# Patient Record
Sex: Female | Born: 1935 | Race: White | Hispanic: No | Smoking: Never smoker
Health system: Southern US, Community
[De-identification: ages and names within clinical notes are randomized; demographics above are authoritative.]

## PROBLEM LIST (undated history)

## (undated) DIAGNOSIS — C801 Malignant (primary) neoplasm, unspecified: Secondary | ICD-10-CM

## (undated) DIAGNOSIS — I2510 Atherosclerotic heart disease of native coronary artery without angina pectoris: Secondary | ICD-10-CM

## (undated) DIAGNOSIS — E7800 Pure hypercholesterolemia, unspecified: Secondary | ICD-10-CM

## (undated) DIAGNOSIS — N189 Chronic kidney disease, unspecified: Secondary | ICD-10-CM

## (undated) DIAGNOSIS — R002 Palpitations: Secondary | ICD-10-CM

## (undated) DIAGNOSIS — R011 Cardiac murmur, unspecified: Secondary | ICD-10-CM

## (undated) DIAGNOSIS — K219 Gastro-esophageal reflux disease without esophagitis: Secondary | ICD-10-CM

## (undated) DIAGNOSIS — K449 Diaphragmatic hernia without obstruction or gangrene: Secondary | ICD-10-CM

## (undated) DIAGNOSIS — M109 Gout, unspecified: Secondary | ICD-10-CM

## (undated) DIAGNOSIS — I10 Essential (primary) hypertension: Secondary | ICD-10-CM

## (undated) HISTORY — PX: OTHER SURGICAL HISTORY: SHX169

## (undated) HISTORY — PX: MASTECTOMY: SHX3

## (undated) HISTORY — PX: LOBECTOMY: SHX5089

## (undated) HISTORY — PX: APPENDECTOMY: SHX54

---

## 2003-05-21 ENCOUNTER — Other Ambulatory Visit: Payer: Self-pay

## 2004-01-25 ENCOUNTER — Ambulatory Visit: Payer: Self-pay | Admitting: Internal Medicine

## 2004-02-01 ENCOUNTER — Emergency Department: Payer: Self-pay | Admitting: Emergency Medicine

## 2004-02-01 ENCOUNTER — Other Ambulatory Visit: Payer: Self-pay

## 2004-04-23 ENCOUNTER — Ambulatory Visit: Payer: Self-pay | Admitting: Family Medicine

## 2004-04-29 ENCOUNTER — Inpatient Hospital Stay: Payer: Self-pay | Admitting: Internal Medicine

## 2004-05-21 ENCOUNTER — Inpatient Hospital Stay: Payer: Self-pay | Admitting: Internal Medicine

## 2004-05-23 ENCOUNTER — Ambulatory Visit: Payer: Self-pay | Admitting: Internal Medicine

## 2004-06-19 ENCOUNTER — Inpatient Hospital Stay (HOSPITAL_COMMUNITY): Admission: AD | Admit: 2004-06-19 | Discharge: 2004-06-22 | Payer: Self-pay | Admitting: Cardiology

## 2004-06-19 ENCOUNTER — Ambulatory Visit: Payer: Self-pay | Admitting: Cardiovascular Disease

## 2004-06-23 ENCOUNTER — Inpatient Hospital Stay (HOSPITAL_COMMUNITY): Admission: EM | Admit: 2004-06-23 | Discharge: 2004-06-26 | Payer: Self-pay | Admitting: *Deleted

## 2004-09-23 ENCOUNTER — Ambulatory Visit: Payer: Self-pay | Admitting: Internal Medicine

## 2004-11-30 ENCOUNTER — Emergency Department: Payer: Self-pay | Admitting: Emergency Medicine

## 2004-12-02 ENCOUNTER — Ambulatory Visit: Payer: Self-pay | Admitting: General Surgery

## 2004-12-02 ENCOUNTER — Ambulatory Visit: Payer: Self-pay | Admitting: Emergency Medicine

## 2005-01-27 ENCOUNTER — Ambulatory Visit: Payer: Self-pay | Admitting: Internal Medicine

## 2005-02-24 ENCOUNTER — Ambulatory Visit: Payer: Self-pay | Admitting: Internal Medicine

## 2005-07-26 ENCOUNTER — Ambulatory Visit: Payer: Self-pay | Admitting: Internal Medicine

## 2005-08-24 ENCOUNTER — Ambulatory Visit: Payer: Self-pay | Admitting: Internal Medicine

## 2005-11-29 ENCOUNTER — Ambulatory Visit: Payer: Self-pay | Admitting: General Surgery

## 2006-01-19 ENCOUNTER — Ambulatory Visit: Payer: Self-pay | Admitting: Internal Medicine

## 2006-01-24 ENCOUNTER — Ambulatory Visit: Payer: Self-pay | Admitting: Internal Medicine

## 2006-06-22 ENCOUNTER — Ambulatory Visit: Payer: Self-pay | Admitting: *Deleted

## 2006-07-09 ENCOUNTER — Emergency Department: Payer: Self-pay | Admitting: General Practice

## 2006-07-18 ENCOUNTER — Ambulatory Visit: Payer: Self-pay | Admitting: Internal Medicine

## 2006-07-26 ENCOUNTER — Ambulatory Visit: Payer: Self-pay | Admitting: Internal Medicine

## 2006-11-30 ENCOUNTER — Ambulatory Visit: Payer: Self-pay | Admitting: General Surgery

## 2007-03-27 ENCOUNTER — Ambulatory Visit: Payer: Self-pay | Admitting: Internal Medicine

## 2007-04-12 ENCOUNTER — Ambulatory Visit: Payer: Self-pay | Admitting: Internal Medicine

## 2007-04-27 ENCOUNTER — Ambulatory Visit: Payer: Self-pay | Admitting: Internal Medicine

## 2007-06-25 ENCOUNTER — Ambulatory Visit: Payer: Self-pay | Admitting: Internal Medicine

## 2007-09-25 ENCOUNTER — Ambulatory Visit: Payer: Self-pay | Admitting: Internal Medicine

## 2007-10-09 ENCOUNTER — Ambulatory Visit: Payer: Self-pay | Admitting: Internal Medicine

## 2007-10-25 ENCOUNTER — Ambulatory Visit: Payer: Self-pay | Admitting: Internal Medicine

## 2007-11-27 ENCOUNTER — Ambulatory Visit: Payer: Self-pay | Admitting: General Surgery

## 2008-01-25 ENCOUNTER — Ambulatory Visit: Payer: Self-pay | Admitting: Internal Medicine

## 2008-03-14 ENCOUNTER — Ambulatory Visit: Payer: Self-pay | Admitting: Family Medicine

## 2008-06-24 ENCOUNTER — Ambulatory Visit: Payer: Self-pay | Admitting: Internal Medicine

## 2008-06-26 ENCOUNTER — Ambulatory Visit: Payer: Self-pay | Admitting: Internal Medicine

## 2008-07-25 ENCOUNTER — Ambulatory Visit: Payer: Self-pay | Admitting: Internal Medicine

## 2008-09-18 ENCOUNTER — Ambulatory Visit: Payer: Self-pay | Admitting: Nephrology

## 2008-12-02 ENCOUNTER — Ambulatory Visit: Payer: Self-pay | Admitting: General Surgery

## 2009-06-10 ENCOUNTER — Ambulatory Visit: Payer: Self-pay | Admitting: Family Medicine

## 2009-08-10 ENCOUNTER — Inpatient Hospital Stay: Payer: Self-pay | Admitting: Internal Medicine

## 2009-12-03 ENCOUNTER — Ambulatory Visit: Payer: Self-pay

## 2011-03-11 ENCOUNTER — Ambulatory Visit: Payer: Self-pay | Admitting: Physician Assistant

## 2011-03-26 ENCOUNTER — Inpatient Hospital Stay: Payer: Self-pay | Admitting: Internal Medicine

## 2011-05-15 ENCOUNTER — Ambulatory Visit: Payer: Self-pay | Admitting: Cardiovascular Disease

## 2011-05-30 ENCOUNTER — Emergency Department: Payer: Self-pay | Admitting: Internal Medicine

## 2011-05-30 LAB — CBC WITH DIFFERENTIAL/PLATELET
Basophil #: 0.1 10*3/uL (ref 0.0–0.1)
Eosinophil #: 0.4 10*3/uL (ref 0.0–0.7)
HCT: 36.8 % (ref 35.0–47.0)
Lymphocyte #: 1.1 10*3/uL (ref 1.0–3.6)
Lymphocyte %: 11 %
MCHC: 33.3 g/dL (ref 32.0–36.0)
MCV: 89 fL (ref 80–100)
Neutrophil #: 7.1 10*3/uL — ABNORMAL HIGH (ref 1.4–6.5)
Platelet: 187 10*3/uL (ref 150–440)
RDW: 15.2 % — ABNORMAL HIGH (ref 11.5–14.5)
WBC: 9.6 10*3/uL (ref 3.6–11.0)

## 2011-05-30 LAB — CK TOTAL AND CKMB (NOT AT ARMC): CK, Total: 51 U/L (ref 21–215)

## 2011-05-30 LAB — COMPREHENSIVE METABOLIC PANEL
Albumin: 3.6 g/dL (ref 3.4–5.0)
Anion Gap: 12 (ref 7–16)
BUN: 43 mg/dL — ABNORMAL HIGH (ref 7–18)
Calcium, Total: 10.6 mg/dL — ABNORMAL HIGH (ref 8.5–10.1)
Glucose: 101 mg/dL — ABNORMAL HIGH (ref 65–99)
Potassium: 4 mmol/L (ref 3.5–5.1)
SGOT(AST): 18 U/L (ref 15–37)
SGPT (ALT): 17 U/L
Total Protein: 8.3 g/dL — ABNORMAL HIGH (ref 6.4–8.2)

## 2011-06-23 ENCOUNTER — Ambulatory Visit: Payer: Self-pay | Admitting: Nephrology

## 2011-06-24 ENCOUNTER — Inpatient Hospital Stay: Payer: Self-pay | Admitting: Physician Assistant

## 2011-06-24 LAB — CBC WITH DIFFERENTIAL/PLATELET
Basophil #: 0 10*3/uL (ref 0.0–0.1)
Eosinophil #: 0.4 10*3/uL (ref 0.0–0.7)
HCT: 28.6 % — ABNORMAL LOW (ref 35.0–47.0)
Lymphocyte #: 0.9 10*3/uL — ABNORMAL LOW (ref 1.0–3.6)
MCHC: 34 g/dL (ref 32.0–36.0)
MCV: 89 fL (ref 80–100)
Monocyte #: 1 10*3/uL — ABNORMAL HIGH (ref 0.0–0.7)
Neutrophil #: 9.8 10*3/uL — ABNORMAL HIGH (ref 1.4–6.5)
Platelet: 271 10*3/uL (ref 150–440)
RDW: 14.6 % — ABNORMAL HIGH (ref 11.5–14.5)

## 2011-06-24 LAB — URINALYSIS, COMPLETE
Bilirubin,UR: NEGATIVE
Ketone: NEGATIVE
Protein: 30
RBC,UR: 188 /HPF (ref 0–5)
Squamous Epithelial: NONE SEEN
WBC UR: 684 /HPF (ref 0–5)

## 2011-06-24 LAB — COMPREHENSIVE METABOLIC PANEL
Albumin: 2.8 g/dL — ABNORMAL LOW (ref 3.4–5.0)
Anion Gap: 11 (ref 7–16)
BUN: 77 mg/dL — ABNORMAL HIGH (ref 7–18)
Calcium, Total: 9.2 mg/dL (ref 8.5–10.1)
Glucose: 95 mg/dL (ref 65–99)
Osmolality: 304 (ref 275–301)
Potassium: 5 mmol/L (ref 3.5–5.1)
Sodium: 141 mmol/L (ref 136–145)
Total Protein: 7.5 g/dL (ref 6.4–8.2)

## 2011-06-25 LAB — COMPREHENSIVE METABOLIC PANEL
Albumin: 2.2 g/dL — ABNORMAL LOW (ref 3.4–5.0)
Anion Gap: 9 (ref 7–16)
Bilirubin,Total: 0.2 mg/dL (ref 0.2–1.0)
Chloride: 112 mmol/L — ABNORMAL HIGH (ref 98–107)
Co2: 20 mmol/L — ABNORMAL LOW (ref 21–32)
Creatinine: 2.88 mg/dL — ABNORMAL HIGH (ref 0.60–1.30)
EGFR (African American): 20 — ABNORMAL LOW
Sodium: 141 mmol/L (ref 136–145)
Total Protein: 6 g/dL — ABNORMAL LOW (ref 6.4–8.2)

## 2011-06-25 LAB — CBC WITH DIFFERENTIAL/PLATELET
Basophil #: 0.1 10*3/uL (ref 0.0–0.1)
Eosinophil %: 3.9 %
HCT: 23.4 % — ABNORMAL LOW (ref 35.0–47.0)
Lymphocyte #: 0.9 10*3/uL — ABNORMAL LOW (ref 1.0–3.6)
MCH: 30.1 pg (ref 26.0–34.0)
MCV: 88 fL (ref 80–100)
Monocyte %: 9 %
Neutrophil #: 7.8 10*3/uL — ABNORMAL HIGH (ref 1.4–6.5)
RDW: 14.1 % (ref 11.5–14.5)
WBC: 10.1 10*3/uL (ref 3.6–11.0)

## 2011-06-25 LAB — IRON AND TIBC: Iron Saturation: 16 %

## 2011-06-26 LAB — CBC WITH DIFFERENTIAL/PLATELET
Eosinophil #: 0.3 10*3/uL (ref 0.0–0.7)
HCT: 24 % — ABNORMAL LOW (ref 35.0–47.0)
Lymphocyte #: 0.8 10*3/uL — ABNORMAL LOW (ref 1.0–3.6)
Lymphocyte %: 7.7 %
MCHC: 33.6 g/dL (ref 32.0–36.0)
Monocyte %: 8.2 %
Neutrophil #: 8.1 10*3/uL — ABNORMAL HIGH (ref 1.4–6.5)
Platelet: 190 10*3/uL (ref 150–440)
RDW: 13.8 % (ref 11.5–14.5)
WBC: 10.1 10*3/uL (ref 3.6–11.0)

## 2011-06-26 LAB — BASIC METABOLIC PANEL
Anion Gap: 8 (ref 7–16)
Chloride: 115 mmol/L — ABNORMAL HIGH (ref 98–107)
Co2: 20 mmol/L — ABNORMAL LOW (ref 21–32)
Creatinine: 2.29 mg/dL — ABNORMAL HIGH (ref 0.60–1.30)
Osmolality: 298 (ref 275–301)
Potassium: 5.2 mmol/L — ABNORMAL HIGH (ref 3.5–5.1)

## 2011-06-27 LAB — BASIC METABOLIC PANEL
Calcium, Total: 8.4 mg/dL — ABNORMAL LOW (ref 8.5–10.1)
Chloride: 118 mmol/L — ABNORMAL HIGH (ref 98–107)
Co2: 21 mmol/L (ref 21–32)
EGFR (African American): 33 — ABNORMAL LOW
Potassium: 4.4 mmol/L (ref 3.5–5.1)

## 2011-06-27 LAB — CBC WITH DIFFERENTIAL/PLATELET
Basophil #: 0 10*3/uL (ref 0.0–0.1)
HCT: 23 % — ABNORMAL LOW (ref 35.0–47.0)
Lymphocyte #: 0.8 10*3/uL — ABNORMAL LOW (ref 1.0–3.6)
MCH: 29.9 pg (ref 26.0–34.0)
MCV: 89 fL (ref 80–100)
Monocyte #: 0.7 10*3/uL (ref 0.0–0.7)
Monocyte %: 8.6 %
Neutrophil #: 6.2 10*3/uL (ref 1.4–6.5)
Platelet: 179 10*3/uL (ref 150–440)
RBC: 2.57 10*6/uL — ABNORMAL LOW (ref 3.80–5.20)
RDW: 14.2 % (ref 11.5–14.5)
WBC: 7.9 10*3/uL (ref 3.6–11.0)

## 2011-06-28 LAB — BASIC METABOLIC PANEL
Calcium, Total: 8.7 mg/dL (ref 8.5–10.1)
Chloride: 115 mmol/L — ABNORMAL HIGH (ref 98–107)
Creatinine: 1.71 mg/dL — ABNORMAL HIGH (ref 0.60–1.30)
EGFR (Non-African Amer.): 30 — ABNORMAL LOW
Glucose: 79 mg/dL (ref 65–99)
Osmolality: 298 (ref 275–301)
Potassium: 4.2 mmol/L (ref 3.5–5.1)

## 2011-06-28 LAB — CBC WITH DIFFERENTIAL/PLATELET
Basophil %: 0.6 %
Eosinophil #: 0.3 10*3/uL (ref 0.0–0.7)
Eosinophil %: 4.1 %
HCT: 30.1 % — ABNORMAL LOW (ref 35.0–47.0)
HGB: 10.2 g/dL — ABNORMAL LOW (ref 12.0–16.0)
Lymphocyte #: 1 10*3/uL (ref 1.0–3.6)
MCH: 29.8 pg (ref 26.0–34.0)
MCHC: 34 g/dL (ref 32.0–36.0)
MCV: 88 fL (ref 80–100)
Monocyte #: 0.8 10*3/uL — ABNORMAL HIGH (ref 0.0–0.7)
Neutrophil #: 5.1 10*3/uL (ref 1.4–6.5)
RBC: 3.43 10*6/uL — ABNORMAL LOW (ref 3.80–5.20)
WBC: 7.3 10*3/uL (ref 3.6–11.0)

## 2011-06-29 LAB — BASIC METABOLIC PANEL
Anion Gap: 7 (ref 7–16)
BUN: 22 mg/dL — ABNORMAL HIGH (ref 7–18)
Calcium, Total: 8.8 mg/dL (ref 8.5–10.1)
Chloride: 107 mmol/L (ref 98–107)
Creatinine: 1.61 mg/dL — ABNORMAL HIGH (ref 0.60–1.30)
EGFR (Non-African Amer.): 32 — ABNORMAL LOW
Osmolality: 282 (ref 275–301)
Potassium: 3.9 mmol/L (ref 3.5–5.1)

## 2011-06-29 LAB — CBC WITH DIFFERENTIAL/PLATELET
Eosinophil #: 0.3 10*3/uL (ref 0.0–0.7)
Eosinophil %: 3.7 %
HCT: 29.7 % — ABNORMAL LOW (ref 35.0–47.0)
Lymphocyte #: 0.9 10*3/uL — ABNORMAL LOW (ref 1.0–3.6)
Lymphocyte %: 11.4 %
MCV: 89 fL (ref 80–100)
Monocyte %: 12 %
Neutrophil #: 6 10*3/uL (ref 1.4–6.5)
Platelet: 144 10*3/uL — ABNORMAL LOW (ref 150–440)
RBC: 3.35 10*6/uL — ABNORMAL LOW (ref 3.80–5.20)
RDW: 13.8 % (ref 11.5–14.5)
WBC: 8.3 10*3/uL (ref 3.6–11.0)

## 2011-06-30 LAB — CULTURE, BLOOD (SINGLE)

## 2011-07-07 ENCOUNTER — Ambulatory Visit: Payer: Self-pay | Admitting: Urology

## 2011-07-07 LAB — PROTIME-INR
INR: 1
Prothrombin Time: 13.9 secs (ref 11.5–14.7)

## 2011-07-11 ENCOUNTER — Ambulatory Visit: Payer: Self-pay | Admitting: Urology

## 2011-07-11 LAB — URINALYSIS, COMPLETE
Bilirubin,UR: NEGATIVE
Ketone: NEGATIVE
Ph: 5 (ref 4.5–8.0)
Protein: 100
Squamous Epithelial: 5

## 2011-07-11 LAB — COMPREHENSIVE METABOLIC PANEL
Anion Gap: 10 (ref 7–16)
Calcium, Total: 8.7 mg/dL (ref 8.5–10.1)
Chloride: 99 mmol/L (ref 98–107)
Co2: 32 mmol/L (ref 21–32)
EGFR (African American): 32 — ABNORMAL LOW
EGFR (Non-African Amer.): 26 — ABNORMAL LOW
Glucose: 123 mg/dL — ABNORMAL HIGH (ref 65–99)
Potassium: 3.3 mmol/L — ABNORMAL LOW (ref 3.5–5.1)
SGOT(AST): 25 U/L (ref 15–37)

## 2011-07-11 LAB — CBC
HCT: 31.3 % — ABNORMAL LOW (ref 35.0–47.0)
HGB: 10.4 g/dL — ABNORMAL LOW (ref 12.0–16.0)
MCHC: 33.3 g/dL (ref 32.0–36.0)
MCV: 90 fL (ref 80–100)
RDW: 13 % (ref 11.5–14.5)
WBC: 13.4 10*3/uL — ABNORMAL HIGH (ref 3.6–11.0)

## 2011-07-11 LAB — LIPASE, BLOOD: Lipase: 135 U/L (ref 73–393)

## 2011-07-11 LAB — TROPONIN I: Troponin-I: 0.1 ng/mL — ABNORMAL HIGH

## 2011-07-12 LAB — BASIC METABOLIC PANEL
Anion Gap: 6 — ABNORMAL LOW (ref 7–16)
BUN: 25 mg/dL — ABNORMAL HIGH (ref 7–18)
Calcium, Total: 8.9 mg/dL (ref 8.5–10.1)
Chloride: 100 mmol/L (ref 98–107)
Co2: 32 mmol/L (ref 21–32)
Creatinine: 1.65 mg/dL — ABNORMAL HIGH (ref 0.60–1.30)
EGFR (African American): 38 — ABNORMAL LOW
EGFR (Non-African Amer.): 31 — ABNORMAL LOW
Glucose: 97 mg/dL (ref 65–99)
Osmolality: 280 (ref 275–301)
Potassium: 3.5 mmol/L (ref 3.5–5.1)
Sodium: 138 mmol/L (ref 136–145)

## 2011-07-13 ENCOUNTER — Inpatient Hospital Stay: Payer: Self-pay | Admitting: Internal Medicine

## 2011-07-13 LAB — CBC WITH DIFFERENTIAL/PLATELET
Basophil %: 0 %
Eosinophil %: 0.4 %
HCT: 29.6 % — ABNORMAL LOW (ref 35.0–47.0)
HGB: 10 g/dL — ABNORMAL LOW (ref 12.0–16.0)
MCH: 30.2 pg (ref 26.0–34.0)
MCHC: 33.8 g/dL (ref 32.0–36.0)
MCV: 90 fL (ref 80–100)
Monocyte #: 1.6 10*3/uL — ABNORMAL HIGH (ref 0.0–0.7)
Neutrophil #: 13.1 10*3/uL — ABNORMAL HIGH (ref 1.4–6.5)
Neutrophil %: 86.5 %
RBC: 3.31 10*6/uL — ABNORMAL LOW (ref 3.80–5.20)

## 2011-07-13 LAB — BASIC METABOLIC PANEL
Anion Gap: 13 (ref 7–16)
BUN: 20 mg/dL — ABNORMAL HIGH (ref 7–18)
Co2: 28 mmol/L (ref 21–32)
Creatinine: 1.89 mg/dL — ABNORMAL HIGH (ref 0.60–1.30)
EGFR (African American): 32 — ABNORMAL LOW
Glucose: 140 mg/dL — ABNORMAL HIGH (ref 65–99)

## 2011-07-13 LAB — MAGNESIUM: Magnesium: 1.4 mg/dL — ABNORMAL LOW

## 2011-07-14 LAB — BASIC METABOLIC PANEL
Calcium, Total: 8.4 mg/dL — ABNORMAL LOW (ref 8.5–10.1)
Co2: 28 mmol/L (ref 21–32)
EGFR (African American): 32 — ABNORMAL LOW
EGFR (Non-African Amer.): 27 — ABNORMAL LOW
Glucose: 135 mg/dL — ABNORMAL HIGH (ref 65–99)
Osmolality: 283 (ref 275–301)

## 2011-07-14 LAB — URINE CULTURE

## 2011-07-14 LAB — CBC WITH DIFFERENTIAL/PLATELET
Eosinophil #: 0.3 10*3/uL (ref 0.0–0.7)
Eosinophil %: 2.3 %
MCH: 29.8 pg (ref 26.0–34.0)
Monocyte #: 1.1 10*3/uL — ABNORMAL HIGH (ref 0.0–0.7)
Neutrophil %: 84.4 %
Platelet: 159 10*3/uL (ref 150–440)
RBC: 3.55 10*6/uL — ABNORMAL LOW (ref 3.80–5.20)

## 2011-07-15 LAB — CBC WITH DIFFERENTIAL/PLATELET
Basophil #: 0 10*3/uL (ref 0.0–0.1)
Eosinophil %: 3.5 %
Lymphocyte #: 0.5 10*3/uL — ABNORMAL LOW (ref 1.0–3.6)
MCH: 30.1 pg (ref 26.0–34.0)
MCV: 89 fL (ref 80–100)
Monocyte #: 1.1 10*3/uL — ABNORMAL HIGH (ref 0.0–0.7)
Neutrophil #: 9.6 10*3/uL — ABNORMAL HIGH (ref 1.4–6.5)
Platelet: 167 10*3/uL (ref 150–440)
RDW: 13.5 % (ref 11.5–14.5)

## 2011-07-15 LAB — BASIC METABOLIC PANEL
Anion Gap: 10 (ref 7–16)
Calcium, Total: 8.2 mg/dL — ABNORMAL LOW (ref 8.5–10.1)
Co2: 26 mmol/L (ref 21–32)
Creatinine: 1.66 mg/dL — ABNORMAL HIGH (ref 0.60–1.30)
EGFR (African American): 38 — ABNORMAL LOW
EGFR (Non-African Amer.): 31 — ABNORMAL LOW
Glucose: 104 mg/dL — ABNORMAL HIGH (ref 65–99)

## 2011-07-15 LAB — URINALYSIS, COMPLETE
Bacteria: NONE SEEN
WBC UR: 4 /HPF (ref 0–5)

## 2011-07-16 LAB — CBC WITH DIFFERENTIAL/PLATELET
Basophil #: 0 10*3/uL (ref 0.0–0.1)
Eosinophil #: 0.4 10*3/uL (ref 0.0–0.7)
HCT: 30.2 % — ABNORMAL LOW (ref 35.0–47.0)
Lymphocyte #: 0.7 10*3/uL — ABNORMAL LOW (ref 1.0–3.6)
MCH: 29.7 pg (ref 26.0–34.0)
MCHC: 33.1 g/dL (ref 32.0–36.0)
Monocyte #: 0.9 10*3/uL — ABNORMAL HIGH (ref 0.0–0.7)
Neutrophil %: 79.2 %
Platelet: 193 10*3/uL (ref 150–440)
RBC: 3.36 10*6/uL — ABNORMAL LOW (ref 3.80–5.20)
RDW: 13.4 % (ref 11.5–14.5)
WBC: 10.3 10*3/uL (ref 3.6–11.0)

## 2011-07-16 LAB — BASIC METABOLIC PANEL
Anion Gap: 12 (ref 7–16)
Calcium, Total: 8.3 mg/dL — ABNORMAL LOW (ref 8.5–10.1)
Co2: 26 mmol/L (ref 21–32)
Creatinine: 1.41 mg/dL — ABNORMAL HIGH (ref 0.60–1.30)
EGFR (African American): 45 — ABNORMAL LOW
EGFR (Non-African Amer.): 37 — ABNORMAL LOW
Glucose: 110 mg/dL — ABNORMAL HIGH (ref 65–99)
Osmolality: 286 (ref 275–301)
Potassium: 4.7 mmol/L (ref 3.5–5.1)

## 2011-07-17 LAB — CBC WITH DIFFERENTIAL/PLATELET
Basophil #: 0 10*3/uL (ref 0.0–0.1)
Basophil %: 0.3 %
Eosinophil %: 4.4 %
HCT: 30.1 % — ABNORMAL LOW (ref 35.0–47.0)
HGB: 10.2 g/dL — ABNORMAL LOW (ref 12.0–16.0)
Lymphocyte #: 0.5 10*3/uL — ABNORMAL LOW (ref 1.0–3.6)
Lymphocyte %: 4.8 %
MCH: 30.1 pg (ref 26.0–34.0)
MCV: 89 fL (ref 80–100)
Monocyte #: 0.9 10*3/uL — ABNORMAL HIGH (ref 0.0–0.7)
Monocyte %: 8.9 %
Neutrophil #: 7.9 10*3/uL — ABNORMAL HIGH (ref 1.4–6.5)
Platelet: 210 10*3/uL (ref 150–440)
RBC: 3.38 10*6/uL — ABNORMAL LOW (ref 3.80–5.20)
WBC: 9.6 10*3/uL (ref 3.6–11.0)

## 2011-07-17 LAB — BASIC METABOLIC PANEL
Anion Gap: 6 — ABNORMAL LOW (ref 7–16)
BUN: 12 mg/dL (ref 7–18)
Calcium, Total: 8.3 mg/dL — ABNORMAL LOW (ref 8.5–10.1)
Chloride: 104 mmol/L (ref 98–107)
Creatinine: 1.34 mg/dL — ABNORMAL HIGH (ref 0.60–1.30)
Glucose: 98 mg/dL (ref 65–99)
Osmolality: 274 (ref 275–301)
Potassium: 4.6 mmol/L (ref 3.5–5.1)
Sodium: 137 mmol/L (ref 136–145)

## 2011-07-17 LAB — URINE CULTURE

## 2011-07-18 LAB — BASIC METABOLIC PANEL
BUN: 13 mg/dL (ref 7–18)
Calcium, Total: 8.4 mg/dL — ABNORMAL LOW (ref 8.5–10.1)
Chloride: 106 mmol/L (ref 98–107)
Co2: 26 mmol/L (ref 21–32)
EGFR (African American): 45 — ABNORMAL LOW
Osmolality: 283 (ref 275–301)
Potassium: 4.7 mmol/L (ref 3.5–5.1)

## 2011-07-18 LAB — CBC WITH DIFFERENTIAL/PLATELET
Basophil #: 0 10*3/uL (ref 0.0–0.1)
Basophil %: 0.3 %
HCT: 29.5 % — ABNORMAL LOW (ref 35.0–47.0)
HGB: 9.9 g/dL — ABNORMAL LOW (ref 12.0–16.0)
Lymphocyte #: 0.7 10*3/uL — ABNORMAL LOW (ref 1.0–3.6)
Lymphocyte %: 6.6 %
MCHC: 33.6 g/dL (ref 32.0–36.0)
MCV: 89 fL (ref 80–100)
Monocyte #: 0.9 10*3/uL — ABNORMAL HIGH (ref 0.0–0.7)
Monocyte %: 8.3 %
Neutrophil #: 8.9 10*3/uL — ABNORMAL HIGH (ref 1.4–6.5)
RBC: 3.33 10*6/uL — ABNORMAL LOW (ref 3.80–5.20)
RDW: 13.3 % (ref 11.5–14.5)
WBC: 11 10*3/uL (ref 3.6–11.0)

## 2011-07-21 LAB — CULTURE, BLOOD (SINGLE)

## 2011-08-04 ENCOUNTER — Ambulatory Visit: Payer: Self-pay | Admitting: Urology

## 2011-08-05 ENCOUNTER — Ambulatory Visit: Payer: Self-pay | Admitting: Urology

## 2011-08-12 ENCOUNTER — Inpatient Hospital Stay: Payer: Self-pay | Admitting: Internal Medicine

## 2011-08-12 LAB — URINALYSIS, COMPLETE
Bacteria: NONE SEEN
Glucose,UR: NEGATIVE mg/dL (ref 0–75)
Ph: 6 (ref 4.5–8.0)
Protein: 30
RBC,UR: 35 /HPF (ref 0–5)
Specific Gravity: 1.012 (ref 1.003–1.030)
Squamous Epithelial: 2

## 2011-08-12 LAB — COMPREHENSIVE METABOLIC PANEL
BUN: 38 mg/dL — ABNORMAL HIGH (ref 7–18)
Chloride: 103 mmol/L (ref 98–107)
EGFR (Non-African Amer.): 17 — ABNORMAL LOW
Glucose: 105 mg/dL — ABNORMAL HIGH (ref 65–99)
Osmolality: 289 (ref 275–301)
Potassium: 4.4 mmol/L (ref 3.5–5.1)
SGPT (ALT): 11 U/L — ABNORMAL LOW
Sodium: 140 mmol/L (ref 136–145)
Total Protein: 7 g/dL (ref 6.4–8.2)

## 2011-08-12 LAB — CBC
HGB: 9.5 g/dL — ABNORMAL LOW (ref 12.0–16.0)
MCH: 29.2 pg (ref 26.0–34.0)
MCHC: 32.8 g/dL (ref 32.0–36.0)
MCV: 89 fL (ref 80–100)

## 2011-08-13 LAB — CBC WITH DIFFERENTIAL/PLATELET
Basophil %: 0.8 %
MCH: 29.3 pg (ref 26.0–34.0)
MCHC: 33.3 g/dL (ref 32.0–36.0)
MCV: 88 fL (ref 80–100)
Monocyte #: 0.7 x10 3/mm (ref 0.2–0.9)
Monocyte %: 15.7 %
Neutrophil #: 2.7 10*3/uL (ref 1.4–6.5)
Platelet: 129 10*3/uL — ABNORMAL LOW (ref 150–440)
RDW: 14.7 % — ABNORMAL HIGH (ref 11.5–14.5)
WBC: 4.4 10*3/uL (ref 3.6–11.0)

## 2011-08-13 LAB — BASIC METABOLIC PANEL
BUN: 25 mg/dL — ABNORMAL HIGH (ref 7–18)
Calcium, Total: 8.5 mg/dL (ref 8.5–10.1)
Co2: 28 mmol/L (ref 21–32)
EGFR (African American): 29 — ABNORMAL LOW
EGFR (Non-African Amer.): 25 — ABNORMAL LOW
Osmolality: 287 (ref 275–301)
Sodium: 142 mmol/L (ref 136–145)

## 2011-08-14 LAB — CBC WITH DIFFERENTIAL/PLATELET
Basophil #: 0.1 10*3/uL (ref 0.0–0.1)
Basophil %: 1.1 %
Eosinophil #: 0.2 10*3/uL (ref 0.0–0.7)
Eosinophil %: 4.3 %
HGB: 8.9 g/dL — ABNORMAL LOW (ref 12.0–16.0)
Lymphocyte %: 19.2 %
MCHC: 32.9 g/dL (ref 32.0–36.0)
MCV: 89 fL (ref 80–100)
Monocyte #: 0.7 x10 3/mm (ref 0.2–0.9)
Monocyte %: 14.4 %
Neutrophil #: 3.1 10*3/uL (ref 1.4–6.5)
Neutrophil %: 61 %
RDW: 14.7 % — ABNORMAL HIGH (ref 11.5–14.5)
WBC: 5 10*3/uL (ref 3.6–11.0)

## 2011-08-14 LAB — MAGNESIUM: Magnesium: 2.1 mg/dL

## 2011-08-14 LAB — BASIC METABOLIC PANEL
Anion Gap: 9 (ref 7–16)
Chloride: 110 mmol/L — ABNORMAL HIGH (ref 98–107)
Co2: 25 mmol/L (ref 21–32)
EGFR (Non-African Amer.): 33 — ABNORMAL LOW
Glucose: 88 mg/dL (ref 65–99)
Osmolality: 288 (ref 275–301)
Sodium: 144 mmol/L (ref 136–145)

## 2011-08-15 LAB — BASIC METABOLIC PANEL
Anion Gap: 7 (ref 7–16)
BUN: 17 mg/dL (ref 7–18)
Calcium, Total: 8 mg/dL — ABNORMAL LOW (ref 8.5–10.1)
EGFR (Non-African Amer.): 38 — ABNORMAL LOW
Glucose: 96 mg/dL (ref 65–99)
Potassium: 3.9 mmol/L (ref 3.5–5.1)
Sodium: 143 mmol/L (ref 136–145)

## 2011-08-15 LAB — CBC WITH DIFFERENTIAL/PLATELET
Basophil #: 0 10*3/uL (ref 0.0–0.1)
HCT: 25.7 % — ABNORMAL LOW (ref 35.0–47.0)
Lymphocyte #: 1 10*3/uL (ref 1.0–3.6)
Monocyte %: 12.6 %
Neutrophil #: 3.1 10*3/uL (ref 1.4–6.5)
RDW: 14.8 % — ABNORMAL HIGH (ref 11.5–14.5)
WBC: 4.9 10*3/uL (ref 3.6–11.0)

## 2011-08-16 LAB — BASIC METABOLIC PANEL
Anion Gap: 8 (ref 7–16)
BUN: 14 mg/dL (ref 7–18)
Calcium, Total: 8.3 mg/dL — ABNORMAL LOW (ref 8.5–10.1)
Creatinine: 1.15 mg/dL (ref 0.60–1.30)
EGFR (African American): 50 — ABNORMAL LOW
Potassium: 4.1 mmol/L (ref 3.5–5.1)
Sodium: 143 mmol/L (ref 136–145)

## 2011-08-16 LAB — CBC WITH DIFFERENTIAL/PLATELET
Basophil #: 0.1 10*3/uL (ref 0.0–0.1)
Basophil %: 0.9 %
Eosinophil %: 2.6 %
HCT: 27.7 % — ABNORMAL LOW (ref 35.0–47.0)
Lymphocyte %: 15.7 %
MCHC: 33 g/dL (ref 32.0–36.0)
Monocyte #: 0.8 x10 3/mm (ref 0.2–0.9)
Monocyte %: 11.8 %
RBC: 3.14 10*6/uL — ABNORMAL LOW (ref 3.80–5.20)
RDW: 14.7 % — ABNORMAL HIGH (ref 11.5–14.5)

## 2011-08-17 LAB — BASIC METABOLIC PANEL
Anion Gap: 7 (ref 7–16)
BUN: 12 mg/dL (ref 7–18)
Chloride: 110 mmol/L — ABNORMAL HIGH (ref 98–107)
EGFR (African American): 44 — ABNORMAL LOW
Sodium: 142 mmol/L (ref 136–145)

## 2011-08-17 LAB — CBC WITH DIFFERENTIAL/PLATELET
Basophil %: 0.8 %
Eosinophil #: 0.2 10*3/uL (ref 0.0–0.7)
HGB: 8.4 g/dL — ABNORMAL LOW (ref 12.0–16.0)
MCH: 29.7 pg (ref 26.0–34.0)
Monocyte #: 0.6 x10 3/mm (ref 0.2–0.9)
Monocyte %: 11.6 %
Neutrophil %: 66.3 %
Platelet: 117 10*3/uL — ABNORMAL LOW (ref 150–440)
WBC: 5.5 10*3/uL (ref 3.6–11.0)

## 2011-08-18 LAB — CULTURE, BLOOD (SINGLE)

## 2011-08-30 ENCOUNTER — Ambulatory Visit: Payer: Self-pay | Admitting: Urology

## 2011-08-31 LAB — CK TOTAL AND CKMB (NOT AT ARMC)
CK, Total: 17 U/L — ABNORMAL LOW (ref 21–215)
CK-MB: <0.5 ng/mL — ABNORMAL LOW (ref 0.5–3.6)

## 2011-08-31 LAB — CBC
MCH: 30.5 pg (ref 26.0–34.0)
MCV: 91 fL (ref 80–100)
RBC: 3.34 10*6/uL — ABNORMAL LOW (ref 3.80–5.20)
RDW: 16.7 % — ABNORMAL HIGH (ref 11.5–14.5)
WBC: 7.6 10*3/uL (ref 3.6–11.0)

## 2011-08-31 LAB — COMPREHENSIVE METABOLIC PANEL
Albumin: 2.6 g/dL — ABNORMAL LOW (ref 3.4–5.0)
Alkaline Phosphatase: 84 U/L (ref 50–136)
BUN: 13 mg/dL (ref 7–18)
Calcium, Total: 8.6 mg/dL (ref 8.5–10.1)
Co2: 25 mmol/L (ref 21–32)
Glucose: 99 mg/dL (ref 65–99)
SGPT (ALT): 13 U/L
Sodium: 140 mmol/L (ref 136–145)

## 2011-09-01 ENCOUNTER — Inpatient Hospital Stay: Payer: Self-pay | Admitting: Internal Medicine

## 2011-09-01 LAB — TROPONIN I
Troponin-I: 0.03 ng/mL
Troponin-I: 0.02 ng/mL

## 2011-09-01 LAB — URINALYSIS, COMPLETE
Ketone: NEGATIVE
Nitrite: NEGATIVE
Protein: 100
Specific Gravity: 1.017 (ref 1.003–1.030)
Squamous Epithelial: 1
WBC UR: 375 /HPF (ref 0–5)

## 2011-09-01 LAB — CK TOTAL AND CKMB (NOT AT ARMC)
CK, Total: 16 U/L — ABNORMAL LOW (ref 21–215)
CK-MB: <0.5 ng/mL — ABNORMAL LOW (ref 0.5–3.6)
CK-MB: <0.5 ng/mL — ABNORMAL LOW (ref 0.5–3.6)

## 2011-09-02 LAB — CBC WITH DIFFERENTIAL/PLATELET
Basophil #: 0 10*3/uL (ref 0.0–0.1)
Basophil %: 0.7 %
Eosinophil #: 0.1 10*3/uL (ref 0.0–0.7)
HCT: 26.8 % — ABNORMAL LOW (ref 35.0–47.0)
Lymphocyte #: 1.3 10*3/uL (ref 1.0–3.6)
MCV: 90 fL (ref 80–100)
Monocyte #: 0.6 x10 3/mm (ref 0.2–0.9)
Neutrophil #: 2.5 10*3/uL (ref 1.4–6.5)
RBC: 2.97 10*6/uL — ABNORMAL LOW (ref 3.80–5.20)
RDW: 16.8 % — ABNORMAL HIGH (ref 11.5–14.5)
WBC: 4.6 10*3/uL (ref 3.6–11.0)

## 2011-09-02 LAB — BASIC METABOLIC PANEL
Anion Gap: 8 (ref 7–16)
BUN: 18 mg/dL (ref 7–18)
Chloride: 96 mmol/L — ABNORMAL LOW (ref 98–107)
Co2: 25 mmol/L (ref 21–32)
Creatinine: 1.32 mg/dL — ABNORMAL HIGH (ref 0.60–1.30)
EGFR (African American): 42 — ABNORMAL LOW
EGFR (Non-African Amer.): 36 — ABNORMAL LOW
Osmolality: 274 (ref 275–301)

## 2011-09-03 LAB — CBC WITH DIFFERENTIAL/PLATELET
Eosinophil #: 0.1 10*3/uL (ref 0.0–0.7)
HGB: 8.2 g/dL — ABNORMAL LOW (ref 12.0–16.0)
Lymphocyte #: 1 10*3/uL (ref 1.0–3.6)
Lymphocyte %: 31.1 %
MCH: 30.1 pg (ref 26.0–34.0)
MCHC: 33.6 g/dL (ref 32.0–36.0)
Monocyte %: 17.5 %
Platelet: 132 10*3/uL — ABNORMAL LOW (ref 150–440)
WBC: 3.3 10*3/uL — ABNORMAL LOW (ref 3.6–11.0)

## 2011-09-03 LAB — BASIC METABOLIC PANEL
BUN: 15 mg/dL (ref 7–18)
Calcium, Total: 8.4 mg/dL — ABNORMAL LOW (ref 8.5–10.1)
Chloride: 106 mmol/L (ref 98–107)
Co2: 29 mmol/L (ref 21–32)
Creatinine: 1.16 mg/dL (ref 0.60–1.30)
EGFR (Non-African Amer.): 42 — ABNORMAL LOW
Glucose: 82 mg/dL (ref 65–99)
Osmolality: 281 (ref 275–301)
Potassium: 3.6 mmol/L (ref 3.5–5.1)
Sodium: 141 mmol/L (ref 136–145)

## 2011-09-03 LAB — FERRITIN: Ferritin (ARMC): 239 ng/mL (ref 8–388)

## 2011-09-06 LAB — CULTURE, BLOOD (SINGLE)

## 2012-04-30 ENCOUNTER — Observation Stay: Payer: Self-pay | Admitting: Internal Medicine

## 2012-04-30 LAB — CBC
MCH: 30.7 pg (ref 26.0–34.0)
MCHC: 34.1 g/dL (ref 32.0–36.0)
MCV: 90 fL (ref 80–100)
Platelet: 170 10*3/uL (ref 150–440)
RBC: 4.21 10*6/uL (ref 3.80–5.20)
RDW: 13.2 % (ref 11.5–14.5)

## 2012-04-30 LAB — URINALYSIS, COMPLETE
Bacteria: NONE SEEN
Bilirubin,UR: NEGATIVE
Blood: NEGATIVE
Glucose,UR: NEGATIVE mg/dL (ref 0–75)
Ketone: NEGATIVE
Leukocyte Esterase: NEGATIVE
Nitrite: NEGATIVE
Protein: 30
RBC,UR: 3 /HPF (ref 0–5)

## 2012-04-30 LAB — COMPREHENSIVE METABOLIC PANEL
Alkaline Phosphatase: 85 U/L (ref 50–136)
Calcium, Total: 9.9 mg/dL (ref 8.5–10.1)
Chloride: 105 mmol/L (ref 98–107)
Co2: 28 mmol/L (ref 21–32)
Creatinine: 1.16 mg/dL (ref 0.60–1.30)
EGFR (African American): 49 — ABNORMAL LOW
EGFR (Non-African Amer.): 42 — ABNORMAL LOW
Potassium: 4 mmol/L (ref 3.5–5.1)
SGPT (ALT): 18 U/L (ref 12–78)
Sodium: 141 mmol/L (ref 136–145)
Total Protein: 7 g/dL (ref 6.4–8.2)

## 2012-04-30 LAB — CK TOTAL AND CKMB (NOT AT ARMC): CK-MB: 1.3 ng/mL (ref 0.5–3.6)

## 2012-05-01 LAB — CBC WITH DIFFERENTIAL/PLATELET
HCT: 32.4 % — ABNORMAL LOW (ref 35.0–47.0)
Lymphocyte #: 1.3 10*3/uL (ref 1.0–3.6)
Lymphocyte %: 18 %
MCH: 30.4 pg (ref 26.0–34.0)
MCV: 91 fL (ref 80–100)
Monocyte #: 0.8 x10 3/mm (ref 0.2–0.9)
Monocyte %: 10.9 %
Neutrophil #: 4.6 10*3/uL (ref 1.4–6.5)
Neutrophil %: 65.2 %
RBC: 3.56 10*6/uL — ABNORMAL LOW (ref 3.80–5.20)
RDW: 12.5 % (ref 11.5–14.5)
WBC: 7.1 10*3/uL (ref 3.6–11.0)

## 2012-05-01 LAB — BASIC METABOLIC PANEL
Anion Gap: 7 (ref 7–16)
BUN: 27 mg/dL — ABNORMAL HIGH (ref 7–18)
Chloride: 109 mmol/L — ABNORMAL HIGH (ref 98–107)
EGFR (Non-African Amer.): 43 — ABNORMAL LOW
Osmolality: 291 (ref 275–301)
Potassium: 4.2 mmol/L (ref 3.5–5.1)
Sodium: 144 mmol/L (ref 136–145)

## 2012-05-01 LAB — T4, FREE: Free Thyroxine: 1.13 ng/dL (ref 0.76–1.46)

## 2012-09-29 IMAGING — CT CT STONE STUDY
1 of 2 series · 14 of 32 positions shown, 18 images · non-contrast
Comparison: none

REASON FOR EXAM: hematuria
COMMENTS:

[Series 2: stonelg 3.0 i40s 3 · axial · 0.82mm/px · z∈[-973,-574]mm · 14 of 151 slices shown, 18 images]
[im 12/151  soft-tissue]
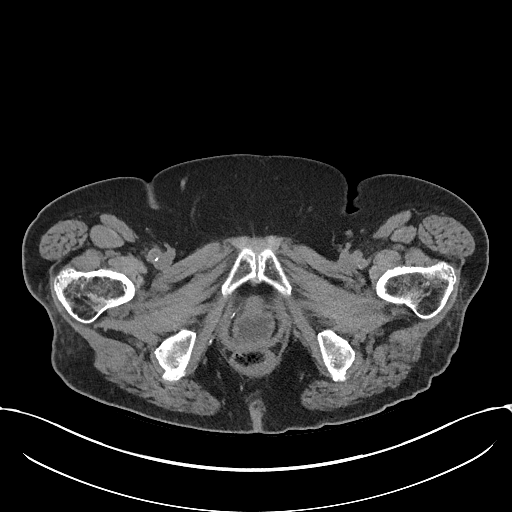
[im 12/151  bone]
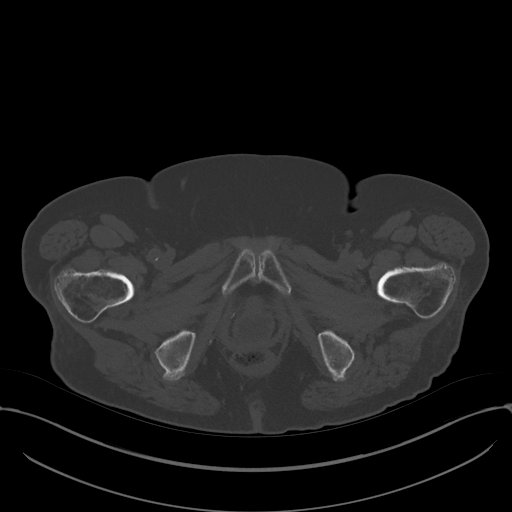
[im 23/151  soft-tissue]
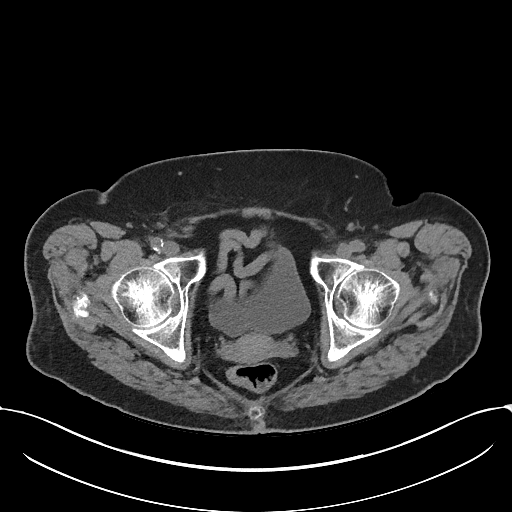
[im 34/151  soft-tissue]
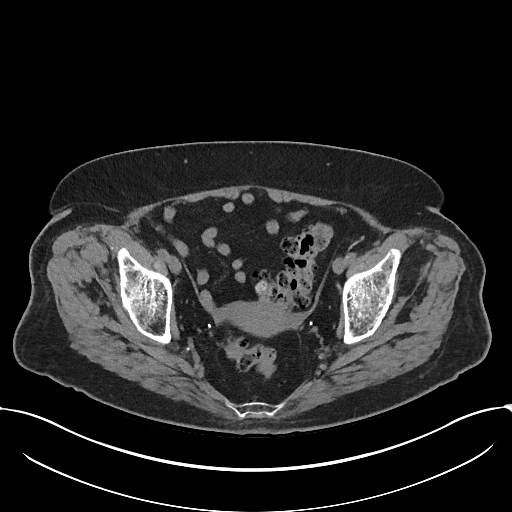
[im 45/151  soft-tissue]
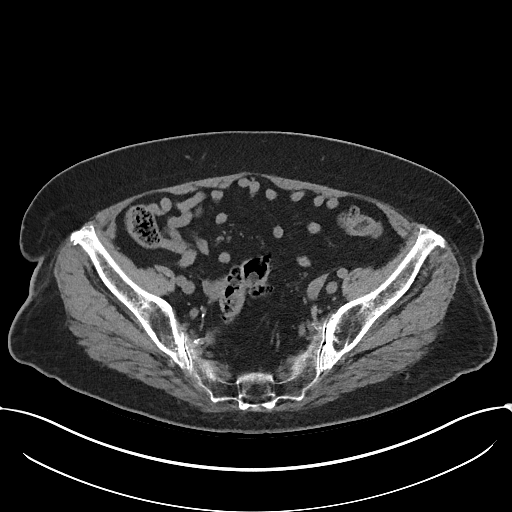
[im 56/151  soft-tissue]
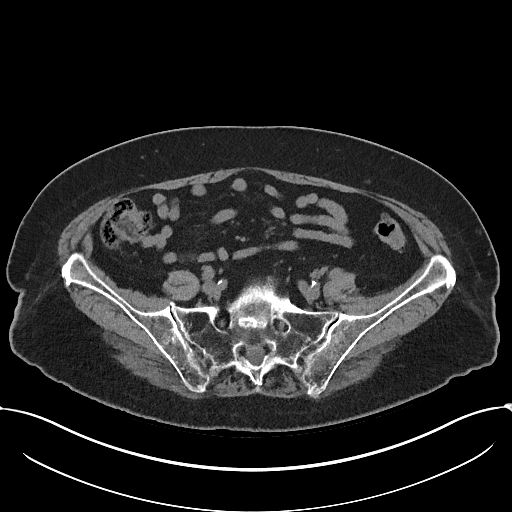
[im 67/151  soft-tissue]
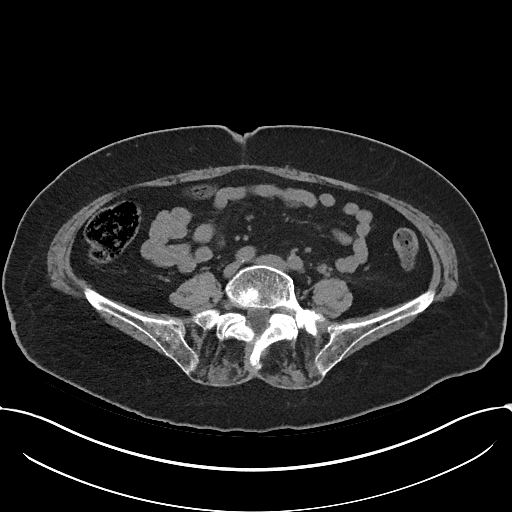
[im 84/151  soft-tissue]
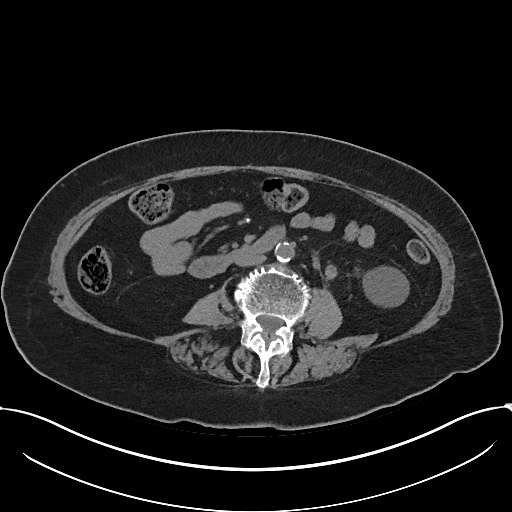
[im 95/151  soft-tissue]
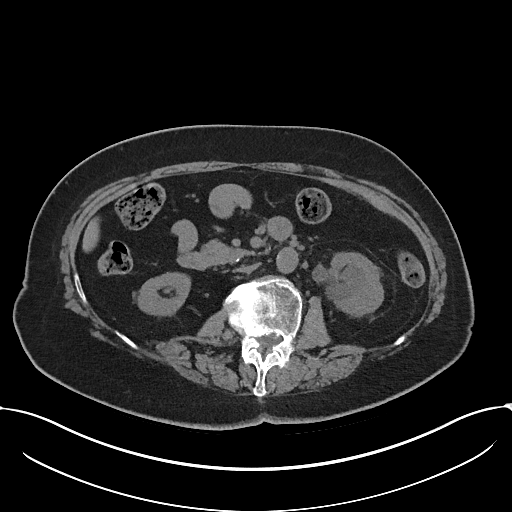
[im 106/151  soft-tissue]
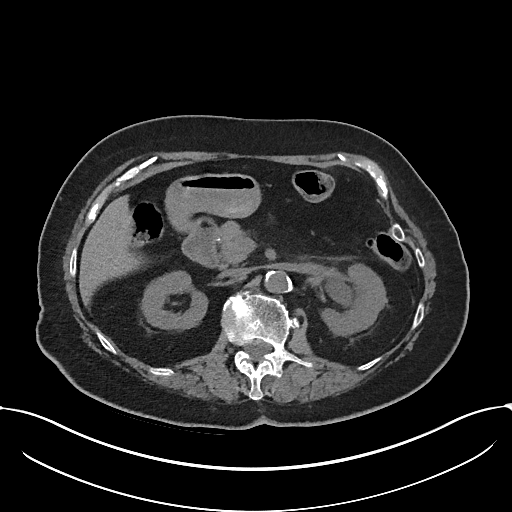
[im 106/151  bone]
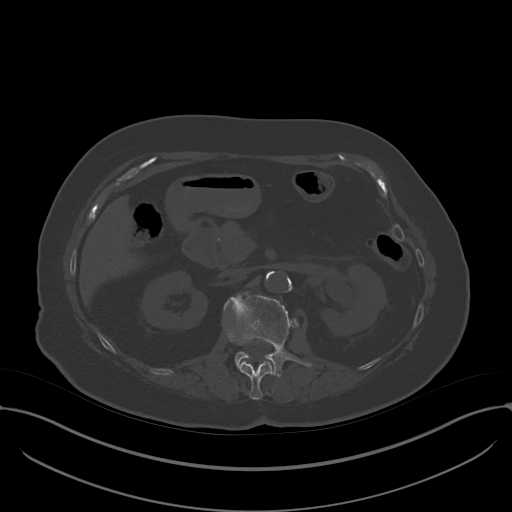
[im 117/151  soft-tissue]
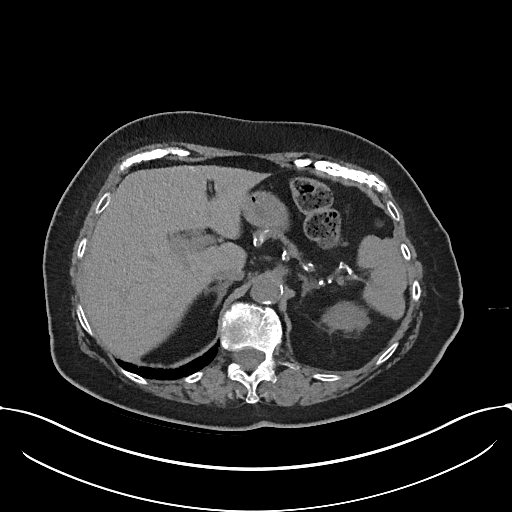
[im 128/151  soft-tissue]
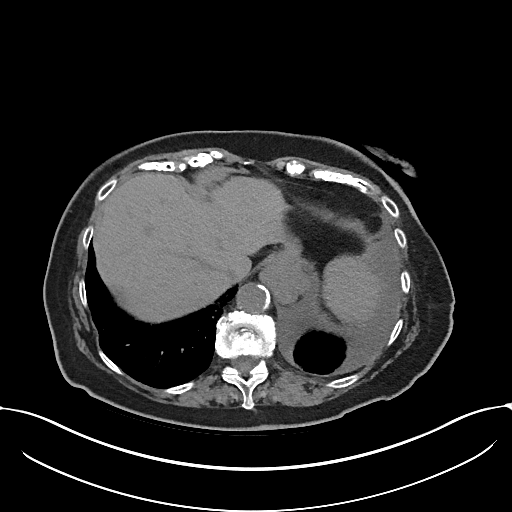
[im 128/151  lung]
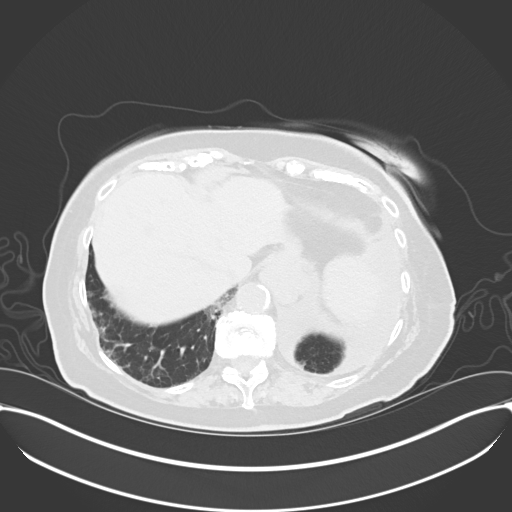
[im 134/151  lung]
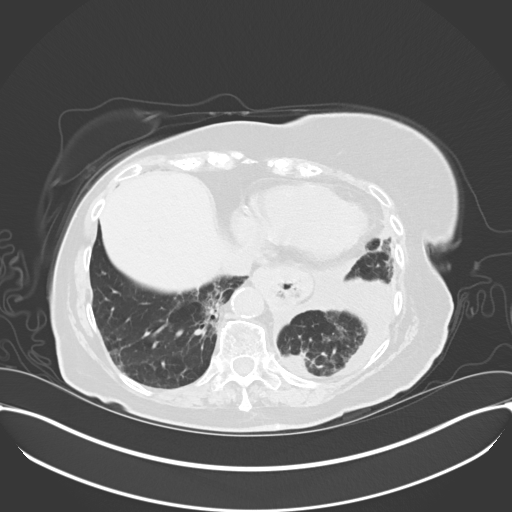
[im 139/151  soft-tissue]
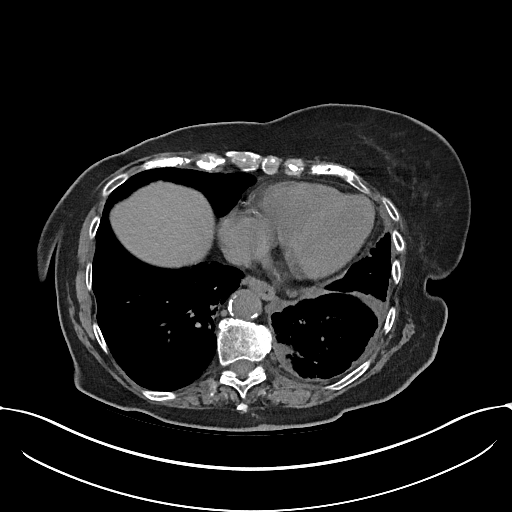
[im 139/151  lung]
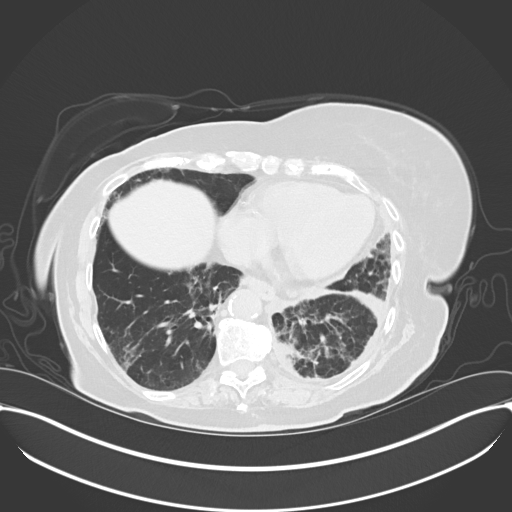
[im 145/151  lung]
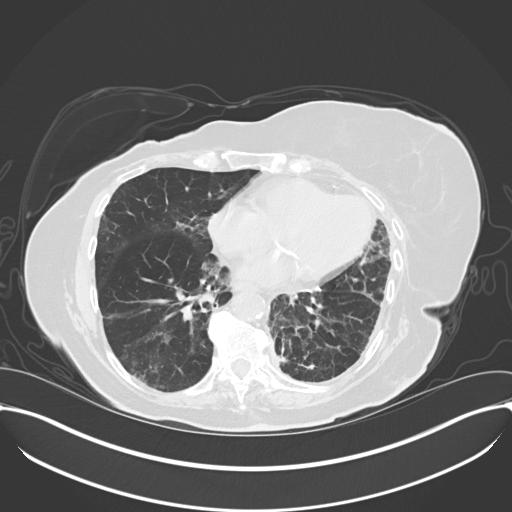

[14 of 32 positions shown; findings below may reference images not displayed]

PROCEDURE:     KCT - KCT ABDOMEN/PELVIS WO (STONE)  - June 23, 2011 [DATE]

RESULT:     Renal stone protocol CT of the abdomen and pelvis is
reconstructed at 3.0 mm slice thickness and compared images from a previous
CT dated 10 August, 2009.

The previous left renal stone is now in the proximal left ureter causing
moderate proximal hydroureter and hydronephrosis. Stone measures is much as
9.0 mm in diameter on image 71. There is periureteric stranding and some
perinephric stranding. An additional calcific density in the left kidney is
noted on image 42 and may represent smaller nonobstructing stone or vascular
calcification. There is an exophytic low-attenuation lesion from the lower
pole the left kidney with a Hounsfield reading of 9.0 suggestive of a stable
left renal cyst measuring up to 4.1 cm in diameter. Minimal calcification in
the right renal hilum suggests atherosclerotic calcification on image 55 and
56. No right urinary obstruction or definite stones are evident.

There is extensive sigmoid colonic diverticular disease without evidence of
acute diverticulitis. Diverticulosis is seen in the descending colon to a
lesser extent. A moderately large hiatal hernia is present. Left pleural
effusion is present. Some basilar fibrosis or atelectasis is present at the
left lung base with some presumed fibrosis present at both lung bases.

Noncontrast images of the gallbladder show no definite stones area the
liver, spleen, pancreas, adrenal glands and aorta appear to be grossly
normal aside from atherosclerotic disease. The abdominal wall is intact.
Surgical clips are seen adjacent to the cecum suggestive of previous
appendectomy. Correlate with surgical history. There is no abnormal bowel
distention or evidence of perforation. There is no ascites. A tiny fat
filled umbilical hernia is noted.
IMPRESSION: 1. Moderate degree of proximal hydroureter and hydronephrosis proximal to a
9 mm stone in the mid left ureter just above the level of the iliac crest.
Urologic consultation is recommended. The requesting physician was paged
during the dictation of this report but as of the time of sign off report
had not returned the page.
2. Colonic diverticular disease without evidence of acute diverticulitis.
3. Lung base areas of atelectasis or fibrosis on the left with a small left
pleural effusion that appears partially loculated. Basilar fibrosis present
in both lungs.(*)

## 2012-10-17 IMAGING — CT CT ABD-PELV W/O CM
1 of 2 series · 15 of 32 positions shown, 19 images · non-contrast
Comparison: None

REASON FOR EXAM: (1) Left Abd Pain with nausea and h/o stones; (2) Left
Abd Pain with nausea, h/o
COMMENTS:   LMP: Post-Menopausal

PROCEDURE:     CT  - CT ABDOMEN AND PELVIS W[DATE]  [DATE]
RESULT:     Indication: Left-sided abdominal pain
TECHNIQUE: Multiple axial images from the lung bases to the symphysis pubis
were obtained without oral and without intravenous contrast.

[Series 2: 3mm soft tissue · axial · 0.71mm/px · z∈[-412,+38]mm · 15 of 164 slices shown, 19 images]
[im 7/164  soft-tissue]
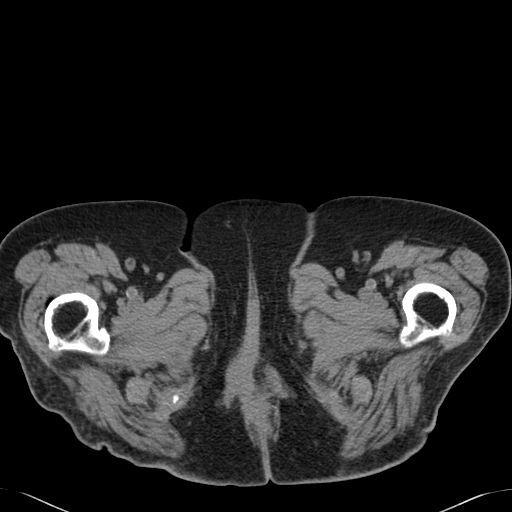
[im 7/164  bone]
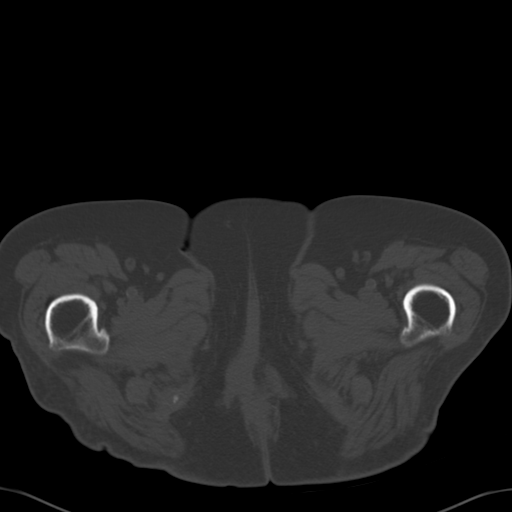
[im 21/164  soft-tissue]
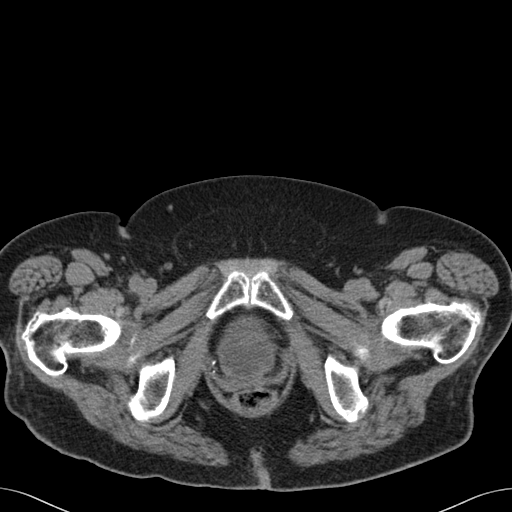
[im 34/164  soft-tissue]
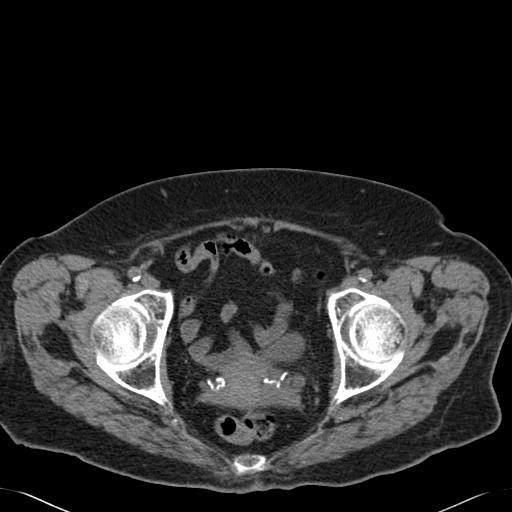
[im 48/164  soft-tissue]
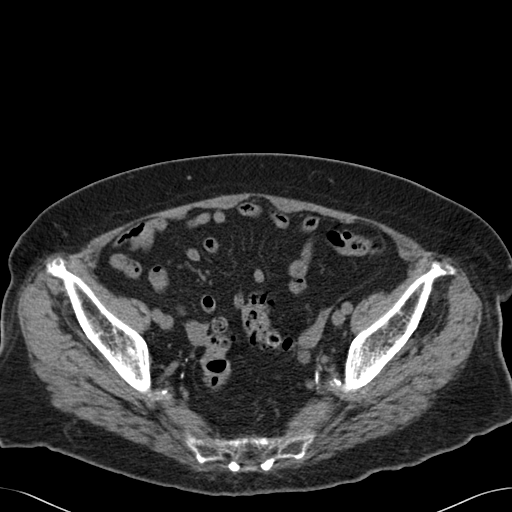
[im 55/164  soft-tissue]
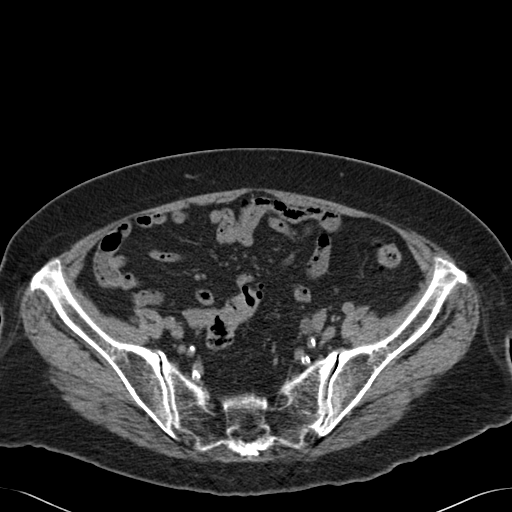
[im 68/164  soft-tissue]
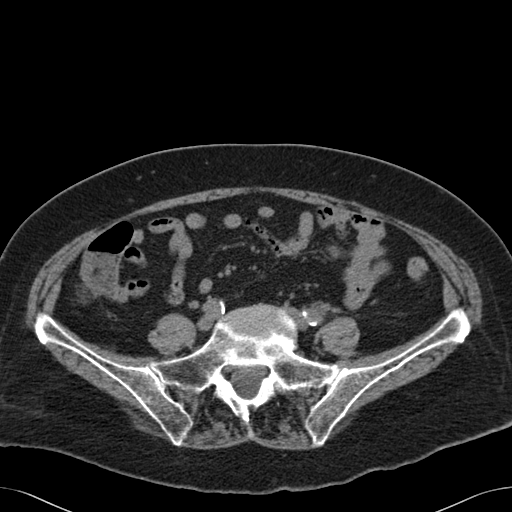
[im 82/164  soft-tissue]
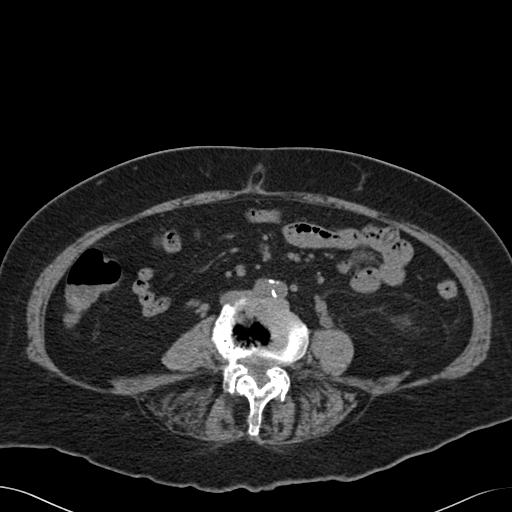
[im 96/164  soft-tissue]
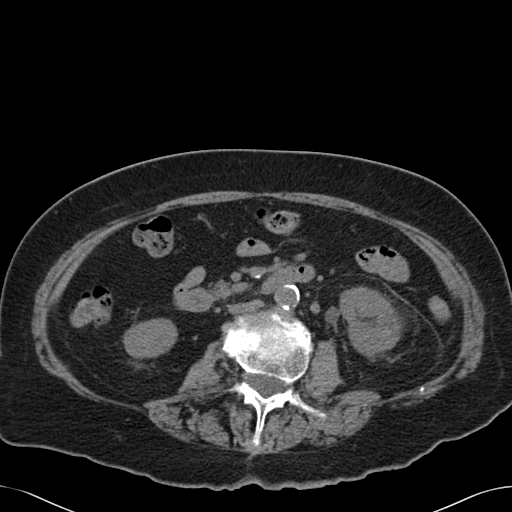
[im 109/164  soft-tissue]
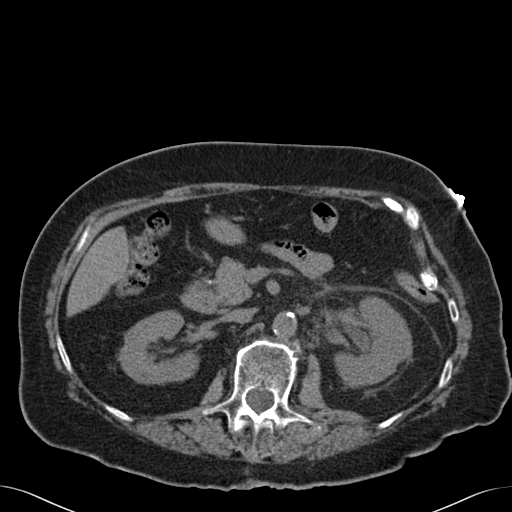
[im 109/164  bone]
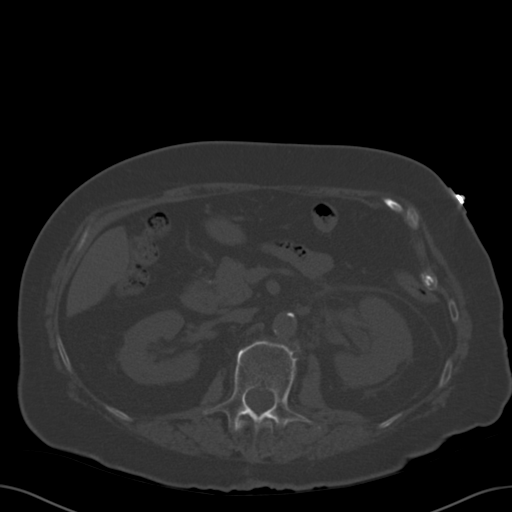
[im 116/164  soft-tissue]
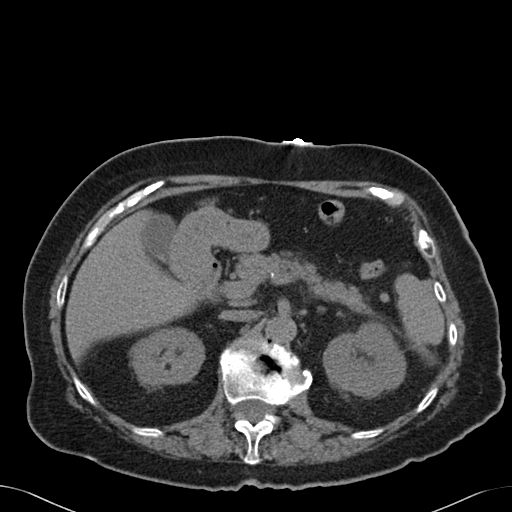
[im 130/164  soft-tissue]
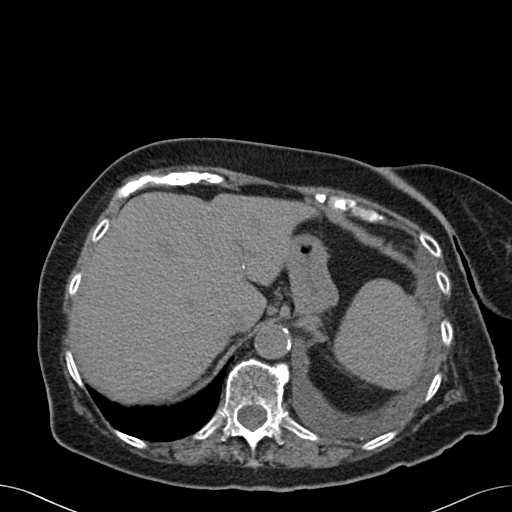
[im 136/164  lung]
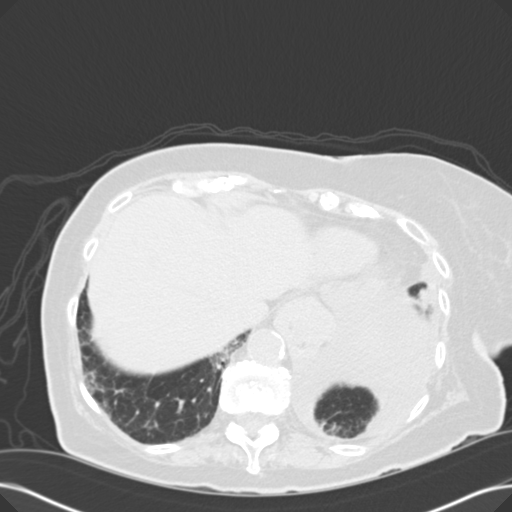
[im 143/164  soft-tissue]
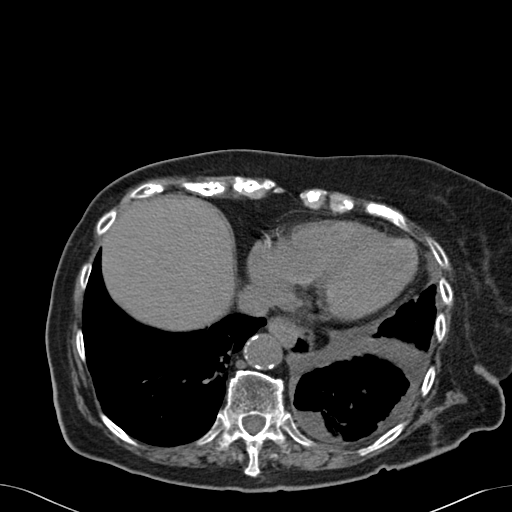
[im 143/164  lung]
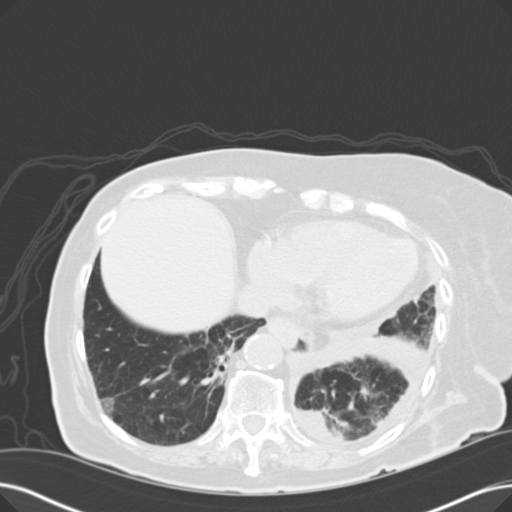
[im 150/164  lung]
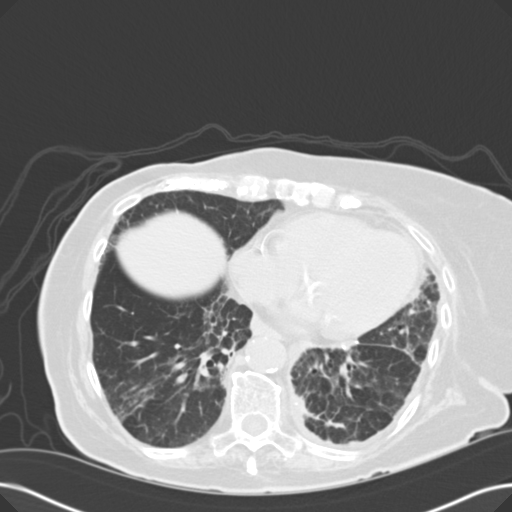
[im 157/164  soft-tissue]
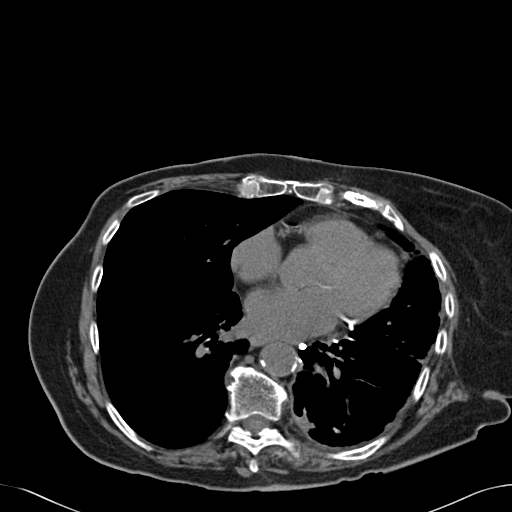
[im 157/164  lung]
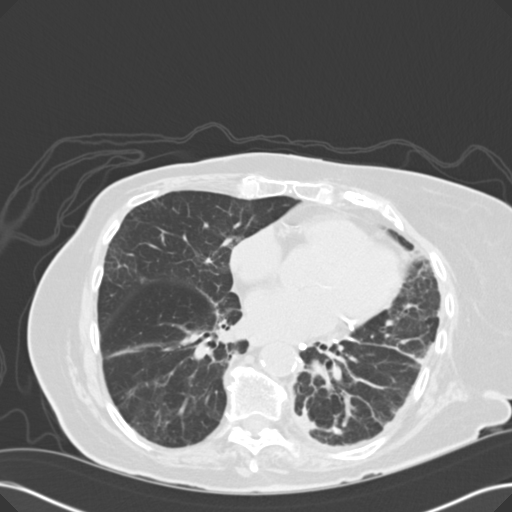

[15 of 32 positions shown; findings below may reference images not displayed]

FINDINGS: There is a small left pleural effusion with areas of loculation. There is
bibasilar fibrosis. There is no pleural or pericardial effusions.

There is a 9 mm distal left ureteral calculus just proximal to the UVJ.
There is a second 7 mm the distal left ureteral calculus just proximal to
the UVJ. There is a 3 mm calculus at the left UVJ. There is moderate left
hydronephrosis. There are 2 hypodense fluid attenuating left renal masses
most consistent with cysts. The bladder is unremarkable.

The liver demonstrates no focal abnormality. The gallbladder is
unremarkable. The spleen demonstrates no focal abnormality. The adrenal
glands and pancreas are normal.

There is a moderate-sized hiatal hernia. The unopacified duodenum, small
intestine, and large intestine are unremarkable, but evaluation is limited
by lack of oral contrast. There is diverticulosis without evidence of
diverticulitis. a There is no pneumoperitoneum, pneumatosis, or portal
venous gas. There is no abdominal or pelvic free fluid. There is no
lymphadenopathy.

The abdominal aorta is normal in caliber with atherosclerosis.

The osseous structures are unremarkable.
IMPRESSION: 1. There is a 9 mm distal left ureteral calculus just proximal to the UVJ.
There is a second 7 mm the distal left ureteral calculus just proximal to
the UVJ. There is a 3 mm calculus at the left UVJ. There is moderate left
hydronephrosis.

2. Small left pleural effusion and with areas of loculation.

## 2012-11-11 IMAGING — CR DG ABDOMEN 1V
1 series · 1 of 1 positions shown · non-contrast
Comparison: none

REASON FOR EXAM: Calculus
COMMENTS:

[supine kub]
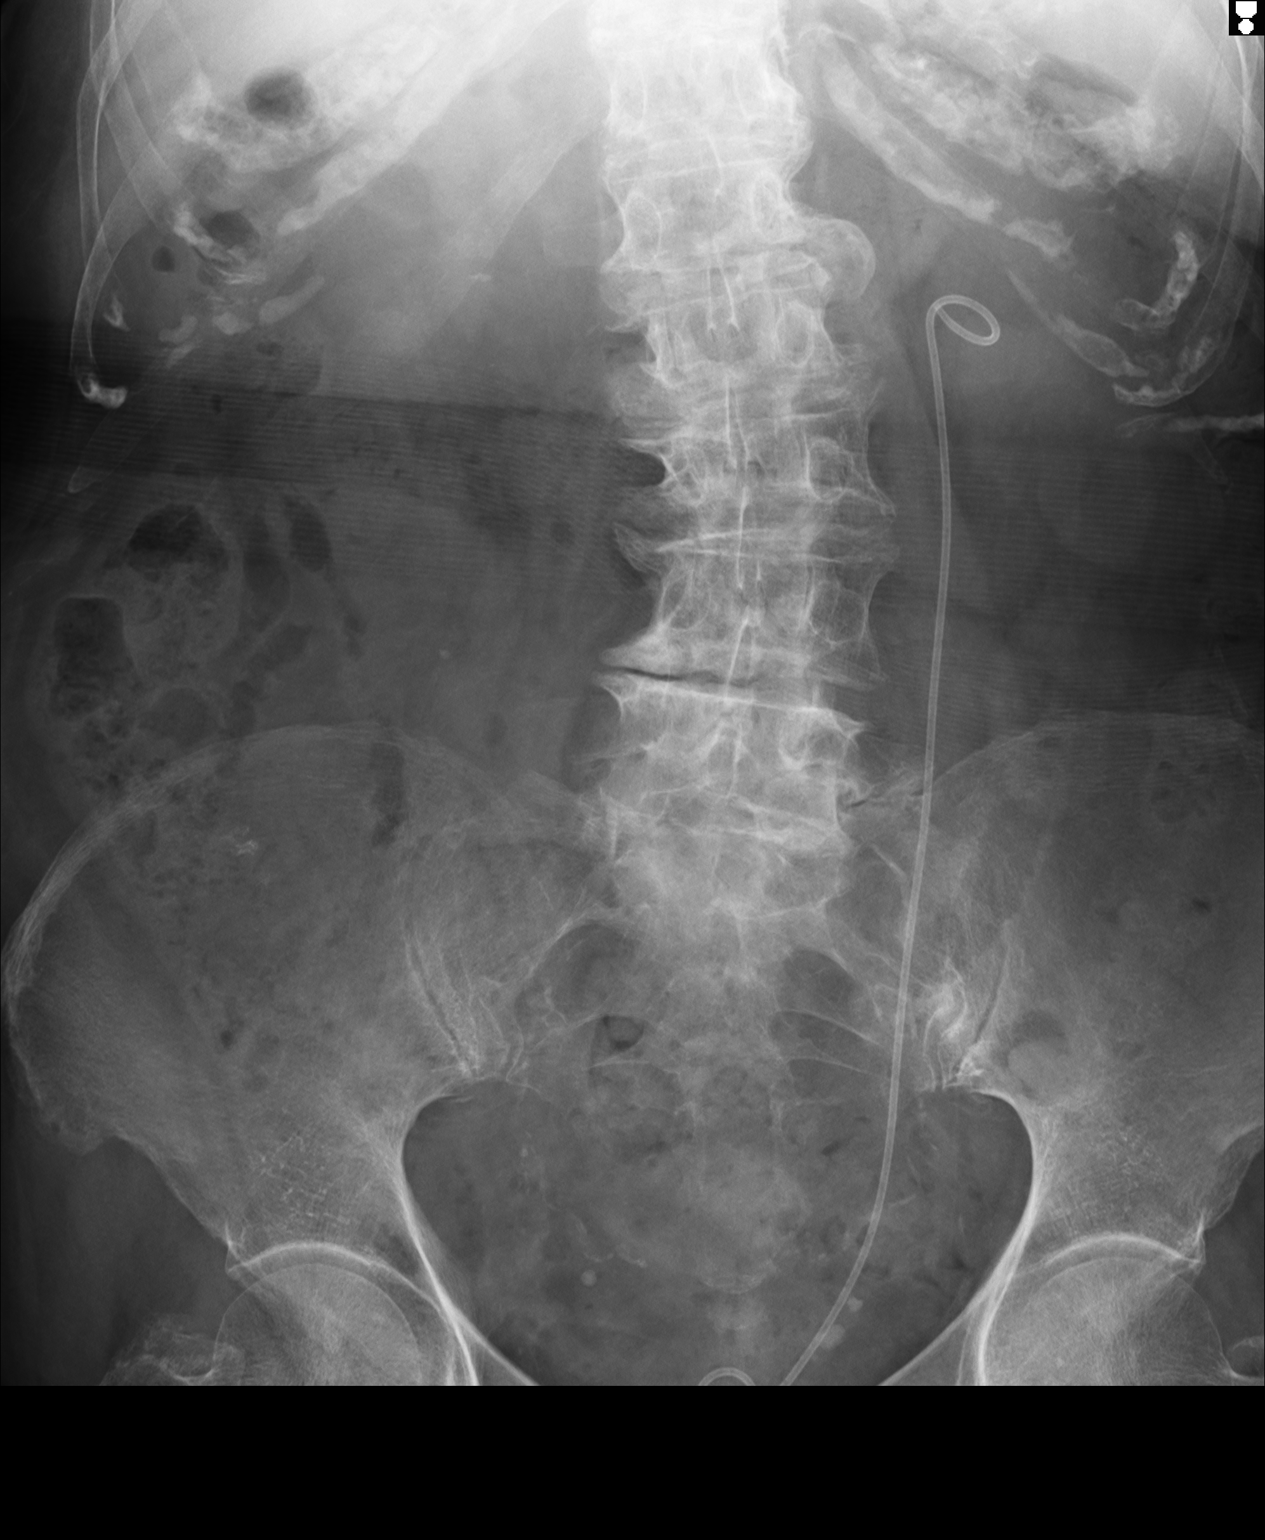

[1 of 1 positions shown; findings below may reference images not displayed]

PROCEDURE:     DXR - DXR KIDNEY URETER BLADDER  - August 05, 2011 [DATE]

RESULT:     An AP view of the abdomen shows a double J left ureteral stent
to be present. There are two calcifications projected over the inferior
aspect of the stent compatible with distal left ureteral stones. No definite
left renal stones are identified. There is a calcification projected over
the right kidney suspicious for a right renal stone. There are degenerative
changes of the lumbar spine and a mild lumbar scoliosis. There is a moderate
amount of fecal material in the colon.
IMPRESSION: 1. A double-J left ureteral stent is present.
2. There are two calcifications in the left pelvic area consistent with
distal left ureteral stones.
3. Probable right nephrolithiasis.
4. There are degenerative changes of the lumbar spine.

## 2012-11-26 ENCOUNTER — Emergency Department: Payer: Self-pay | Admitting: Emergency Medicine

## 2012-11-26 LAB — CBC WITH DIFFERENTIAL/PLATELET
Basophil #: 0.1 10*3/uL (ref 0.0–0.1)
Basophil %: 1.2 %
Eosinophil %: 3.7 %
HCT: 35.1 % (ref 35.0–47.0)
Lymphocyte %: 6.3 %
Monocyte #: 0.6 x10 3/mm (ref 0.2–0.9)
Monocyte %: 6.4 %
Neutrophil #: 7.6 10*3/uL — ABNORMAL HIGH (ref 1.4–6.5)
Neutrophil %: 82.4 %
RBC: 3.9 10*6/uL (ref 3.80–5.20)
RDW: 13.2 % (ref 11.5–14.5)
WBC: 9.2 10*3/uL (ref 3.6–11.0)

## 2012-11-26 LAB — COMPREHENSIVE METABOLIC PANEL
BUN: 25 mg/dL — ABNORMAL HIGH (ref 7–18)
Calcium, Total: 9.1 mg/dL (ref 8.5–10.1)
EGFR (Non-African Amer.): 39 — ABNORMAL LOW
Osmolality: 287 (ref 275–301)
SGOT(AST): 22 U/L (ref 15–37)
SGPT (ALT): 19 U/L (ref 12–78)
Sodium: 141 mmol/L (ref 136–145)

## 2014-01-02 ENCOUNTER — Emergency Department: Payer: Self-pay | Admitting: Emergency Medicine

## 2014-01-02 LAB — BASIC METABOLIC PANEL
ANION GAP: 10 (ref 7–16)
BUN: 23 mg/dL — AB (ref 7–18)
CALCIUM: 8.8 mg/dL (ref 8.5–10.1)
CHLORIDE: 109 mmol/L — AB (ref 98–107)
Co2: 25 mmol/L (ref 21–32)
Creatinine: 1.21 mg/dL (ref 0.60–1.30)
EGFR (African American): 46 — ABNORMAL LOW
GFR CALC NON AF AMER: 39 — AB
GLUCOSE: 89 mg/dL (ref 65–99)
OSMOLALITY: 290 (ref 275–301)
POTASSIUM: 3.6 mmol/L (ref 3.5–5.1)
SODIUM: 144 mmol/L (ref 136–145)

## 2014-01-02 LAB — CBC
HCT: 35.4 % (ref 35.0–47.0)
HGB: 11.9 g/dL — ABNORMAL LOW (ref 12.0–16.0)
MCH: 31.2 pg (ref 26.0–34.0)
MCHC: 33.7 g/dL (ref 32.0–36.0)
MCV: 92 fL (ref 80–100)
Platelet: 128 10*3/uL — ABNORMAL LOW (ref 150–440)
RBC: 3.83 10*6/uL (ref 3.80–5.20)
RDW: 13.1 % (ref 11.5–14.5)
WBC: 7.7 10*3/uL (ref 3.6–11.0)

## 2014-01-02 LAB — TROPONIN I
Troponin-I: <0.02 ng/mL
Troponin-I: <0.02 ng/mL

## 2014-01-02 LAB — D-DIMER(ARMC): D-Dimer: 733 ng/ml

## 2014-03-19 ENCOUNTER — Emergency Department: Payer: Self-pay | Admitting: Emergency Medicine

## 2014-03-19 LAB — CBC WITH DIFFERENTIAL/PLATELET
BASOS ABS: 0.1 10*3/uL (ref 0.0–0.1)
Basophil %: 1.2 %
Eosinophil #: 0.3 10*3/uL (ref 0.0–0.7)
Eosinophil %: 4 %
HCT: 37 % (ref 35.0–47.0)
HGB: 12.2 g/dL (ref 12.0–16.0)
LYMPHS PCT: 12.4 %
Lymphocyte #: 0.9 10*3/uL — ABNORMAL LOW (ref 1.0–3.6)
MCH: 30.9 pg (ref 26.0–34.0)
MCHC: 33 g/dL (ref 32.0–36.0)
MCV: 93 fL (ref 80–100)
Monocyte #: 0.7 x10 3/mm (ref 0.2–0.9)
Monocyte %: 10.2 %
NEUTROS ABS: 5.1 10*3/uL (ref 1.4–6.5)
Neutrophil %: 72.2 %
Platelet: 144 10*3/uL — ABNORMAL LOW (ref 150–440)
RBC: 3.96 10*6/uL (ref 3.80–5.20)
RDW: 13.2 % (ref 11.5–14.5)
WBC: 7.1 10*3/uL (ref 3.6–11.0)

## 2014-03-19 LAB — URINALYSIS, COMPLETE
Bacteria: NONE SEEN
Bilirubin,UR: NEGATIVE
Blood: NEGATIVE
GLUCOSE, UR: NEGATIVE mg/dL (ref 0–75)
KETONE: NEGATIVE
Leukocyte Esterase: NEGATIVE
Nitrite: NEGATIVE
PROTEIN: NEGATIVE
Ph: 5 (ref 4.5–8.0)
Specific Gravity: 1.018 (ref 1.003–1.030)
WBC UR: <1 /HPF (ref 0–5)

## 2014-03-19 LAB — COMPREHENSIVE METABOLIC PANEL
ALK PHOS: 99 U/L
ANION GAP: 7 (ref 7–16)
Albumin: 3.4 g/dL (ref 3.4–5.0)
BUN: 24 mg/dL — ABNORMAL HIGH (ref 7–18)
Bilirubin,Total: 0.6 mg/dL (ref 0.2–1.0)
CALCIUM: 9.1 mg/dL (ref 8.5–10.1)
Chloride: 110 mmol/L — ABNORMAL HIGH (ref 98–107)
Co2: 26 mmol/L (ref 21–32)
Creatinine: 1.23 mg/dL (ref 0.60–1.30)
EGFR (African American): 53 — ABNORMAL LOW
EGFR (Non-African Amer.): 44 — ABNORMAL LOW
Glucose: 92 mg/dL (ref 65–99)
Osmolality: 289 (ref 275–301)
Potassium: 4 mmol/L (ref 3.5–5.1)
SGOT(AST): 30 U/L (ref 15–37)
SGPT (ALT): 19 U/L
SODIUM: 143 mmol/L (ref 136–145)
TOTAL PROTEIN: 7.2 g/dL (ref 6.4–8.2)

## 2014-03-19 LAB — TROPONIN I: Troponin-I: <0.02 ng/mL

## 2014-03-19 LAB — LIPASE, BLOOD: Lipase: 184 U/L (ref 73–393)

## 2014-03-19 LAB — APTT: ACTIVATED PTT: 54.9 s — AB (ref 23.6–35.9)

## 2014-03-19 LAB — PROTIME-INR
INR: 1
Prothrombin Time: 13.5 secs (ref 11.5–14.7)

## 2014-08-16 NOTE — Consult Note (Signed)
PATIENT NAME:  [name] [name], [name] MR#:  [id number] DATE OF BIRTH:  1921-06-21  DATE OF CONSULTATION:  05/01/2012  REFERRING PHYSICIAN: Loletha Grayer, MD CONSULTING PHYSICIAN:  [name] Riches, PA-C  PRIMARY CARE PHYSICIAN: [name] Dupre, MD  REASON FOR CONSULTATION: Chest pain.   HISTORY OF PRESENT ILLNESS: [name] [name] is a pleasant 81 year old white female with a history of multiple medical problems including coronary artery disease, hypertension, hyperlipidemia and previous history of lung and breast cancers. The patient notes for the last week she feels like her heart has been "out of rhythm" with pounding and rapid heartbeat. She has had associated chest pain/pressure with weakness and discomfort that radiates up into her throat. Her pain lasts about 10 to 12 minutes and is usually relieved with 2 sublingual nitroglycerin tablets. She denies any increased shortness of breath, coughing, wheezing, orthopnea or edema. She notes that she has not had any symptoms since she has been admitted. Of note, the patient has been off of her PPI therapy at home.   PAST MEDICAL HISTORY: 1. Coronary artery disease.  2. Hypertension.  3. Hyperlipidemia.  4. Hiatal hernia.  5. Lung cancer status post resection with a chronic left friction rub from sequela of her surgery for lung cancer.  6. Breast cancer status post right mastectomy.  7. Chronic kidney disease, stage III.  8. Mild aortic and mitral regurgitation.  9. Peripheral vascular disease, has known carotid artery stenosis.   ALLERGIES: No known drug allergies.   HOME MEDICATIONS: 1. Aspirin 81 mg p.o. daily.  2. Crestor 10 mg p.o. at bedtime.  3. Vitamin D3 200 international units daily.  4. Diovan 320 mg 1 tablet p.o. daily.  5. Hydralazine 100 mg p.o. 2 times a day. 6. Isosorbide mononitrate ER 120 mg once daily.  7. Metoprolol succinate ER 50 mg p.o. daily.  8. Plavix 75 mg p.o. daily.  9. Slow magnesium/calcium 64/106 mg 2  tablets daily.   FAMILY HISTORY: Mother died of colon cancer. Father died of prostate cancer. Two brothers died of lung cancer. Sister died of pancreatic cancer.   SOCIAL HISTORY: The patient lives with her son. She does not use any tobacco products, alcohol or illicit drugs. She is retired.   REVIEW OF SYSTEMS:  CONSTITUTIONAL: The patient complains of chills. Denies fevers, night sweats or weight loss.   EYES: The patient wears glasses.   HENT: The patient has some diminished hearing, has some postnasal drip.   CARDIOVASCULAR: Chest thumping and band around her chest/chest pain, chronic pain in the left lung and upper back area.   RESPIRATORY: Occasional shortness of breath. No coughing, hemoptysis.   GASTROINTESTINAL: The patient has some epigastric discomfort. She denies nausea or vomiting, diarrhea, constipation or rectal bleeding.   PHYSICAL EXAMINATION:  GENERAL: This is an elderly female who is not in any acute distress. She is alert and oriented x 3.   VITAL SIGNS: Temperature is 98.6 degrees Fahrenheit, heart rate 59, respiratory rate 18, blood pressure 135/74 and oxygen saturation 96% on room air.   HEAD/FACE: Atraumatic, normocephalic.   EYES: The patient wears glasses. Pupils are round and equal. There is no scleral icterus. Conjunctivae are pale, pink.   EARS AND NOSE: Normal to external inspection.   MOUTH: Moist mucous membranes.   NECK: Supple. The patient has a right carotid bruit. No JVD present. Thyroid is smooth and mobile.   PULMONARY: The patient has some crackles in the left lung base, which is chronic as she  has a chronic pleural friction rub from recent surgery for lung cancer.   CARDIOVASCULAR: Regular rate and rhythm. No murmurs, rubs or gallops.   ABDOMEN: Nondistended. Bowel sounds present in all four quadrants. Soft to palpation.   EXTREMITIES: No cyanosis, clubbing or edema.   ANCILLARY DATA: EKG on admission: Normal sinus rhythm at 83 beats  per minute, nonspecific ST-T changes.   LABORATORY DATA: Glucose is 82, BUN 27, creatinine 1.14, sodium 144, potassium 4.2, chloride 109 and CO2 28. Estimated GFR is 43. Total cholesterol is 129, LDL 53, triglycerides 100, HDL 56, total protein 7.9, albumin 3.6, bilirubin 0.6, alkaline phosphatase 85, AST 28 and ALT 18. Total CK is 76 and CK-MB is 1.3. Initial troponin I is 0.03, second was 0.08 and third is 0.04. TSH is 1.24. White blood cell count is 7.1, hemoglobin 10.8, hematocrit 32.4 and platelet count 146,000. D-dimer is 1.31.  RADIOLOGIC DATA: Chest x-ray from 04/30/2012: Pulmonary fibrotic changes, atelectasis versus infiltrate in the left lung base as well as possible small effusion versus scarring.   CTA to rule PE: Negative for pulmonary embolism, small left pleural effusion, chronic lung disease with reticular nodular areas of density and some scattered ground-glass areas of density, as described in the report, normal-appearing thoracic aorta without dissection, cholelithiasis, and peribronchial thickening.   ASSESSMENT AND PLAN: 1. Chest pain. The patient has chest pressure with palpitation and rapid heart rate. She has a history of coronary artery disease. She underwent stress testing on 03/30/2012 which was a normal study with normal EF, however, she had poor exercise tolerance at that time. Previous echocardiogram done on 12/31/2011 showed a normal left ventricular ejection fraction, mild diastolic dysfunction and mild mitral and aortic regurgitation. Her last cardiac catheterization was done on 08/12/2009 and reported mild to moderate coronary artery disease with stents in OM1 and mid circumflex that were patent with minor luminal irregularities, proximal LAD lesion of 25% stenosis, ostial circumflex of 50% stenosis, OM1 of 25% stenosis and proximal RCA of 25% stenosis. She does not have any acute EKG changes and only had one mildly elevated troponin. Due to her history of lung disease,  coronary artery disease, hypertension and hyperlipidemia she is at high risk for cardiovascular events. We will proceed with cardiac catheterization tomorrow with Dr. Neoma Laming. She should continue aspirin, Plavix, metoprolol, long-acting nitrates and Crestor. She has not had any more palpitations since Toprol dose was increased and heart rate is approximately 59.  2. Hypertension. The patient's blood pressure appears to be well controlled on the current regimen.  3. Hiatal hernia. The patient probably has acid reflux related to this. She has been on Protonix 2 times a day. This may be contributing to some of her chest pressure.  4. Hyperlipidemia. The patient currently is at goal on Crestor 10 mg at bedtime.  5. Chronic kidney disease, stable at this time.  6. Peripheral vascular disease. The patient has a history of carotid artery stenosis and was felt not to be a good surgical candidate and is thus being treated medically.   Thank you very much for allowing me to participate in this patient's care. ____________________________ [name] Riches, PA-C mam:sb D: 05/01/2012 09:01:40 ET T: 05/01/2012 09:18:02 ET JOB#: [id number]  cc: [name] Riches, PA-C, <Dictator> Naseem Adler A Denijah Karrer PA ELECTRONICALLY SIGNED 05/01/2012 13:08

## 2014-08-16 NOTE — H&P (Signed)
PATIENT NAME:  [name] [name], [name] [name] MR#:  [id number] DATE OF BIRTH:  February 28, 1922  DATE OF ADMISSION:  04/30/2012  PRIMARY CARE PHYSICIAN;   Pernell Dupre, MD  CARDIOLOGIST:  Neoma Laming, MD  CHIEF COMPLAINT: Chest pain.   HISTORY OF PRESENT ILLNESS: This is an 81 year old female who this week has been feeling like her heart is beating out of rhythm, a terrible pounding in the chest. She took some nitroglycerin this feeling went away this morning. She did not feel good when she got up. She had 10 to 12 minutes of feeling her heart beating with a thumping in the chest and uncomfortable feeling. She took 2 nitroglycerin and it went away.  This happened while she was walking around.  I tried to pinpoint whether she is having chest pain. She points to the epigastric area. Sometimes she does get choked on food and sometimes she has after lunch but it can happen at any time.  The patient has had a stress test in Dr. Laurelyn Sickle office which I do not have the results and this was a few weeks back and was told it was okay.  In the ER, the patient has been in the ER a while; we were waiting for a CT scan of the chest to be resulted. She had a CT scan of the chest that showed no pulmonary embolism or aortic dissection.  Since she was here while I decided to send a second troponin. I was hoping to send her home, but the second troponin came back borderline at 0.08.   PAST MEDICAL HISTORY: 1.  Coronary artery disease. 2.  Hypertension.  3.  Hyperlipidemia.  4.  Now a hiatal hernia.  5.  Lung cancer, status post resection.   6.  Breast cancer status post mastectomy and hormonal therapy.   PAST SURGICAL HISTORY:  Left lung surgery, lobectomy and right mastectomy.   ALLERGIES: No known drug allergies.   MEDICATIONS:  Aspirin 81 mg daily. Plavix 75 mg daily. Crestor 10 mg daily. Colace 100 mg daily. Hydralazine 100 mg twice a day.  Imdur 120 mg daily. Metoprolol 50 mg daily. Slow-Mag 1 tablet daily. Valsartan 320 mg  daily. Vitamin D3, 200 international units daily.   FAMILY HISTORY: Father died at 54 of prostate cancer. Mother died of colon cancer.  Two brothers died of lung cancer. A sister died of pancreatic cancer.   SOCIAL HISTORY: Lives with her son. No smoking. No alcohol. No drug use.  She is retired.    REVIEW OF SYSTEMS:  CONSTITUTIONAL: Positive for chills. No fever. No sweats. No weight loss. No weight gain. Positive for weakness.  EYES: She does wear glasses.  EARS, NOSE, MOUTH AND THROAT:  Hearing is not as good. Positive for postnasal, drip no difficulty swallowing.  CARDIOVASCULAR: Positive for chest thumping and a band around her chest pain.  Pain in the left lung area.  RESPIRATORY: Occasional shortness of breath. No coughing. No sputum. No hemoptysis.  GASTROINTESTINAL: Positive for some epigastric discomfort. No nausea. No vomiting. No abdominal pain. No diarrhea. No constipation. No bright red blood per rectum. No melena.  GENITOURINARY: No burning on urination or hematuria.  MUSCULOSKELETAL: No joint pain or muscle pain.  INTEGUMENT: No rashes or eruptions.  NEUROLOGIC: No fainting or blackouts.  PSYCHIATRIC: No anxiety or depression.  ENDOCRINE: No thyroid problems.  HEMATOLOGIC/LYMPHATIC:  No anemia, no easy bruising or bleeding.   PHYSICAL EXAMINATION: VITAL SIGNS: Temperature 98, pulse 68, respirations 18, blood pressure 154/71, pulse  ox 93%. Blood pressure when she came into the hospital included a blood pressure of 205/93.  GENERAL: No respiratory distress.  EYES: Conjunctivae and lids normal. Pupils equal, round and reactive to light. Extraocular muscles intact. No nystagmus.  EARS, NOSE, MOUTH AND THROAT:  Tympanic membranes:  No erythema. Nasal mucosa:  No erythema. Throat:  No erythema. No exudate seen. Lips and gums no lesions.  NECK: No JVD. No bruits. No lymphadenopathy. No thyromegaly. No thyroid nodules palpated.  LUNGS: Lungs are clear to auscultation. No use of  accessory muscles to breathe. No rhonchi, rales or wheeze heard.  CARDIOVASCULAR: S1, S2 normal. Positive III/VI systolic ejection murmur. Carotid upstroke 2+ bilaterally. No bruits.  EXTREMITIES: Dorsalis pedis pulses 2+ bilaterally. Trace edema of the lower extremity.  ABDOMEN: Soft, nontender. No organomegaly/splenomegaly. Normoactive bowel sounds. No masses felt.  LYMPHATIC: No lymph nodes in the neck.  MUSCULOSKELETAL: No clubbing, edema or cyanosis.  SKIN: No rashes or lesions.  NEUROLOGIC: Cranial nerves II through XII grossly intact. Deep tendon reflexes 2+ bilateral lower extremities.  PSYCHIATRIC: The patient is oriented to person, place and time.   LABORATORY AND RADIOLOGICAL DATA: Chest x-ray showed findings likely representing pulmonary fibrotic changes, atelectasis versus infiltrate left lung base. D-dimer elevated at 1.31. First troponin negative. White blood cell count 8.5, hemoglobin and hematocrit 12.9 and 37.9, platelet count 170.  Glucose 95, BUN 28, creatinine 1.16, sodium 141, potassium 4.0, chloride 105, CO2 28, calcium 9.9. Liver function tests normal range. GFR 42. Urinalysis 30 mg/dL of protein.  The second troponin ordered by me came back at 0.08, which is borderline. EKG showed normal sinus rhythm, 83 beats per minute, left ventricular hypertrophy. CT scan of the chest showed no evidence of pulmonary embolism, small left pleural effusion, atelectasis changes, moderate hiatal hernia.  No aortic dissection.  Thyroid nodules indeterminate in the left upper lobe of the lung.   ASSESSMENT AND PLAN: 1.  Chest pain described as a thumping with palpitations with history of coronary artery disease. We waited for a CT scan of the chest to be resulted which was not resulted until 6:35 p.m.  That was negative. I went and saw her and I was hoping to send her home. I ordered a second troponin and if that was completely negative, I would have sent her home, but it came back with borderline,  so I will admit her as an observation. I will get a cardiology consult with Dr. Humphrey Rolls in the a.m.  The patient did have a stress test in his office a few weeks back. I do not have the results of this.  I will let Dr. Humphrey Rolls determine on what the step needs to be taken, whether cardiac catheter medical management. The patient is already on good medical management with aspirin, Plavix, metoprolol, Imdur and Crestor. I will bump up the patient's Toprol to 50 mg twice a day since the patient was complaining of palpitations. Will monitor on telemetry for arrhythmia and check another few sets of cardiac enzymes.  2.  Malignant hypertension. I will try to control with oral medications. Blood pressure did come down with an extra dose of Toprol.  3.  Hiatal hernia seen on CT scan. We will put on Protonix and p.r.n. Maalox. Could this be contributing to her discomfort.  4.  Hyperlipidemia. Continue Crestor.  Check a lipid profile in the a.m.  5. History of breast and lung cancer with nodules in the chest.  That has to be followed up  as outpatient.  6.  Thyroid nodule. Will check a TSH and free T4 in the a.m.   Time spent on admission:  55 minutes.    ____________________________ Tana Conch. Leslye Peer, MD rjw:ct D: 04/30/2012 20:33:57 ET T: 05/01/2012 07:53:03 ET JOB#: [id number]  cc: Tana Conch. Leslye Peer, MD, <Dictator> Sheikh A. Elijio Miles, MD Dionisio David, MD  Marisue Brooklyn MD ELECTRONICALLY SIGNED 05/11/2012 19:56

## 2014-08-16 NOTE — Discharge Summary (Signed)
PATIENT NAME:  [name] [name], TRABERT MR#:  [id number] DATE OF BIRTH:  08/03/1921  DATE OF ADMISSION:  04/30/2012 DATE OF DISCHARGE:  05/02/2012  DISCHARGE DIAGNOSIS:  1. Chest pain. 2. Gastroesophageal reflux disease.  SECONDARY DIAGNOSES: 1. Coronary artery disease.  2. Hypertension. 3. History of lung cancer.   PROCEDURE: Left heart catheterization.  CONSULTANT: Neoma Laming, MD - Cardiology.  DISCHARGE MEDICATIONS: Please refer to discharge medical reconciliation for discharge medicines.   DIET: Low fat, low cholesterol.   ACTIVITY: No exertional activity.   DISCHARGE FOLLOWUP: With cardiologist and with Dr. Neoma Laming in 1 to 2 days.  HOSPITAL COURSE: This 81 year old lady was admitted through the Emergency Room complaining of substernal chest pain. Please refer to the history and physical for further details. She was treated with topical nitro paste. Her home medications were resumed as well as her aspirin. The patient'Harrietta Incorvaia chest pain resolved with application of nitro paste. Her serial cardiac enzymes were notable for troponin of 0.08 at peak with normal CK and CK-MB. Left heart catheterization was performed by Dr. Neoma Laming which did not show any new obstructive or coronary artery disease. The patient remained chest pain-free throughout the remainder of her stay and was discharged to home following her catheterization. Cardiology attributed her pain to possible gastroesophageal reflux disease. The patient is to continue her proton pump inhibitors. She will follow up with cardiology for further evaluation and was instructed to call her cardiologist if she experienced recurrence of her chest pain.   DISPOSITION: Home.   DISCHARGE PROCESS TIME SPENT: 37 minutes. ____________________________ Venetia Maxon Elijio Miles, MD sat:sb D: 05/19/2012 12:27:00 ET T: 05/19/2012 12:59:53 ET JOB#: [id number]  cc: Sheikh A. Elijio Miles, MD, <Dictator> Veverly Fells MD ELECTRONICALLY SIGNED  05/22/2012 15:03

## 2014-08-16 NOTE — Consult Note (Signed)
Brief Consult Note: Diagnosis: CP.   Comments: Patient with known CAD has experienced palpitations/rapid heartbeat with CP radiating to neck and weakness. She has known CAD and previous stenting and due to sx will need to r/o obstructive CAD. Will schedule cardiac cath for tomorrow.  Electronic Signatures: Angelica Ran (MD)   (Signed 07-Jan-14 09:37)  Co-Signer: Brief Consult Note Merla Riches (PA-C)   (Signed 06-Jan-14 08:49)  Authored: Brief Consult Note  Last Updated: 07-Jan-14 09:37 by Angelica Ran (MD)

## 2014-08-18 NOTE — Consult Note (Signed)
PATIENT NAME:  [name] [name], [name] [name] MR#:  [id number] DATE OF BIRTH:  1921/11/25  DATE OF CONSULTATION:  08/12/2011  REFERRING PHYSICIAN:  Dr. Bridgette Habermann  CONSULTING PHYSICIAN:  Otelia Limes. Yves Dill, MD  REASON FOR CONSULTATION: Kidney stones.   HISTORY OF PRESENT ILLNESS: Ms. Lavallie is an 81 year old Caucasian female with left ureterolithiasis who is status post left ureteroscopic ureterolithotomy with holmium laser lithotripsy and stent placement on March 4th. She pulled her stent out two days postop and developed pain requiring replacement of the stent on March 17th. She went to the ER today because she has been feeling poorly, weak, and having some orthostasis. She has not been eating or drinking well recently. She has a few residual stone fragments present in the ureter but we felt that we could safely remove her stent next week when I saw her in the office. She does have a history of breast cancer which was removed eight years ago and lung cancer which was removed about 9 to 10 months ago. I am not sure about what the follow-up status with both of those cancers is at this time. She has not had a bowel movement in four days now. She has a history of chronic constipation.   PAST MEDICAL HISTORY, SOCIAL HISTORY, REVIEW OF SYSTEMS: Reviewed per Dr. Evie Lacks dictation and my previous history and physical and there have been no significant changes.   IMPRESSION: Left ureterolithiasis status post ureteroscopic ureterolithotomy and stent placement.   SUGGESTIONS:  1. I will postpone stent retrieval until she is medically stable and she was instructed to call the office after discharge to reschedule.  2. Suggest looking for other causes for her poor p.o. intake and nausea particularly since she has a history of two other cancers. 3. Also will consider enemas and laxatives to encourage bowel movement.   ____________________________ Otelia Limes. Yves Dill, MD mrw:drc D: 08/12/2011 18:06:40 ET T: 08/13/2011  07:19:24 ET JOB#: [id number] MICHAEL R WOLFF MD ELECTRONICALLY SIGNED 08/13/2011 8:25

## 2014-08-18 NOTE — Consult Note (Signed)
PATIENT NAME:  [name] [name], DISS MR#:  [id number] DATE OF BIRTH:  02/25/1922  DATE OF CONSULTATION:  06/28/2011  REFERRING PHYSICIAN:  Dr. Elijio Miles CONSULTING PHYSICIAN:  Jill Side, MD  PRIMARY CARE PHYSICIAN: Dr. Hortencia Pilar  REASON FOR CONSULTATION: Anemia.   HISTORY OF PRESENT ILLNESS: 81 year old female with history of kidney stones in the past, chronic renal failure. Patient was admitted with hematuria and worsening renal failure. She was found to have a left ureteric stones and underwent treatment today by Dr. Yves Dill. Patient was also found to be severely anemic and has received blood transfusion. Her hemoglobin is now stable. Patient denies any significant GI symptoms except for mild chronic constipation for which she takes stool softeners at home. She has never had a colonoscopy as far as she can remember and she has never had an upper GI endoscopy as well.   PAST MEDICAL HISTORY:  1. History of coronary artery disease status post CABG. 2. Hyperlipidemia.  3. Hypertension.  4. Chronic kidney disease.  5. History of supraventricular tachycardia last year for which she is on beta blocker. 6. History of right breast carcinoma.   PAST SURGICAL HISTORY:  1. Appendectomy.  2. Left lower lobe lobectomy.   ALLERGIES: None.   HOME MEDICATIONS:  1. Hydralazine.  2. Exforge. 3. Metoprolol. 4. Plavix. 5. Isosorbide. 6. Lasix. 7. Crestor. 8. Aspirin. 9. Gabapentin. 10. Vitamin D3.   SOCIAL HISTORY: She does not smoke or drink.   FAMILY HISTORY: Unremarkable.   REVIEW OF SYSTEMS: Grossly negative except for what is mentioned in the History of Present Illness.   PHYSICAL EXAMINATION:  GENERAL: Elderly female. She does not appear to be in any acute distress. Overall appears somewhat weak after surgery, somewhat pale. No jaundice was noticed.   LUNGS: Grossly clear to auscultation bilaterally with fair air entry and no added sounds.   CARDIOVASCULAR: Regular rate and  rhythm. No gallops or murmur.   ABDOMEN: Quite soft and benign. Bowel sounds are positive and regular. No hepatosplenomegaly or ascites were noted.   EXTREMITIES: Mild pedal edema.   NEUROLOGIC: Examination appears to be grossly unremarkable.   LABORATORY, DIAGNOSTIC AND RADIOLOGICAL DATA: Her hemoglobin was normal on 02/03, about a month ago. Her hemoglobin on admission this time was low at 9.7 and dropped down to 7.7. She has been transfused and her hemoglobin is now 10.2. BUN 26, creatinine 1.71, electrolytes are otherwise unremarkable. CT of the abdomen and pelvis shows moderate hydroureter and hydronephrosis on the left side. Colonic diverticulosis was noted.   ASSESSMENT AND PLAN: Patient with normal hemoglobin about a month ago is now presenting with severe anemia. She does have some hematuria but I do not believe that anemia can be explained by hematuria alone, probably anemia is multifactorial and is secondary to hematuria as well as poor kidney function and probable some GI blood loss as well. There are no signs of active GI bleeding but patient is elderly and her history is not very reliable. Due to the fact that patient has never had an EGD or a colonoscopy and the fact that she has history of breast cancer, I would be quite concerned about GI lesions such as polyps and even cancer. I have recommended an EGD as well as a colonoscopy for further evaluation. Patient is in full agreement although she would like to go home tomorrow and get those procedures done as outpatient. If patient goes home tomorrow she can follow with me next week and we will set up  an EGD and colonoscopy as outpatient. If patient for some reason ends up staying here until day after tomorrow, which is my procedure day, then we can proceed with an EGD and a colonoscopy while she is inpatient prior to discharge. Will discuss with Dr. Elijio Miles. Follow hemoglobin and hematocrit. Further recommendations to follow.    ____________________________ Jill Side, MD si:cms D: 06/28/2011 18:43:04 ET T: 06/29/2011 09:41:22 ET JOB#: [id number]  cc: Jill Side, MD, <Dictator> Jill Side MD ELECTRONICALLY SIGNED 06/30/2011 8:59

## 2014-08-18 NOTE — Op Note (Signed)
PATIENT NAME:  [name] [name], [name] [name] MR#:  [id number] DATE OF BIRTH:  [birth year]  DATE OF PROCEDURE:  06/28/2011  PREOPERATIVE DIAGNOSES:  1. Left ureterolithiasis.  2. Left hydronephrosis.  3. Renal insufficiency.  POSTOPERATIVE DIAGNOSES:  1. Left ureterolithiasis.  2. Left hydronephrosis.  3. Renal insufficiency.  PROCEDURE: 1. Left ureteroscopic ureterolithotomy with holmium laser lithotripsy.  2. Left double pigtail stent placement.  3. Fluoroscopy.   SURGEON: Maryan Puls, M.D.   ANESTHETIST:  Dr. Phineas Douglas   ANESTHESIA: General.   INDICATIONS: See the dictated consultation. After informed consent, the patient requests the above procedures.   OPERATIVE SUMMARY: After adequate general anesthesia had been obtained, the patient was placed into the dorsal lithotomy position and the perineum was prepped and draped in the usual fashion. The mini rigid ureteroscope was coupled to the camera and then visually advanced into the bladder and then up the left ureter. The scope could only be advanced to mid ureter due to angulation. Therefore, it was removed and the cystoscope placed into the bladder. A 0.035 Glidewire was then advanced through the scope and up the left ureter under fluoroscopic guidance. The guidewire could not be passed beyond the stone in the upper ureter. Therefore, the guidewire was left in place and the cystoscope removed. A ureteral access sheath was then placed and the flexible ureteroscope was then passed over the guidewire to the level of the stone. The stone was impacted in the ureter. With gentle compression the stone was pushed into the renal pelvis and the scope advanced to the renal pelvis as well. In this location the 300-micron holmium laser fiber was advanced through the scope and the stone was partially fragmented. Due to the large amount of debris and purulent material in the kidney, I could not confirm absolute complete disintegration of the stone. At this point the  scope was removed, taking care to leave the guidewire in position. The cystoscope was back-loaded over the guidewire and a 6 x 24-cm double pigtail stent was passed over the guidewire and positioned in the ureter under fluoroscopic guidance. The guidewire was then removed, taking care to leave the stent in position. At this point the bladder was drained and the scope was removed. The procedure was then terminated and the patient was transferred to the recovery room in stable condition.     ____________________________ Otelia Limes. Yves Dill, MD mrw:bjt D: 06/28/2011 14:21:03 ET T: 06/28/2011 14:44:29 ET JOB#: [id number]  cc: Otelia Limes. Yves Dill, MD, <Dictator> Royston Cowper MD ELECTRONICALLY SIGNED 06/29/2011 7:36

## 2014-08-18 NOTE — Consult Note (Signed)
PATIENT NAME:  [name] [name], [name] [name] MR#:  [id number] DATE OF BIRTH:  1922-04-25  DATE OF CONSULTATION:  07/11/2011  REFERRING PHYSICIAN:  Pernell Dupre, MD CONSULTING PHYSICIAN:  Darcella Cheshire, MD  REASON FOR CONSULTATION: History of left ureteral stone with left abdominal pain and urinary tract infection.  HISTORY OF PRESENT ILLNESS: The patient is an 81 year old Caucasian female without prior history of urolithiasis who presented to her nephrologist approximately 2 to 3 weeks ago after developing gross painless hematuria for 3 to 4 days. The patient reports seeing red blood in the toilet bowl without clots. The patient denies any dysuria or flank or abdominal pain. The patient denies any fever or chills. The patient does report a two year history of chronic nausea with mild constipation. The patient reported her hematuria to her nephrologist at [facility] during a scheduled routine appointment. A stone protocol CT scan was ordered and performed on 06/23/2011. The CT scan revealed a 9 mm stone in the mid left ureter just above the level of the iliac crest with moderate hydroureter and hydronephrosis. The patient was admitted to [facility] on 06/24/2011 with acute on chronic renal insufficiency. Her creatinine was 2.59 which was increased from her baseline of 1.5. Outpatient and urine cultures were also growing Escherichia coli, which was pansensitive. Dr. Maryan Puls of urology was consulted on 06/25/2011. Dr. Maryan Puls took the patient to surgery on 06/28/2011 where an impacted left proximal ureteral stone was encountered. The stone was manipulated back into the left renal pelvis and laser fragmentation of the stone was performed. Due to the large amount of debris and purulence within the collecting system, it could not be confirmed that the stone was completely fragmented. As such a 50 [place] ureteral stent was placed with plans for possible lithotripsy should there be any  significant residual stone burden. The patient was discharged from the hospital on postoperative day number one, 06/29/2011. The next day, on postoperative day number two, the patient reports that the ureteral stent became dislodged and was removed. The patient was scheduled for lithotripsy on 07/08/2011, however, per the patient and her son, lithotripsy was not performed due to an inability to identify any stones in the left kidney or ureter. The patient now presents to the [facility] Emergency Department with four days of increased nausea with an inability to tolerate solid food, however, fluids have been able to be maintained. After presentation to the Emergency Department this morning, the patient developed left lower quadrant abdominal pain, aching in nature, rating a 5 to 6/10 and lasting for 30 minutes. The patient also reports some increase in urinary urgency, but no dysuria. Of note, on 07/08/2011, the patient had a KUB done after cancellation of the lithotripsy which revealed what appeared to be a 7 mm stone in the distal left ureter near the ureterovesical junction. The patient is now being admitted to the medicine service with generalized weakness and nausea with poor p.o. intake as well as mild dehydration and acute on chronic renal insufficiency. Urologic consultation is being requested given the patient's complicated recent stone history.   PAST MEDICAL HISTORY:  1. Hypertension.  2. Coronary artery disease status post stenting twice 8 to 10 years ago at [facility].  3. History of breast cancer status post right mastectomy greater than 10 years ago at [facility] with no evidence for recurrent disease.  4. Lung cancer status post left lower lobe lobectomy in June of 2012  at [facility]. No postoperative or neoadjuvant chemotherapy or radiation has been performed.  5. Hyperlipidemia.   PAST SURGICAL HISTORY:  1. Status post right  mastectomy (greater than 10 years ago at [facility]).  2. Left lower lobectomy June 2012 at [facility].  3. Status post appendectomy.   DRUG ALLERGIES: No known drug allergies.   FAMILY HISTORY: Significant for urolithiasis in the patient's son.   SOCIAL HISTORY: Tobacco - the patient denies any tobacco use. Alcohol - the patient denies any alcohol use.   MEDICATIONS: As per the discharge summary from 06/29/2011 (The patient does not remember her medications).  1. Crestor 10 mg p.o. daily.  2. Lasix 20 mg p.o. daily.  3. Exforge 10/320 mg p.o. daily.  4. Gabapentin 300 mg p.o. daily.  5. Metoprolol ER one p.o. daily.  6. Vitamin D3 2000 international units a day.  7. Imdur 120 mg p.o. daily, extended release.  8. Zofran 4 mg p.o. every 6 hours p.r.n. nausea.  9. Colace 100 mg p.o. twice a day. 10. Ciprofloxacin 500 mg p.o. twice a day. 11. Tapentadol 50 mg one p.o. every 4 to 6 hours p.r.n.  12. The patient remains off her aspirin and Plavix.   PHYSICAL EXAMINATION:   VITALS: Temperature 98 degrees Fahrenheit, pulse 84 and regular, respirations 18, blood pressure 130/59, and pulse oximetry 88% on room air, which increased to 96% on 2 liters of nasal cannula.   GENERAL: Well-developed, well-nourished female in no apparent distress, pleasant and cooperative.   HEENT: Normocephalic, atraumatic. Extraocular movements intact. Anicteric.   NECK: No masses or bruits.   LUNGS: Clear to auscultation with normal respiratory effort.  HEART: Regular rate and rhythm without murmurs, gallops, or rubs.  EXTREMITIES: No edema.   MUSCULOSKELETAL: Normal range of motion.  ABDOMEN: Soft, nontender, and nondistended. No costovertebral angle tenderness. Positive hypoactive bowel sounds.   NEURO: Nonfocal.  PSYCH: Alert and oriented x4.   SKIN: Warm and dry without lesions or rashes about the head, face, neck, and upper extremities.   LYMPH: No  cervical or supraclavicular adenopathy.   LABS/STUDIES: From 07/11/2011 at 8:36 a.m.: BUN 29, creatinine 1.91, sodium 141, potassium 3.3, chloride 99, CO2 32. White blood cell count 13.4, hematocrit 31.3, platelet count 145.   Urinalysis obtained at 11:20 a.m.: Specific gravity 1.016, pH 7.03, 1+ leukocyte esterase, negative nitrites.   Microscopic evaluation revealed 110 white blood cells per high-power field and 2 red blood cells per high-power field.  ASSESSMENT:  1. Urinary tract infection. The patient does not have any clinical signs of pyelonephritis.  2. Acute on chronic renal insufficiency  3. Worsening nausea.  4. History of left ureterolithiasis - given the complicated recent history of possible partial treatment of her ureteral stone with the mild acute renal insufficiency, progressive nausea, and urinary tract infection, I would recommend re-CT imaging to evaluate for ureteral obstruction and/or any residual stone burden.  RECOMMENDATIONS: 1. NPO pending the CT scan.  2. Stat CT scan of the abdomen and pelvis.  3. Agree with continuing ciprofloxacin.  4. Consider adding Zosyn IV.   Thank you for the opportunity to participate in the care of this most pleasant woman. ____________________________ Darcella Cheshire, MD jhk:slb D: 07/11/2011 14:51:34 ET T: 07/11/2011 15:39:38 ET JOB#: [id number]  cc: Darcella Cheshire, MD, <Dictator> Darcella Cheshire MD ELECTRONICALLY SIGNED 08/14/2011 15:55

## 2014-08-18 NOTE — Consult Note (Signed)
Chief Complaint:   Subjective/Chief Complaint Left Distal Ureteral Stones   VITAL SIGNS/ANCILLARY NOTES: **Vital Signs.:   17-Mar-13 12:16   Vital Signs Type Admission   Temperature Temperature (F) 98   Celsius 36.6   Temperature Source oral   Pulse Pulse 84   Pulse source per Dinamap   Respirations Respirations 18   Systolic BP Systolic BP 035   Diastolic BP (mmHg) Diastolic BP (mmHg) 59   Mean BP 82   BP Source Dinamap   Pulse Ox % Pulse Ox % 88   Pulse Ox Activity Level  At rest   Oxygen Delivery Room Air/ 21 %    12:18   Pulse Ox % Pulse Ox % 96   Oxygen Delivery 2L   Brief Assessment:   Respiratory normal resp effort     Additional Physical Exam WDWN WF in NAD     Routine Hem:  17-Mar-13 08:36    WBC (CBC) 13.4   RBC (CBC) 3.48   Hemoglobin (CBC) 10.4   Hematocrit (CBC) 31.3   Platelet Count (CBC) 145   MCV 90   MCH 30.0   MCHC 33.3   RDW 13.0  Routine Chem:  17-Mar-13 08:36    Glucose, Serum 123   BUN 29   Creatinine (comp) 1.91   Sodium, Serum 141   Potassium, Serum 3.3   Chloride, Serum 99   CO2, Serum 32   Calcium (Total), Serum 8.7  Hepatic:  17-Mar-13 08:36    Bilirubin, Total 0.9   Alkaline Phosphatase 50   SGPT (ALT) 17   SGOT (AST) 25   Total Protein, Serum 7.0   Albumin, Serum 2.4  Routine Chem:  17-Mar-13 08:36    Osmolality (calc) 288   eGFR (African American) 32   eGFR (Non-African American) 26   Anion Gap 10  Cardiac:  17-Mar-13 08:36    Troponin I 0.10  Routine Chem:  17-Mar-13 08:36    Lipase 135  Cardiac:  17-Mar-13 08:36    CK, Total 19   CPK-MB, Serum < 0.5   Radiology Results:  CT:    17-Mar-13 14:00, CT Abdomen and Pelvis Without Contrast   CT Abdomen and Pelvis Without Contrast    REASON FOR EXAM:    (1) Left Abd Pain with nausea and h/o stones; (2)   Left Abd Pain with nausea, h/o  COMMENTS:   LMP: Post-Menopausal    PROCEDURE: CT  - CT ABDOMEN AND PELVIS W0  - Jul 11 2011  2:00PM     RESULT:  Indication: Left-sided abdominal pain    Comparison: None    Technique: Multiple axial images from the lung bases to the symphysis   pubis were obtained without oral and without intravenous contrast.    Findings:    There is a small left pleural effusion with areas of loculation. There is   bibasilar fibrosis. There is no pleural or pericardial effusions.    There is a 9 mm distal left ureteral calculus just proximal to the UVJ.   There is a second 7 mm the distal left ureteral calculus just proximal to   the UVJ. There is a 3 mmcalculus at the left UVJ. There is moderate left   hydronephrosis. There are 2 hypodense fluid attenuating left renal masses   most consistent with cysts. The bladder is unremarkable.    The liver demonstrates no focal abnormality. The gallbladder is  unremarkable. The spleen demonstrates no focal abnormality. The adrenal   glands and pancreas  are normal.     There is a moderate-sized hiatal hernia. The unopacified duodenum, small   intestine, and large intestine are unremarkable, but evaluationis     limited by lack of oral contrast. There is diverticulosis without   evidence of diverticulitis. a There is no pneumoperitoneum, pneumatosis,   or portal venous gas. There is no abdominal or pelvic free fluid. There   is no lymphadenopathy.     The abdominal aorta is normal in caliber with atherosclerosis.    The osseous structures are unremarkable.    IMPRESSION:     1. There is a 9 mm distal left ureteral calculus just proximal to the   UVJ. There is a second 7 mm the distal left ureteralcalculus just   proximal to the UVJ. There is a 3 mm calculus at the left UVJ. There is   moderate left hydronephrosis.   2. Small left pleural effusion and with areas of loculation.          Verified By: Jennette Banker, M.D., MD   Assessment/Plan:  Assessment/Plan:   Assessment 1. Multiple Left Distal Ureteral Stone Fragments (from 3 to 57m) with mod hydro on  CT -d/w pt/son/grand-daughter, the indications for, and relative risks/benefits of ureteral stenting, along with alternatives (Left PCN placement). They understand and wish to proceed. They understand the possibility that retrograde access may not be obtainable thereby necessitating PCN placement. They understand the risk of bacteremia/sepsis and wish to proceed.  2. UTI - on Cipro. Zosyn added  3. Acute on Chronic Renal Insufficiency - likely due to dehydration and acute unilateral obstruction  4. Acute worsening of nausea - likely due to left ureteral obstruction    Plan 1. Urgent Left Ureteral Stent Placement to decompress the obstructed (and possibly infected) left collecting system.  2. Add Zosyn to Cipro (already ordered).  Discussed with Dr. TElijio Miles   Electronic Signatures: KDarcella Cheshire(MD)  (Signed 17-Mar-13 15:48)  Authored: Chief Complaint, VITAL SIGNS/ANCILLARY NOTES, Brief Assessment, Lab Results, Radiology Results, Assessment/Plan   Last Updated: 17-Mar-13 15:48 by KDarcella Cheshire(MD)

## 2014-08-18 NOTE — H&P (Signed)
PATIENT NAME:  [name] [name], [name] [name] MR#:  [id number] DATE OF BIRTH:  April 04, 1922  DATE OF ADMISSION:  08/30/2011  CHIEF COMPLAINT: Urinary stent.   HISTORY OF PRESENT ILLNESS: Ms. Rettinger is an 81 year old white female who is status post left ureteroscopic ureterolithotomy with stent placement and comes in now for stent retrieval.   ALLERGIES: No drug allergies.   CURRENT MEDICATIONS: Colace, Crestor, Exforge, Imdur, Lasix, metoprolol, omeprazole, and vitamin D3.   PAST SURGICAL HISTORY: Appendectomy, left lung lobectomy, and coronary bypass surgery.   PAST AND CURRENT MEDICAL CONDITIONS:  1. Hypertension.  2. Mild renal insufficiency.  3. Hyperlipidemia.  4. Supraventricular tachycardia. 5. Coronary artery disease.  6. Breast cancer.   REVIEW OF SYSTEMS: The patient denied chest pain or shortness of breath.   PHYSICAL EXAMINATION:  GENERAL: Well-nourished white female in no distress.   HEENT: Sclerae were clear. Pupils were equal, round, and reactive to light and accommodation. Extraocular movements are intact.   NECK: Supple. No palpable cervical adenopathy.   LUNGS: Clear to auscultation.   HEART: Regular rhythm and rate without audible murmurs.   ABDOMEN: Soft, nontender abdomen.   GU/RECTAL: Deferred.   NEUROMUSCULAR: Nonfocal. The patient was alert and oriented x3.   IMPRESSION: Left ureterolithiasis status post UU and stent placement.   PLAN: Cystoscopy with stent retrieval.  ____________________________ Otelia Limes. Yves Dill, MD mrw:rbg D: 08/26/2011 12:04:29 ET T: 08/26/2011 12:31:23 ET JOB#: [id number]  cc: Otelia Limes. Yves Dill, MD, <Dictator> Royston Cowper MD ELECTRONICALLY SIGNED 08/26/2011 16:45

## 2014-08-18 NOTE — Op Note (Signed)
PATIENT NAME:  [name] [name], [name] [name] MR#:  [id number] DATE OF BIRTH:  04/08/1922  DATE OF PROCEDURE:  08/30/2011  PREOPERATIVE DIAGNOSIS: Left ureteral stent.   POSTOPERATIVE DIAGNOSIS: Left ureteral stent.   PROCEDURE: Cystoscopy with stent retrieval.   SURGEON: Otelia Limes. Yves Dill, MD  ANESTHETIST: Andree Elk and Yves Dill  ANESTHESIA: General per Dr. Andree Elk and local per Dr. Yves Dill.   INDICATIONS: See the dictated history and physical. After informed consent, the patient requested the above procedure.   OPERATIVE SUMMARY: After adequate general anesthesia had been obtained, patient was placed into dorsal lithotomy position and the perineum was prepped and draped in the usual fashion. The 21 French cystoscope was coupled with the camera and then placed into the bladder. Bladder was thoroughly inspected. No bladder mucosal lesions were identified. Right orifice had clear efflux. The left orifice had stent in position. Using the alligator forceps the stent was engaged and removed. 10 mL of viscous Xylocaine was instilled within the urethra and the bladder. Procedure was then terminated and the patient was transferred to the recovery room in stable condition.   ____________________________ Otelia Limes. Yves Dill, MD mrw:cms D: 08/30/2011 09:11:54 ET T: 08/30/2011 10:22:02 ET JOB#: [id number]  cc: Otelia Limes. Yves Dill, MD, <Dictator> Royston Cowper MD ELECTRONICALLY SIGNED 08/30/2011 12:34

## 2014-08-18 NOTE — Consult Note (Signed)
PATIENT NAME:  [name] [name], [name] [name] MR#:  [id number] DATE OF BIRTH:  [birth year]  DATE OF CONSULTATION:  06/25/2011  REFERRING PHYSICIAN:  Theodoro Grist, MD CONSULTING PHYSICIAN:  Otelia Limes. Yves Dill, MD  REASON FOR CONSULTATION: Kidney stone.   HISTORY OF PRESENT ILLNESS: [name] [name] is an 81 year old Caucasian female who presented to her primary care physician with worsening renal function and left upper quadrant abdominal pain. She was found to have a 9 mm left midureteral stone with hydronephrosis on CT scan. At the present time, she is not having any pain and denies fever, chills, or gross hematuria. She has had no prior similar episodes. Creatinine was 2.8 today.   ALLERGIES: No known drug allergies.   CHRONIC HOME MEDICATIONS: Hydralazine, Exforge, metoprolol, clopidogrel, isosorbide, Lasix, Crestor, aspirin, gabapentin, vitamin D3.   PAST SURGICAL HISTORY:  1. Appendectomy.  2. Left lower lung lobectomy.   SOCIAL HISTORY: She denied tobacco or alcohol use.   FAMILY HISTORY: Remarkable for hypertension.   PAST AND CURRENT MEDICAL CONDITIONS: 1. Hypertension. 2. Mild renal insufficiency.  3. History of supraventricular tachycardia.  4. Hyperlipidemia. 5. Coronary artery disease status post coronary bypass graft.  6. History of breast cancer.   PHYSICAL EXAMINATION: ABDOMEN: Abdomen was soft. She had no flank tenderness.   RESULTS: Blood cultures were no growth, 06/24/2011.   CT scan films and report were reviewed.   IMPRESSION:  1. Left ureterolithiasis with hydronephrosis.  2. Mild renal insufficiency with creatinine 2.8.   PLAN:  1. Increase fluid intake and strain urine.  2. Suggest discontinuing aspirin and clopidogrel so that we can proceed with urologic intervention Monday. 3. Nucynta 50 mg every 4 to 6 hours as needed for pain.  4. Zofran 4 mg sublingual as needed for nausea and vomiting.  5. Continue oral Levaquin. 6. I have scheduled her for left retrograde with  stent placement with possible ureteroscopic ureterolithotomy on Monday. ____________________________ Otelia Limes. Yves Dill, MD mrw:slb D: 06/25/2011 12:35:00 ET T: 06/25/2011 12:57:49 ET JOB#: [id number]  cc: Otelia Limes. Yves Dill, MD, <Dictator> Royston Cowper MD ELECTRONICALLY SIGNED 06/29/2011 7:36

## 2014-08-18 NOTE — H&P (Signed)
PATIENT NAME:  [name] [name], [name] [name] MR#:  [id number] DATE OF BIRTH:  [birth year]  DATE OF ADMISSION:  08/12/2011  REFERRING PHYSICIAN: Dr. Thomasene Lot  PRIMARY CARE PHYSICIAN: Wilnette Kales, PA with Dr. Elijio Miles at [facility]  UROLOGIST: Dr. Yves Dill   CHIEF COMPLAINT: Nausea, weakness.   HISTORY OF PRESENT ILLNESS: Patient is an 81 year old Caucasian female with history of recent hospitalizations for urinary tract infection and left ureterolithiasis status post lithotripsy, which patient states was unsuccessful, status post stent placement who had presented recently for hydronephrosis, acute on chronic renal failure and pyelonephritis, presents with 8 to 10 days of weakness and nausea and decreased p.o. intake. Patient has been following up with Dr. Yves Dill as an outpatient apparently. Patient has had lithotripsy per her within the last week and also states had some x-rays done. Of note, there is a KUB here 04/11 which shows double stent ureteral stent is present and there is two calcifications in the left pelvic area consistent with distal left ureteral stones and probable right nephrolithiasis. She presents with dirty urine, some dysuria as well as acute on chronic renal failure and hospitalist services were contacted for further evaluation and management.   PAST MEDICAL HISTORY:  1. Left-sided ureterolithiasis status post lithotripsy and stent placement by Dr. Yves Dill on 06/28/2011.   2. Complicated urinary tract infection with pyelo; just finished an antibiotic which she does not know what. 3. History of hematuria in the past.  4. History of chronic anemia. 5. History of coronary artery disease status post coronary artery bypass graft.  6. History of lung  cancer status post surgery last year per chart 7. Hypertension. 8. Hyperlipidemia. 9. Chronic kidney disease, baseline creatinine of about 1.6.  10. History of bright breast cancer status post mastectomy. 11. Appendectomy. 12. Left lower lobe lobectomy.    SOCIAL HISTORY: Lives with her son. No tobacco, alcohol or drug use.   ALLERGIES: None.   FAMILY HISTORY: Positive for hypertension.   CURRENT MEDICATIONS:  1. Colace 100 mg daily.  2. Crestor 10 mg daily.  3. Exforge 10/320, 1 tab daily.  4. Imdur 60 mg daily.  5. Lasix 20 mg 2 times a day.  6. Metoprolol succinate extended-release 100 mg 1/2 tab daily.  7. Omeprazole 20 mg daily.  8. Vitamin D3, 2000 international units daily.    REVIEW OF SYSTEMS: CONSTITUTIONAL: Denies fever but there is fatigue and generalized weakness. No weight changes. EYES: No blurry vision or double vision. ENT: No tinnitus or hearing loss or snoring. RESPIRATORY: Denies cough, wheezing, chest pain or chronic obstructive pulmonary disease. CARDIOVASCULAR: Denies chest pains or orthopnea. Has some dizziness on standing. No arrhythmia or dyspnea on exertion. GASTROINTESTINAL: Nausea without vomiting. Some mild abdominal discomfort. No dark stools. GENITOURINARY: Has some dysuria and discomfort on urination. History of renal stone and stenting. ENDOCRINE: Denies polyuria or nocturia. No thyroid problems. HEME/LYMPH: Has history of anemia and history of bleeding in the past. SKIN: Denies any rashes. MUSCULOSKELETAL: Has arthritis. NEUROLOGIC: Denies weakness focally or numbness. PSYCH: No anxiety or depression.   PHYSICAL EXAMINATION:  VITAL SIGNS: Vitals on arrival: Temperature 96.7, pulse 80, respiratory rate 18, blood pressure 125/95, oxygen saturation 100% on room air.   GENERAL: Patient is a well-developed Caucasian female looks good for her age overall, no acute distress, sitting in bed talking in full sentences.   HEENT: Normocephalic, atraumatic. Pupils are equal and reactive. Anicteric sclerae. Dry mucous membranes. Extraocular muscles intact.   NECK: Supple. No thyroid tenderness. No JVD.  CARDIOVASCULAR: S1, S2 regular rate and rhythm. No murmurs, rubs, or gallops.   LUNGS: Clear to auscultation  bilaterally. No wheezing or rhonchi.   ABDOMEN: Soft. Some suprapubic tenderness to deep palpation. Normoactive bowel sounds in all quadrants. No costophrenic angle tenderness on palpation.   EXTREMITIES: No significant edema.   NEUROLOGICAL: Cranial nerves II through XII grossly intact. Strength 5/5 in all extremities. Sensation intact.   PSYCH: Awake, alert, oriented x3, pleasant, cooperative.   LABORATORY, DIAGNOSTIC AND RADIOLOGICAL DATA: Glucose 105. BUN 38, it was 13 on 03/24 and creatinine on 03/24 was 1.43, today is 2.47. Sodium 140, potassium 4.4, albumin 2.8, bilirubin 1, alkaline phosphatase 76, AST 17, ALT 11. WBC 6.5, hemoglobin 9.5, hematocrit 20.9. Hemoglobin on 03/24 was 9.9. Of note, platelets are 167. Urinalysis: 1+ blood, no nitrites but there is 3+ leukocyte esterase, 35 RBCs, and 3588 WBCs, no bacteria seen. X-ray of chest PA and lateral: Preliminary, there is no acute pneumonia. KUB from 04/11 double-J left ureteral stent present. Two calcification left pelvic area consistent with distal left ureteral stones and probable right nephrolithiasis. Degenerative changes of the lumbar spine. EKG: Sinus rhythm, rate is 66. No acute ST elevations or depressions.   ASSESSMENT AND PLAN: We have an 81 year old female with history of coronary artery disease, chronic kidney disease, hyperlipidemia, chronic anemia who had undergone left-sided ureterolithiasis status post lithotripsy and stent placement with recent hospitalizations for pyelonephritis and urinary tract infection and hydronephrosis, not currently on antibiotics who presents with progressive weakness, worsening renal failure, nausea and urinary tract infection per the urinalysis. She has received ceftriaxone and a liter of IV fluids here. At this point we will admit the patient to the hospital and start the patient on ceftriaxone. She states she was told by her doctors that she has orthostatic hypotension and gets dizzy when she  stands up. This likely is in the setting of poor p.o. intake and still taking medications including Imdur and Lasix likely. Would hold Imdur, Lasix and Exforge for now. Patient would be checked for orthostatic vital signs. We would check a renal ultrasound to rule out obstructive uropathy. Would also consult with urology. Patient states that the stent was scheduled to be removed on Monday and Dr. Thomasene Lot at the ER has talked to Dr. Yves Dill already who stated that it would still be scheduled for Monday. We would monitor the renal function closely. Would start the patient on Zofran for nausea and patient does appear to have some dehydration clinically. Would consult with physical therapy. Patient does have chronic anemia which appears to be stable. Would continue the  Crestor for the hyperlipidemia and start the patient on heparin with SCDs for deep vein thrombosis prophylaxis. Patient is DO NOT RESUSCITATE per patient and her son who is present in the room.   TOTAL TIME SPENT: 60 minutes.  ____________________________ Vivien Presto, MD sa:cms D: 08/12/2011 13:16:33 ET T: 08/12/2011 13:44:20 ET JOB#: [id number]  cc: Vivien Presto, MD, <Dictator> Kizzie Furnish. Prudence Davidson, [place] Otelia Limes. Yves Dill, MD Venetia Maxon. Elijio Miles, MD Vivien Presto MD ELECTRONICALLY SIGNED 08/13/2011 12:47

## 2014-08-18 NOTE — Consult Note (Signed)
Given her fever, stones, UTI she should be changed to admission from observation status.  Electronic Signatures: Manya Silvas (MD)  (Signed on 18-Mar-13 18:52)  Authored  Last Updated: 18-Mar-13 18:52 by Manya Silvas (MD)

## 2014-08-18 NOTE — Consult Note (Signed)
Brief Urology Consultation Report: For Consultation: ?Left Distal Ureteral Stone MD: Dr. Othella Boyer: Darcella Cheshire, M.D. LLQ Abd Pain (moderate) with increased Urinary Urgency and Nausea - clinically stable, currentlyc/w UTI with mild leukocytosis, but no left shiftLeft Ureteral Stone s/p ?partial laser fragmentation 06/28/2011 with Dr. Yves Dill -f/u ESW[facility] on 07/08/2011 with Dr. Yves Dill cancelled due to an inability to localize a stone, per pt/son  -KUB 07/08/2011: probable 52mm Left Distal Ureteral Stone  NPO (last food/liquids, yesterday morning, per pt/son)Stat Stone CT to r/o left ureteral obstruction    -if obstruction present, may need to consider stent replacement  Electronic Signatures: Darcella Cheshire (MD)  (Signed on 17-Mar-13 13:38)  Authored  Last Updated: 17-Mar-13 13:38 by Darcella Cheshire (MD)

## 2014-08-18 NOTE — Consult Note (Signed)
Impression:  81yo WF w/ h/o lung CA, s/p resection, breast CA, s/p right mastectomy, nephrolithiasis admitted with obstructive pyelonephritis, s/p ureteral stent placement with persistent fever.  Recommendations: 1) It is not uncommon for pts with pyelonephritis to have fever for many days despite appropriate antibiotics.  I suspect that her fever will resolve over time.  Her WBC has been decreasing daily. 2) She did not get blood cultures on admission, however.  Were she to have Enterococcal bacteremia, then ampicillin alone may not be adequate therapy.  Will get blood culture and repeat urine cultures. 3) Continue ampicillin. 4) If the above work up is negative, will eventually change her to amoxicillin for 2-3 weeks.  Electronic Signatures: Rasheka Denard, Heinz Knuckles (MD)  (Signed on 21-Mar-13 13:11)  Authored  Last Updated: 21-Mar-13 13:11 by Juandiego Kolenovic, Heinz Knuckles (MD)

## 2014-08-18 NOTE — Discharge Summary (Signed)
PATIENT NAME:  [name] [name], ELDEN MR#:  [id number] DATE OF BIRTH:  [birth year]  DATE OF ADMISSION:  07/13/2011 DATE OF DISCHARGE:  07/19/2010  DISCHARGE DIAGNOSES:  1. Pyelonephritis.  2. Hydronephrosis.  3. Acute on chronic kidney injury.   SECONDARY DIAGNOSES:  1. Coronary artery disease. 2. Hypertension.  3. Hyperlipidemia.   PROCEDURES: Left ureteric J-stent placement by Dr. Maudie Mercury.   CONSULTATIONS:  1. Urology, Dr. Delma Officer. 2. Infectious Disease, Dr. Legrand Como Blocker. 3. Nephrology, Dr. Anthonette Legato.   HOSPITAL COURSE: This lady was admitted through the Emergency Room with intractable nausea and vomiting four days prior to her visit. She had recently been discharged from [facility] on 06/29/2011 during which she was diagnosed with left ureterolithiasis and urinary tract infection. She underwent outpatient attempted lithotripsy by Dr. Yves Dill, who was unable to visualize the stones and advised the patient to continue antibiotics. On admission here she exhibited signs of pyelonephritis with high fever. She was started on empiric antibiotics, evaluated by Dr. Maudie Mercury, who promptly performed a left ureteric J-stent which resulted in improvement in her renal function and urine output. The patient'Dessire Grimes urine culture eventually grew enterococcus sensitive to ampicillin, and her antibiotics were changed. However, the patient'Shatonya Passon fever curves did not trend down immediately; and, as such, we consulted Infectious Disease, Dr. Clayborn Bigness. Following evaluation by Dr. Clayborn Bigness, he recommended continuation of ampicillin. She was then transitioned to p.o. amoxicillin, and her fever curve continued to trend downward; and she was afebrile throughout the remainder of her stay. The patient'Corazon Nickolas renal function was managed by the Nephrology Service. Following placement of the ureteric stent, her kidney function continued to improve. Urine output remained excellent, and at no point was there an indication of  possible dialysis. I discussed her case also with Dr. Yves Dill, who recommended completion of antibiotics for her pyelonephritis, after which he would attempt removal of her J stent and stones as an outpatient. At the time of discharge, the patient was tolerating a complete regular diet. Her pain had resolved. Her nausea had also pretty much resolved. She was severely deconditioned during this stay and was still not ambulating well despite physical therapy.   DISPOSITION: The patient was discharged to home with Home Health physical therapy.   DISCHARGE INSTRUCTIONS:  1. Diet: Low fat.  2. Activity: As tolerated.   FOLLOWUP:  1. Follow up with Dr. Yves Dill in 1 to 2 weeks.  2. Follow up with Dr. Holley Raring, Nephrology, in 1 to 2 weeks.   DISCHARGE MEDICATIONS:  1. Amoxicillin 500 mg t.i.d. x14 days.  2. Phenergan 25 mg p.r.n. every 6 hours for nausea.   NOTE: The patient is to continue withholding Plavix and aspirin until she undergoes ureteric procedure by Dr. Yves Dill in the next week or two. The patient is to resume other home medications.   DISCHARGE PROCESS TIME SPENT: 35 minutes.   ____________________________ Venetia Maxon Elijio Miles, MD sat:cbb D: 07/19/2011 13:53:20 ET T: 07/19/2011 15:11:41 ET JOB#: [id number]  cc: Sheikh A. Elijio Miles, MD, <Dictator> Veverly Fells MD ELECTRONICALLY SIGNED 07/22/2011 13:35

## 2014-08-18 NOTE — Consult Note (Signed)
PATIENT NAME:  [name], [name] MR#:  [id number] DATE OF BIRTH:  June 20, 1921  DATE OF CONSULTATION:  07/15/2011  REFERRING PHYSICIAN:  Dr. Elijio [name].  CONSULTING PHYSICIAN:  [name] [name]. [name] Isbell, MD  REASON FOR CONSULTATION: Obstructive pyelonephritis with persistent fever.   HISTORY OF PRESENT ILLNESS: The patient is an 81 year old white female with a past history significant for lung cancer, status post resection, breast cancer status post a right mastectomy and nephrolithiasis who presented with nausea, anorexia and malaise for several days. The patient had been admitted at the end of February with left-sided nephrolithiasis. She underwent lithotripsy and stent placement with plan for further evaluation as an outpatient. On re-evaluation and reimaging, no stones were seen. She subsequently developed her current symptoms with nausea without vomiting, anorexia, and malaise. She denies any fevers, chills, or sweats at home. However, she has had documented fever in the hospital. On admission she was found to have an elevated white count. She underwent repeat ureteral stent placement and has been feeling better. Cultures of the urine have grown enterococcus and she has been on ampicillin. Despite this, she has continued to have some low-grade fever. Her white count has been coming down over the last several days, however.   ALLERGIES: None.   PAST MEDICAL HISTORY:  1. Breast cancer status post right mastectomy. No chemotherapy or XRT.  2. Lung cancer, status post resection. No XRT or chemotherapy.  3. Nephrolithiasis with pyelonephritis. She was on Cipro prior to admission.  4. Coronary artery disease status post coronary artery bypass graft.  5. Hypertension.  6. Hypercholesterolemia.  7. Chronic renal insufficiency   SOCIAL HISTORY: The patient lives at home with her son. She does not drink. She does not smoke. No injecting drug use.   FAMILY HISTORY: Positive for hypertension.   REVIEW OF  SYSTEMS: GENERAL: She denies any fevers, chills, or sweats although she has had been febrile in the hospital. She has had fatigue and malaise. HEENT: No headaches. No sinus congestion. No sore throat. NECK: No stiffness. No swollen glands. RESPIRATORY: No cough or shortness of breath. No sputum production. CARDIAC: No chest pains or palpitations. No peripheral edema. GASTROINTESTINAL: Positive nausea, no vomiting, no abdominal pain. No diarrhea. She has had some constipation. GENITOURINARY: No dysuria or hematuria. She denies any flank pain. MUSCULOSKELETAL: No focal joint pain. SKIN: No rashes. NEUROLOGIC: No focal weakness. PSYCHIATRIC: No complaints. All other systems are negative.   PHYSICAL EXAMINATION:  VITAL SIGNS: T-max of 102.0, T-current 97.3, pulse 87, blood pressure 138/61, 97% on room air.   GENERAL: An 81 year old white female in no acute distress.   HEENT: Normocephalic, atraumatic. Pupils equal and reactive to light. Extraocular motion intact. Sclerae, conjunctiva, and lids did not appear to show any evidence for emboli or petechiae. Oropharynx shows no erythema or exudate. Gums are in fair condition.   NECK: Supple. Full range of motion. Midline trachea. No lymphadenopathy. No thyromegaly.   LUNGS: Clear to auscultation bilaterally with good air movement. No focal consolidation.   HEART: Regular rate and rhythm without murmur, rub, or gallop.   ABDOMEN: Soft, nontender, and nondistended. No hepatosplenomegaly. No hernias noted. No CVA tenderness bilaterally.   EXTREMITIES: No evidence for tenosynovitis.   SKIN: No rashes. No stigmata of endocarditis, specifically no Janeway lesions nor Osler nodes.   NEUROLOGIC: The patient was awake and interactive, moving all four extremities.   PSYCHIATRIC: Mood and affect appeared normal.   LABORATORY, DIAGNOSTIC, AND RADIOLOGICAL DATA: BUN 16, creatinine 1.66, bicarbonate 26,  anion gap 10. LFTs on admission were unremarkable. White  count is 11.6, hemoglobin 9.8, platelet count 167, ANC 9.6. White count on admission was 13.4 and rose to 15.2, but has been coming steadily down since then. A urinalysis from admission shows 1+ blood, 100 mg/dL of protein, negative nitrites, 3+ leukocyte esterase, two red cells and 110 white cells per high-powered field. Urine culture is growing greater than 100,000 CFUs per milliliter of enterococcus and greater than 100,000 CFUs per milliliter of coagulase-negative staph. No blood cultures were obtained on admission nor during her hospitalization. KUB showed distal left ureterolithiasis. A CT scan of the abdomen and pelvis without contrast from 03/17 demonstrates a 9 mm distal left ureteral calculus just proximal to the UVJ. There is a second 7 mm distal left ureteral calculus just proximal to the UVJ and a 3 mm calculus at the left UVJ. There was moderate left hydronephrosis. There is a small left pleural effusion with areas of loculation   IMPRESSION: This is an 81 year old white female with a history of lung cancer, status post resection, breast cancer status post right mastectomy and nephrolithiasis admitted with obstructive pyelonephritis status post ureteral stent placement with persistent fever.   RECOMMENDATIONS:  1. It is not uncommon for patients with pyelonephritis to have fever for many days despite appropriate antibiotics. I suspect that her fever will resolve over time. Her white count has been decreasing daily and she is overall feeling better.  2. She did not get blood cultures on admission, however. Were she to have enterococcal bacteremia, then ampicillin alone may not be adequate therapy. We will get blood cultures and repeat the urine cultures today.  3. Will continue ampicillin.  4. If the above work-up is negative, we will eventually change her to amoxicillin for 2 to 3 weeks.   Thank you very much for involving me in [name] [name]'s care. This is a moderately complex infectious  disease case.   ____________________________ [name] [name]. Mikena Masoner, MD meb:ap D: 07/15/2011 13:18:38 ET T: 07/15/2011 13:45:25 ET JOB#: [id number]  cc: [name] [name]. Islam Villescas, MD, <Dictator> Belma Dyches E Shamarion Coots MD ELECTRONICALLY SIGNED 07/16/2011 8:08

## 2014-08-18 NOTE — Consult Note (Signed)
WBC 15, T max 102, now 99.8.  No vomiting, nausea not present at this moment.  Abd non tender. Pt voices no new complaints.   Recommend admit to hospital and continue treatment of her complicated UTI.  Culture and sensitivity pending.   Electronic Signatures: Manya Silvas (MD)  (Signed on 19-Mar-13 07:51)  Authored  Last Updated: 19-Mar-13 07:51 by Manya Silvas (MD)

## 2014-08-18 NOTE — Consult Note (Signed)
Pt says nausea is 50% better, ate some food today, no abd pain or tenderness on exam.  She had enterococcus in urine and pyuria.  Still having temp spikes, last one yesterday.  I will sign off as it seems her symptoms are due to her complicated UTI.  Reconsult if needed.  Electronic Signatures: Manya Silvas (MD)  (Signed on 20-Mar-13 17:02)  Authored  Last Updated: 20-Mar-13 17:02 by Manya Silvas (MD)

## 2014-08-18 NOTE — Consult Note (Signed)
Brief Consult Note: Diagnosis: L ureterolithiasis.   Patient was seen by consultant.   Consult note dictated.   Recommend to proceed with surgery or procedure.   Recommend further assessment or treatment.   Orders entered.   Discussed with Attending MD.   Comments: Patient is afebrile and pain free now. Have scheduled stent placement and possible ureteroscopic lithotripsy Monday. Patient may go home if stable until Monday. Prescriptions written.  Electronic Signatures: Royston Cowper (MD)  (Signed 01-Mar-13 12:18)  Authored: Brief Consult Note   Last Updated: 01-Mar-13 12:18 by Royston Cowper (MD)

## 2014-08-18 NOTE — H&P (Signed)
PATIENT NAME:  [name] [name], [name] [name] MR#:  [id number] DATE OF BIRTH:  Dec 11, 1921  DATE OF ADMISSION:  06/24/2011  PRIMARY CARE PHYSICIAN: Dr. Hortencia Pilar REFERRING PHYSICIAN: Dr. Anthonette Legato   REASON FOR ADMISSION: Acute on chronic renal failure.   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 Elwyn Lade office because of worsening kidney function as well as hematuria and left hydroureter due to kidney stone. Apparently patient noted blood in her urine approximately two weeks ago. She was also having chills and intermittent pains in lower abdomen but denied and dysuria. She was given some medications and her hematuria resolved. She had CT scan of her abdomen and pelvis done on 02/27/213 and was noted to have left hydronephrosis. She was noted to have 9 mm stone in mid left ureter just above iliac crest and her labs showed worsening kidney function with creatinine of 2.59 from 1.50 on 05/30/2011. Patient is being admitted to the hospital for further evaluation. Her urine cultures were also taken in the office and patient was noted to have growth of Escherichia coli sensitive to all antibiotics according to medical records brought from Dr. Elwyn Lade office. Patient herself admits her as mentioned above of lower abdominal discomfort as well as some discomfort in left upper quadrant, left chest area, left flank but she attributes that left chest discomfort to her lobectomy which happened eight months ago.   PAST MEDICAL HISTORY:  1. History of chest pains, admission for the same for supraventricular tachycardia, palpitations 02/2011. She had a few more episodes of palpitations and approximately a month ago she was placed on metoprolol. Also she had episode of elevated troponin during the same  admission. 2. History of coronary artery disease, status post coronary artery bypass graft.  3. History of hyperlipidemia.  4. Hypertension.  5. Chronic kidney disease stage III to IV.  6. History of T2 N0 right breast carcinoma grade 1, ER as well as PR positive. She received adjuvant hormonal therapy with Arimidex as well as right mastectomy and sentinel lymph node biopsy in March 2005.   PAST SURGICAL HISTORY:  1. Appendectomy. 2. Left lower lobe lobectomy.   ALLERGIES: None.   CURRENT MEDICATIONS:  1. Hydralazine 100 mg p.o. twice daily and 25 mg at noon. 2. Exforge 10/320, 1 tablet daily.  3. Metoprolol ER 100 mg p.o. daily.  4. Clopidogrel 75 mg p.o. daily.  5. Isosorbide mononitrate 60 mg p.o. daily.  6. Lasix 20 mg p.o. daily.  7. Crestor 10 mg p.o. daily.  8. Aspirin 81 mg p.o. daily.  9. Gabapentin 300 mg p.o. at night.  10. Vitamin D3 2000 units daily.   SOCIAL HISTORY: No tobacco, alcohol or drug abuse.   FAMILY HISTORY: Hypertension.   REVIEW OF SYSTEMS: CONSTITUTIONAL: Positive for left flank area and chest area pains, some chills, fatigue and weakness, weight loss approximately 5 pounds since a week ago, glasses 24/7, supraventricular tachycardia intermittently, also constipation, poor appetite, hematuria which happened two weeks ago, neck pain also. Otherwise, denies any fevers, weight gain. EYES: In regards to eyes, denies any blurry vision, double vision, glaucoma, cataracts. ENT: Denies any tinnitus, allergies, epistaxis, sinus pain, dentures, difficulty swallowing. RESPIRATORY: Denies any cough, wheezes, asthma, chronic obstructive pulmonary disease.  CARDIOVASCULAR: No chest pains, orthopnea, edema, arrhythmias, or syncope. GASTROINTESTINAL: Denies nausea,  vomiting, diarrhea, rectal bleeding, change in bowel habits. GENITOURINARY: Denies any dysuria, frequency, or incontinence. ENDOCRINOLOGY: Denies any polydipsia, nocturia, thyroid problems, heat or cold  intolerance, or thirst. HEMATOLOGIC: Denies anemia, easy bruising, bleeding, swollen glands. SKIN: Denies any acne, rashes, lesions, change in moles. MUSCULOSKELETAL: Denies arthritis, cramps, swelling, gout. NEUROLOGIC: No numbness, epilepsy, tremor. PSYCH: Denies any anxiety, insomnia, depression.   PHYSICAL EXAMINATION: VITAL SIGNS: On arrival to hospital patient's vitals: Temperature 97.7, pulse 66, respiration rate 18, blood pressure 128/64, saturation 95% on room air at rest.   GENERAL: Well developed, well nourished Caucasian female with no significant distress, laying on the stretcher.   HEENT: Her pupils are equal, reactive light. Extraocular movements intact. No icterus or conjunctivitis. Has normal hearing. No pharyngeal erythema. Mucosa is dry.   NECK: Neck did not reveal any masses. Supple, nontender. Thyroid not enlarged. No adenopathy. No JVD or carotid bruits bilaterally. Full range of motion.   LUNGS: Clear to auscultation in all fields. Somewhat diminished breath sounds bilaterally. No wheezing. No labored inspirations, increased effort, dullness to percussion, overt respiratory distress.   CARDIOVASCULAR: S1, S2 appreciated. No murmurs, gallops, rubs noted. Rhythm is regular. PMI not lateralized. Chest is nontender to palpation. Patient does have healed incision in the left mid axillary area in the lower part of the chest which is somewhat tender to palpation.   EXTREMITIES: 1+ pedal pulses. No lower extremity edema, calf tenderness, or cyanosis was noted.   ABDOMEN: Soft, nontender. Bowel sounds are present. No hepatosplenomegaly or masses are noted.   RECTAL: Deferred.   MUSCULOSKELETAL: Able to move all extremities. No cyanosis, degenerative joint disease, or kyphosis. Gait is not tested.  SKIN: Skin did not reveal any rashes, lesions, erythema, nodularity, induration. It was warm and dry to palpation.   LYMPH: No adenopathy in cervical region.   NEUROLOGICAL: Cranial  nerves grossly intact. Sensory is intact. No dysarthria, aphasia.   PSYCH: Patient is alert, oriented to time, person, place, cooperative. Memory is good. No significant confusion, agitation, depression.   LABORATORY, DIAGNOSTIC AND RADIOLOGICAL DATA: CBC from 06/16/2011: WBC 12.6, hemoglobin 10.8, platelets 295 with elevated absolute neutrophil count at 9.7, glucose 96, BUN and creatinine 74 and 2.59. Estimated GFR for non-African American would be 16, sodium 135, potassium 5.0, bicarbonate 20. All other studies unremarkable. Patient did have urine cultures which showed greater than 100,000 colony-forming units of Escherichia coli sensitive to all antibiotics. CT of abdomen and pelvis without contrast on 06/23/2011 revealed moderate degree of proximal hydroureter and hydronephrosis, approximate 9 mm stone in mid left ureter just above the level of iliac crest. Urologic consultation was recommended. Requesting physician was paged during dictation of this report. Colonic diverticular disease without evidence of acute diverticulitis. Lung base areas of atelectasis or fibrosis on the left with small left pleural effusion which appears to be partially loculated. Basilar fibrosis is present in both lungs. Portable chest x-ray 05/30/2011: No significant changes were noted.    ASSESSMENT AND PLAN:  1. Acute on chronic renal failure likely due to urinary tract infection as well as urinary obstruction. Admit patient to medical floor. Continue patient on IV fluids as well as antibiotics. Will get urology as well as nephrology consultation. Will follow creatinine in the morning.  2. Left hydronephrosis due to 9 mm stone in ureter. Urology consultation will be requested.  3. Urinary tract infection Escherichia coli. Will continue patient on Levaquin IV.  4. Hematuria. Will check patient's CBC and will follow patient's hemoglobin daily.  Will also get urinalysis repeated to see if hematuria resolved.  5. Hyperkalemia  due to renal failure. We may need to initiate medications to help with hyperkalemia depending on patient's lab studies.   TIME SPENT: 50 minutes.  ____________________________ Theodoro Grist, MD rv:cms D: 06/24/2011 12:02:25 ET T: 06/24/2011 12:24:39 ET JOB#: [id number]  cc: Theodoro Grist, MD, <Dictator> Kerin Perna, MD Eryk Beavers MD ELECTRONICALLY SIGNED 06/26/2011 14:17

## 2014-08-18 NOTE — Discharge Summary (Signed)
PATIENT NAME:  [name] [name], CHISHOLM MR#:  [id number] DATE OF BIRTH:  09/16/1921  DATE OF ADMISSION:  08/12/2011 DATE OF DISCHARGE:  08/17/2011  DISCHARGE DIAGNOSES:  1. Urinary tract infection, complicated, with candiduria (Candida albicans) on urine culture in a patient with urologic stent. 2. Orthostatic hypotension.  3. Dehydration. 4. Physical deconditioning.  5. Hypomagnesemia.   DISCHARGE MEDICATIONS: Patient's medication regimen was adjusted extensively during her hospitalization. Please see discharge instructions for full details in this regard.   HOSPITAL COURSE: This patient is an 81 year old Caucasian female with history of recent hospitalizations for urinary tract infection, including a bout of pyelonephritis, who also has left ureterolithiasis and is status-post attempted lithotripsy which was only partially successful. Patient is also status-post stent placement for hydronephrosis related to the same. Patient has been following up with Dr. Yves Dill as an outpatient. Patient presented to the Emergency Room on date of admission with complaint of nausea and weakness. Please see history and physical examination from date of admission for full details regarding her initial presentation, triage, evaluation, and treatment. Patient was found to be dehydrated and to have orthostatic hypotension. Furthermore, her urine was suggestive of a urinary tract infection, so she was started empirically on ceftriaxone and blood cultures and urine cultures were sent in the standard fashion. Blood cultures were negative, but urine culture did grow out significant colony forming units of Candida albicans. Given the patient was symptomatic and she had recent urinary tract manipulation with hardware currently in place and an upcoming additional urologic procedure to hopefully remove said hardware, antifungal therapy with fluconazole was clearly warranted. Patient was started on fluconazole therapy in the standard  fashion, and she did tolerate this well. She was given rehydration with gentle IV fluids, which she also tolerated well. Additionally, her medications were adjusted extensively so as to hopefully prevent further episodes of orthostatic hypotension that might be partially resultant from her medications. Her orthostatic hypotension did resolve with these interventions.  Additionally, patient was found to be hypomagnesemic during her hospitalization. She was started on repletion therapy in the standard fashion, and will be continued on oral magnesium repletion as an outpatient. This will continue to be followed as an outpatient.   It is noted that over the past several months patient has shown physical deconditioning, so she will continue to work with physical therapy and was set up with the same as an outpatient so as to maximize her physical conditioning. However, she refused home physical therapy.   During her hospitalization, Dr. Yves Dill was consulted regarding the patient's care, including her candiduria, and Dr. Yves Dill deferred any urologic intervention until the patient had become "medically stable." He requested official medical clearance from Dr. Elijio Miles prior to any urologic intervention. Dr. Yves Dill intends to see the patient again this coming Monday, April 29, for hopeful removal of her stent. Dr. Elijio Miles has agreed to clear the patient for Dr. Letta Kocher procedure on April 29, and Dr. Elijio Miles awaits documentation from Dr. Yves Dill to this regard.  On date of discharge, patient was eager to return home, and was deemed to be in satisfactory condition for discharge from the hospital. She will continue her fluconazole as an outpatient, and will follow up with Dr. Yves Dill as scheduled, sooner if needed. Likewise, she will continue to receive close follow up care from her cardiology, internal medicine, and nephrology providers.   DIET: Low sodium, ADA diet.    ACTIVITY: As tolerated.   FOLLOW UP: Follow  up visit within seven days of  cardiology (Dr. Neoma Laming). Follow up within seven days internal medicine Wilnette Kales, PA-C, MSPAS). Follow up within two weeks with nephrology (Dr. Holley Raring). Follow up as scheduled with urology (Dr. Yves Dill). Follow up sooner if needed with any of these providers. Patient is also to follow up with her provider at [facility] regarding her pulmonary history, namely her history of lung cancer.   TIME SPENT: Discharge time spent including counseling, coordination of patient care, and the like was greater than 45 minutes.  ____________________________ Kizzie Furnish Audie Clear, MSPAS, dictating on behalf of Dr. Elijio Miles. mgm:cms D: 08/17/2011 05:14:17 ET T: 08/17/2011 10:42:50 ET JOB#: [id number]  cc: Kizzie Furnish. Prudence Davidson, PA, <Dictator> Elwyn Reach PA ELECTRONICALLY SIGNED 08/19/2011 15:46 Veverly Fells MD ELECTRONICALLY SIGNED 08/26/2011 12:36

## 2014-08-18 NOTE — Consult Note (Signed)
Brief Consult Note: Diagnosis: L ureterolithiasis s/p laser lithotripsy and stent placement.   Patient was seen by consultant.   Consult note dictated.   Recommend to proceed with surgery or procedure.   Orders entered.   Discussed with Attending MD.   Comments: Will postpone stent removal until patient is medically stable. Have instructed patient to call the office after discharge to reschedule.  Electronic Signatures: Royston Cowper (MD)  (Signed 18-Apr-13 17:58)  Authored: Brief Consult Note   Last Updated: 18-Apr-13 17:58 by Royston Cowper (MD)

## 2014-08-18 NOTE — Consult Note (Signed)
Brief Consult Note: Comments: Came to see patient. She is in OR. Will evaluate later.  Electronic Signatures: Jill Side (MD)  (Signed 04-Mar-13 12:29)  Authored: Brief Consult Note   Last Updated: 04-Mar-13 12:29 by Jill Side (MD)

## 2014-08-18 NOTE — Op Note (Signed)
PATIENT NAME:  [name] [name], [name] [name] MR#:  [id number] DATE OF BIRTH:  03-17-1922  DATE OF PROCEDURE:  07/11/2011  PREOPERATIVE DIAGNOSIS: Left distal ureteral calculi x3 with left hydronephrosis and urinary tract infection.   POSTOPERATIVE DIAGNOSIS: Left distal ureteral calculi x3 with left hydronephrosis and urinary tract infection.   PROCEDURES: 1. Cystourethroscopy with left retrograde pyelography (5 mL of a 50% dilute solution of Isovue).  2. Surgeon intraoperative interpretation of left retrograde pyelography.  3. Intraoperative cystogram (100 mL of a 50% dilute solution of Isovue).  4. Left double-J ureteral stent placement.   SURGEON: Darcella Cheshire, MD  ANESTHESIA: General.   ESTIMATED BLOOD LOSS: None.   COMPLICATIONS: None.   DRAINS:  1. 6 French x 26 cm left double-J ureteral stent.  2. 6 French Foley catheter to straight drainage.   PATHOLOGY SPECIMENS: None.   DESCRIPTION OF PROCEDURE: The patient was brought to the cystoscopy suite and after general anesthesia was established was placed in the dorsal lithotomy position and prepped and draped in usual sterile fashion. The 21 French cystoscope sheath was then inserted with an obturator. Pan cystoscopy was then performed with both the 30 and 70 degree lenses. The urethra was smooth without lesion or abnormality. Within the bladder, there are single ureteral orifices bilaterally. There were no intravesical stones. There were no bladder diverticula. A 0.035 guidewire was then placed through the cystoscope into the left ureteral orifice under direct vision. The guidewire met some subtle resistance just within the ureteral orifice, however, gentle pressure on the guidewire was successful in advancing the guidewire past the point of resistance and up into the region of the left renal pelvis under fluoroscopic guidance. A 6 French x 26 cm double-J ureteral stent was then advanced over the guidewire and once a good curl was obtained in the  renal pelvis, the guidewire was removed producing a good curl in the bladder. There was no visible urine draining around or through the double-J stent and so it was opted to perform an intraoperative cystogram to see if contrast would reflux up the stent and into the left collecting system to confirm appropriate placement. As such, a 69 French Foley catheter was placed into the bladder per urethra and 100 mL of a 50% dilute solution of Isovue was instilled via the Foley catheter. Fluoroscopy, however, did not reveal any contrast in the region of the left renal collecting system and so the bladder was drained and the Foley catheter withdrawn. The cystoscope was then inserted per urethra and the double-J stent grasped with the foreign body graspers and brought out through the urethra to the urethral meatus. The guidewire was then readvanced through the double-J stent into the region of the left renal pelvis under fluoroscopic guidance. The double-J stent was then withdrawn leaving the guidewire in place. A 6 French open-ended ureteral catheter was then advanced over the guidewire and the guidewire then withdrawn. A 50% dilute solution of Isovue was then injected via the open-ended catheter to perform a left retrograde pyelogram. 5 mL of the contrast mixture was utilized and achieved excellent opacification of the left renal collecting system. This confirmed appropriate placement of the guidewire within the left renal collecting system and so the guidewire was advanced through the open-ended ureteral catheter into the region of the left renal pelvis under fluoroscopic guidance and the open-ended catheter then withdrawn. The cystoscope was then reinserted per urethra back loading the guidewire. The 6 French x 26 cm ureteral stent was then advanced  over the guidewire and once a good curl was obtained in the renal pelvis, the guidewire was removed producing good curl in the bladder. Again, residual contrast in the  collecting system confirmed that the proximal coil of the double-J stent was sitting nicely within the left renal pelvis. The bladder was then left full and the cystoscope withdrawn. A 16 French Foley catheter was then placed per urethra and the balloon inflated with 10 mL of sterile water. The catheter was then placed to straight drainage and the patient taken out of the dorsal lithotomy position and extubated and transferred to the postanesthesia care unit in stable condition having tolerated the procedure well.   ____________________________ Darcella Cheshire, MD jhk:cms D: 07/11/2011 18:10:30 ET T: 07/12/2011 09:27:52 ET JOB#: [id number] cc: Darcella Cheshire, MD, <Dictator> Darcella Cheshire MD ELECTRONICALLY SIGNED 08/14/2011 15:57

## 2014-08-18 NOTE — Discharge Summary (Signed)
PATIENT NAME:  [name] [name], [name] [name] MR#:  [id number] DATE OF BIRTH:  29-Feb-1922  DATE OF ADMISSION:  09/01/2011 DATE OF DISCHARGE:  09/03/2011  DISCHARGE DIAGNOSES:  1. Urinary tract infection.  2. Pancytopenia. 3. Post-surgical neuropathic pain. 4. Acute on chronic kidney injury.   DISCHARGE MEDICATIONS: Patient is to resume all home medications. Please see discharge instructions for details in this regard. Additionally, the patient is to begin taking the following medications:  1. Gabapentin, 100 mg every morning and 100 mg at noon. 2. Patient is to increase her valsartan to 320 mg by mouth once daily. 3. Patient is to take Levaquin, 500 mg, 1 tab by mouth once a day until all tablets are taken to finish your treatment for pyelonephritis. 4. Fluconazole, 100 mg, 1 tab by mouth once a day until all tablets are taken to finish your course of treatment for pyelonephritis.  5. Clopidogrel/Plavix, 75 mg, 1 tablet by mouth once a day.  6. Aspirin, 81 mg, 1 tab by mouth once a day.   HOSPITAL COURSE: This patient is an 81 year old Caucasian female with past medical history of hypertension, chronic kidney disease, coronary artery disease, and a history of breast cancer and lung cancer being followed by [facility]. This patient has also had a recent medical history of complicated nephrolithiasis and hydronephrosis and several bouts of pyelonephritis related to these issues and the urologic intervention and instrumentation required to treat these issues. She presented to [facility] on date of admission with a chief complaint as noted in her history and physical examination from date of admission. She was status-post two days after her removal of her urologic stent by Dr. Yves Dill. Please see history and physical examination from date of admission for full details regarding her initial presentation, triage, evaluation, and treatment. Patient was found to have another  urinary tract infection, and in light of her recent urinary tract instrumentation, she was treated for presumptive pyelonephritis in the standard fashion. She was started empirically on Levaquin by the admitting hospitalist given empiric likelihood of this being a complicated urinary tract infection. In light of her recent candidal cystitis which required fluconazole treatment, she was also started back on fluconazole therapy appropriately. Patient was also found to be mildly dehydrated with prerenal acute on chronic kidney injury. Her acute on chronic kidney injury was managed with pushing p.o. fluids only, and the patient did not require intravenous fluid therapy.   With institution of the above treatments, the patient did show a rapid response, with appropriate and maintained response in clinical parameters as well, and on date of discharge, patient reported feeling that her symptoms were completely resolved and she was eager to return home. She was discharged on the remainder of her Levaquin and fluconazole therapy as aforementioned to complete her course of treatment for pyelonephritis.  During the patient's hospitalization, her CBC was followed regularly, and it was noted that the patient developed a mild pancytopenia. Evaluation for this was started during her hospitalization, and will continue as an outpatient with close follow-up care. Of note, her mild pancytopenia could be reflective of a mild transient myelosuppressive effect secondary to her acute illness, but one must also consider her history of breast cancer and lung cancer. Again, as aforementioned, this will be followed closely as an outpatient.   Overall, this patient's hospitalization was uncomplicated and she was discharged from the hospital in satisfactory condition.   DIET: Low salt, low cholesterol diet.   ACTIVITY: As tolerated.  FOLLOW UP: Follow up visit with Wilnette Kales PA-C of [facility] within seven days  of discharge, sooner if needed.   TIME SPENT: Discharge time spent including counseling, coordination of patient care, and the like was approximately 30 minutes.   ____________________________ Kizzie Furnish Audie Clear, MSPAS, dictating on behalf of Dr. Elijio Miles. mgm:cms D: 09/03/2011 16:00:14 ET T: 09/06/2011 11:36:58 ET JOB#: [id number]  cc: Kizzie Furnish. Prudence Davidson, PA, <Dictator> Elwyn Reach PA ELECTRONICALLY SIGNED 09/07/2011 18:15 Veverly Fells MD ELECTRONICALLY SIGNED 09/21/2011 13:18

## 2014-08-18 NOTE — Consult Note (Signed)
PATIENT NAME:  [name] [name], RYDEN MR#:  [id number] DATE OF BIRTH:  Jun 05, 1921  DATE OF CONSULTATION:  07/12/2011  CONSULTING PHYSICIAN:  Manya Silvas, MD  REASON FOR CONSULTATION/ HISTORY: The patient is an 81 year old white female who was admitted to the hospital. She was found to have a positive CAT scan yesterday for kidney stones in the left collecting system and moderate left hydronephrosis. She had stents placed and was found to have a urinary tract infection. She has been started on Cipro oral and Zosyn IV. I am asked to see her for weight loss and anorexia.   The patient has lost about 20 pounds over the last several weeks. She has eaten very little in the last five days. She absolutely flat out has no appetite, maybe ate a piece of toast three or four days ago. When she did eat or drink, it would go down the esophagus. There was no trouble with dysphagia. She says she has been nauseated most all the time in the last several days, but there is no vomiting. She says she was told she had a kidney stone problem two months ago and apparently this got worse, prompting the urological procedures today.   She does admit to having chills once in a while and may have had a temperature spike today. She denies any history of peptic ulcer disease. No heartburn, no abdominal pain, and no chest pain. She has never had an upper endoscopy. She had a colonoscopy several years ago.   PAST SURGICAL HISTORY:  1. Lung cancer surgery on the left lung at [facility] last year. She did not require chemotherapy or radiation. Her last CAT scan for this was a couple of months ago and it looked good.  2. Appendectomy.  3. Right mastectomy for cancer 8 to 10 years ago.  4. Placement of stents in the heart eight years ago.   PAST MEDICAL HISTORY:  1. Hypertension.  2. Hyperlipidemia.  3. Chronic kidney disease with creatinines running from 1.6 to 1.9. 4. Recurrent urinary tract infections.  5. Chronic anemia.      HOME MEDICATIONS:  1. Crestor 10 mg a day.  2. Lasix 20 mg a day.  3. Exforge 10/320 once a day. 4. Gabapentin 300 mg a day.  5. Metoprolol ER 1 tablet a day.  6. Vitamin D3, 2000 units a day.  7. Imdur Extended Release 120 mg a day.  8. Zofran 4 mg every 6 hours p.r.n.  9. Tapentadol 50 mg every 4 to 6 hours p.r.n.   NOTE: She has been on Plavix and aspirin in the past but has been off of them lately.   FAMILY HISTORY: Positive for mother with colon cancer, father with prostate cancer, two brothers with lung cancer who are smokers.   HABITS: She has never been a smoker or drinker.   ALLERGIES: No known drug allergies.   PHYSICAL EXAMINATION:  GENERAL: Elderly white female in no acute distress.   HEENT: Sclerae are anicteric. Tongue is negative.   NECK: No carotid bruits that I can hear.   CHEST: Clear.   HEART: No murmurs or gallops.   BREASTS: Exam not done.   ABDOMEN: Bowel sounds are present. No hepatosplenomegaly. No masses. No bruits. No significant tenderness.   EXTREMITIES: No edema.   SKIN: Warm and dry.  PSYCHIATRIC: Mood and affect are a little flat.    VITAL SIGNS: Temperature 102, pulse 98, respirations 18, blood pressure 124/70.   LABORATORY, DIAGNOSTIC AND RADIOLOGICAL DATA:  Greater than 100,000 gram-positive cocci.  CT of the abdomen and pelvis without contrast showed kidney stones on the  left, small left pleural effusion with areas of loculation, moderate left hydronephrosis.  Glucose 105, BUN 25, creatinine 1.9, repeat 1.65, sodium 138, potassium 100, CO2 32, calcium 8.9. PT 13.9.  White count 13.4, hemoglobin 10.4.  Troponin is 0.1. Lipase 135.  CPK and CPK-MB are negative.   ASSESSMENT: Her acute loss of appetite is likely related to urinary tract infection, especially given the hydronephrosis and the stones and the positive urine. There may be an element of depression. She has had serious medical problems in the last year. Sometimes  depression in elderly people can manifest as weight loss and loss of appetite.  I hear no indications at this time to perform an upper endoscopy or colonoscopy. I think I would continue to treat her for her urinary tract infection, especially given her temperature spike. She should be admitted to the hospital and continued on IV antibiotics. I would continue the omeprazole 20 mg  in the a.m.  I will follow along with you.   ____________________________ Manya Silvas, MD rte:cbb D: 07/12/2011 18:47:44 ET T: 07/12/2011 21:48:48 ET JOB#: [id number]  cc: Manya Silvas, MD, <Dictator> Manya Silvas MD ELECTRONICALLY SIGNED 07/22/2011 10:43

## 2014-08-18 NOTE — Consult Note (Signed)
Brief Consult Note: Diagnosis: Anemia.   Patient was seen by consultant.   Consult note dictated.   Discussed with Attending MD.   Comments: Patient with significant anemia which is probably multifactorial and at least in part related to chronic GI blood loss. No signs of active GI bleed. H and H stable after transfusion,  Recommendations: Follow H and H. Patient has been recommended a colonoscopy and EGD. She would like to go home tomorrow and have these procedures as OP. If she goes home tomorrow please give her an appointment with me next week in the office. If she stays untill Wednesday then we can consider the procedures her on Wednesday. Will check back tomorrow. Thanks.  Electronic Signatures: Jill Side (MD)  (Signed 04-Mar-13 18:38)  Authored: Brief Consult Note   Last Updated: 04-Mar-13 18:38 by Jill Side (MD)

## 2014-08-18 NOTE — Discharge Summary (Signed)
PATIENT NAME:  [name] [name], [name] [name] MR#:  [id number] DATE OF BIRTH:  November 29, 1921  DATE OF ADMISSION:  06/24/2011 DATE OF DISCHARGE:  06/29/2011  DISCHARGE DIAGNOSES:  1. Ureterolithiasis. 2. Complicated urinary tract infection/pyelonephritis  3. Hematuria. 4. Occult gastrointestinal blood loss.  5. Blood loss anemia secondary to the above two diagnoses.  6. History of coronary artery disease status post coronary artery bypass graft.  7. Hypertension.  8. Hyperlipidemia. 9. Acute kidney injury superimposed on chronic kidney disease.   DISCHARGE MEDICATIONS: Patient is to resume taking home medications as specified in her discharge instructions. Patient is to continue to hold her aspirin and Plavix/clopidogrel as per urology's strong recommendations that she do so. Urology was informed by both internal medicine and cardiology of the significant cardiovascular risks associated with holding both of these two medicines for prolonged periods of time. Urology still asserted that the risks of continued gross hematuria and probable resultant worsening of her anemia would still outweigh the potential secondary preventative benefits of continuing the patient on clopidogrel and aspirin. Urology will continue to assess the patient's urological progress and her ability to resume taking these two medications.   ADDITIONAL MEDICATION CHANGES ARE AS FOLLOWS:  1. Patient is to increase her Imdur to 120 mg, 1 tab by mouth once daily.  2. Patient is to take ciprofloxacin 500 mg 1 tab by mouth twice a day for eight more days.  3. Tapentadol 50 mg, 1 tab by mouth every 4 to 6 hours as needed for pain.  4. Ondansetron 4 mg, 1 tab by mouth up to every six hours as needed for nausea.  5. Docusate sodium 100 mg, 1 tab by mouth twice a day while taking tapentadol to avoid constipation.   HOSPITAL COURSE: Patient is an 81 year old Caucasian female with past medical history significant for chronic kidney disease, history of  nephrolithiasis, history of coronary artery disease status post CABG, hypertension, and hyperlipidemia who presented to [facility] with chief complaint as noted in the history and physical examination from date of admission. Please see history and physical examination from date of admission for full details regarding initial presentation, triage evaluation, and treatment. Patient was admitted to the hospital and was maintained on telemetry for a short duration due to her previous hospitalizations being associated with dysrhythmias. Patient was started on Levaquin for her urinary tract infection and was continued on the same during her hospital stay. She was transitioned to ciprofloxacin p.o. to continue as an outpatient for standard dosing in the standard fashion for pyelonephritis. Urology was consulted regarding the finding of a 9 mm stone in the left ureter and concomitant left hydronephrosis. Urology did proceed with left ureteral stent placing and attempted left ureteral lithotomy. Urology could not complete the procedure as planned, and thus stented the patient as aforementioned, but instead moved the stone back into the left kidney (as a temporizing measure to relieve the blockage and resultant hydronephrosis caused by the stone being in the ureter) with intents to complete outpatient lithotripsy next week.   Patient did have significant hematuria on presentation and CBC revealed anemia. The patient did ultimately require transfusion of 2 units of packed red blood cells during her hospital stay, and she had an appropriate rise in her hemoglobin and hematocrit. Due to her anemia, a stool sample was sent for occult blood which did test positive. Gastroenterology was thus consulted and gastroenterology recommended fiber-optic studies from above and below (esophagogastroduodenoscopy and colonoscopy) which are planned to be  done as an outpatient.   Patient was also noted to be in acute  kidney injury superimposed on her chronic renal failure secondary to post-renal causes as described above. This was managed in the standard fashion (again, see that which is noted above) and resolved without significant issue with treatments as described above.   Patient's blood pressure remained well controlled during her hospital stay without any issues.   Regarding the patient's coronary artery disease, urology advised holding Plavix and aspirin during the patient's hospital stay. Her Imdur was increased due to patient reporting chest pain the day after Plavix and aspirin were stopped. Urology was informed of this and continued to advise to hold Plavix and aspirin. Increase in Imdur did control the patient's symptoms. She reported no further cardiovascular symptoms during her stay. Urology proceeded with the procedure as described above, and the patient did report significant symptomatic improvement following this procedure.   On day of discharge, patient was cleared for discharge from internal medicine and urology standpoint. Internal medicine and cardiology did consult with urology again regarding continuing to hold the patient's aspirin and Plavix (see above regarding this discussion). As aforementioned, urology continued to assert that the risks of continued gross hematuria and the probable resultant provocation of her anemia would outweigh any potential secondary cardiovascular benefits from continuing her on these medications. Urology will continue to assess her urological progress and her ability to resume taking these two medications. Internal medicine and cardiology have advised she needs to resume taking these medications as soon as possible. Urology expressed understanding.   The patient was discharged from the hospital in satisfactory condition, and was eager to return home. She will maintain close follow up as an outpatient with her care providers.   DIET: Low salt, low cholesterol diet.    ACTIVITY: As tolerated.   FOLLOW UP: Follow-up visit with gastroenterology and urology within seven days, follow up with internal medicine within 7 to 10 days, and follow up with nephrology within four weeks.   TIME SPENT: Discharge time spent including coordination of care, patient education and the like was greater than 45 minutes.  ____________________________ Kizzie Furnish Audie Clear, MSPAS, dictating on behalf of Dr. Elijio Miles. mgm:cms D: 06/29/2011 14:20:44 ET T: 06/30/2011 10:18:30 ET JOB#: [id number]  cc: Kizzie Furnish. Prudence Davidson, PA, <Dictator> Elwyn Reach PA ELECTRONICALLY SIGNED 07/05/2011 19:43 Veverly Fells MD ELECTRONICALLY SIGNED 07/10/2011 12:29

## 2014-08-18 NOTE — Consult Note (Signed)
Pt seen for chronic nausea with acute loss of appetite over last 5 days now with UTI, stones in L ureter, hydronephrosis.  This could all be due to chronic urinary infection with acute worsening cause for anorexia of last 5 days.  Sometimes depression in elderly can manifest as loss of appetite and weight loss.  She had lung surgery for cancer last year and feels she may have not gotten over it.  See no need to do any endoscopic procedures at this time.  Will follow with you.  Electronic Signatures: Manya Silvas (MD)  (Signed on 18-Mar-13 18:37)  Authored  Last Updated: 18-Mar-13 18:37 by Manya Silvas (MD)

## 2014-08-18 NOTE — H&P (Signed)
PATIENT NAME:  [name] [name], [name] [name] MR#:  [id number] DATE OF BIRTH:  1921/10/04  DATE OF ADMISSION:  09/01/2011  REFERRING PHYSICIAN: Dr. Roxine Caddy   PRIMARY CARE PHYSICIAN: Dr. Elijio Miles   CHIEF COMPLAINT: Weakness, abdominal pain.   HISTORY OF PRESENT ILLNESS: [name] [name] is an 81 year old female who has been complaining of abdominal pain for one day. The patient has history of multiple urinary tract infections recently as well as history of urinary stent which was removed yesterday by Urology. Upon admission to the ED, the patient had a urinalysis done which came back as strongly positive for UTI and given the fact the patient had recent history of UTI she was started on IV Levaquin. As well she has history of recent fungal UTI. She was started on Diflucan. The patient was complaining of left-sided chest pain, pleuritic quality, mainly on inspiration and reproducible by palpation. The patient reports there is no palpitation, no shortness of breath, no nausea, and no diaphoresis accompanying the pain. It is mainly pleuritic. The patient was found to have evidence of left lower lung opacity by ED and was started on IV Levaquin for pneumonia. The patient had history of left lung surgery secondary to lung cancer and reports she does not take usually deep breaths. She was instructed to use her incentive spirometer which she has not been doing recently.   PAST MEDICAL HISTORY:  1. Left-sided ureterolithiasis status post lithotripsy and stent replacement by Dr. Yves Dill on 06/28/2011. 2. Complicated urinary tract infection with pyelo, finished multiple courses of antibiotics.  3. History of hematuria in the past.  4. History of chronic anemia.  5. Coronary artery disease, status post CABG.  6. Lung cancer, status post surgery last year.  7. Hypertension.  8. Hyperlipidemia.  9. Chronic kidney disease, baseline creatinine is about 1.6.  10. History of breast cancer, status post mastectomy.   11. Appendectomy.  12. Left lower lobe lobectomy.   SOCIAL HISTORY: Lives with her son. No tobacco, alcohol, or drug use.   ALLERGIES: None.   FAMILY HISTORY: Positive for hypertension.   HOME MEDICATIONS: 1. Colace 100 mg oral daily.  2. Crestor 10 mg oral daily.  3. Diflucan 200 mg oral daily.  4. Diovan 160 mg oral daily.  5. Gabapentin 300 mg at bedtime.  6. Imdur 60 mg oral daily.  7. Metoprolol ER 50 mg daily.  8. Flomax 2 tablets daily.  9. Vitamin D3 2000 units daily.   REVIEW OF SYSTEMS: The patient denies any fever, fatigue. Complains of weakness, abdominal pain. EYES: Denies any blurry vision, double vision, or pain. ENT: Denies any tinnitus, ear pain, hearing loss, allergy. RESPIRATORY: Denies any wheezing, hemoptysis, dyspnea. Complains of cough. CARDIOVASCULAR: Has left-sided chest pain. Denies any orthopnea, edema, arrhythmia, or palpitation. GI: Denies any nausea, vomiting, diarrhea. Complains of abdominal pain. GU: Denies any dysuria, hematuria, renal colic. ENDOCRINE: Denies any polyuria, polydipsia, heat or cold intolerance. INTEGUMENTARY: Denies any acne, rash, or itching. NEUROLOGIC: Denies any numbness, weakness, dysarthria, epilepsy. PSYCH: Denies any anxiety, insomnia, history of drug or alcohol abuse.   PHYSICAL EXAMINATION:   VITAL SIGNS: Temperature 100.6, pulse 82, respiratory rate 20, blood pressure 143/54, saturating 96% on room air.   GENERAL: Elderly female who is comfortable in bed in no apparent distress.   HEENT: Head atraumatic, normocephalic. Pupils equal, reactive to light. Pink conjunctivae. Anicteric sclerae. Moist oral mucosa.   NECK: Supple. No thyromegaly. No JVD.   CHEST: Good air entry bilaterally. No wheezing. Has left  lower lung rales and rhonchi.   CARDIOVASCULAR: S1, S2 heard. No rubs, murmur, gallops.   ABDOMEN: Soft, nontender, nondistended. Bowel sounds present.   EXTREMITIES: No edema. No clubbing. No cyanosis.   PERTINENT  LABS: Glucose 99, potassium 3.7, sodium 140, BUN 13, creatinine 1.39. Troponin 0.03. White blood cells 7.6, hemoglobin 10.2, hemoglobin 10.2, hematocrit 30.4, platelet 160,000. Urinalysis positive.   ASSESSMENT AND PLAN:  1. Urinary tract infection. The patient has fever of 100.6. Urine culture and blood culture will be sent. Will continue on IV Levaquin and will adjust antibiotic according to cultures. Given the fact the patient had recent Candiduria, will continue on Diflucan.  2. Pneumonia. Will continue patient on Levaquin. Will obtain blood cultures and adjust antibiotics accordingly.  3. Pleuritic chest pain. This is most likely secondary to pneumonia but given the patient's cardiac history will cycle cardiac enzymes. The patient does not have any EKG changes.  4. Hypertension. Will continue patient on metoprolol, Imdur, and Diovan.  5. History of chronic kidney disease. Creatinine is around baseline. Will continue her on Diovan.   CODE STATUS: The patient is FULL CODE but does not wish to remain on life support if  has poor prognosis.   TOTAL TIME SPENT ON PATIENT CARE AND HISTORY AND PHYSICAL: 55 minutes.   ____________________________ Albertine Patricia, MD dse:drc D: 09/01/2011 01:25:46 ET T: 09/01/2011 08:07:21 ET JOB#: [id number]  cc: Albertine Patricia, MD, <Dictator>, Venetia Maxon. Elijio Miles, MD Lorin Gawron Graciela Husbands MD ELECTRONICALLY SIGNED 09/01/2011 8:41

## 2014-08-18 NOTE — H&P (Signed)
PATIENT NAME:  [name] [name], SPERANZA MR#:  [id number] DATE OF BIRTH:  Feb 05, 1922  DATE OF ADMISSION:  07/11/2011  PRIMARY CARE PHYSICIAN:  Wilnette Kales, PA with Dr. Elijio Miles.   CHIEF COMPLAINT: Nausea, not able to eat anything for the last four days.   HISTORY OF PRESENT ILLNESS: [name] [name] is a pleasant 81 year old Caucasian female who was admitted from 02/28 until 03/05 when she had urinary tract infection and left ureterolithiasis. She underwent lithotripsy with stent placement in the left ureter. The patient went for a follow up with Dr. Yves Dill who repeated x-rays and the procedures and was told "no more stones are present". The patient is still continuing to take her Cipro. She comes in today with complaints of nausea for the last four days, very poor p.o. intake, feels weak and appears to be mildly clinically dehydrated on exam. Her urinalysis is still pending. She is being admitted for further evaluation and management. Her white count is 13.4. She is afebrile.   PAST MEDICAL HISTORY:  1. Left-sided ureterolithiasis status post lithotripsy and placement of stent by Dr. Yves Dill on 06/28/2011.  2. Complicated urinary tract infection/pyelonephritis. Currently on Cipro.  3. Hematuria. The patient has been off aspirin and Plavix per Dr. Letta Kocher recommendation and past admission.  4. Chronic anemia.  5. History of coronary artery disease status post coronary artery bypass graft.  6. Left-sided lung cancer status post surgery last year.  7. Hypertension.  8. Hyperlipidemia.  9. Chronic kidney disease, baseline creatinine around 1.6.  10. History of T2N0 right breast carcinoma status post mastectomy.  11. Appendectomy.  12. Left lower lobe lobectomy secondary with pathology suggestive of lung cancer.   SOCIAL HISTORY: Lives at home. No tobacco, alcohol or drug abuse.   FAMILY HISTORY: Positive for hypertension.    REVIEW OF SYSTEMS: CONSTITUTIONAL: No fever. Positive for fatigue, weakness. EYES:  No blurred or double vision. ENT: No tinnitus, ear pain, hearing loss. RESPIRATORY: No cough, wheeze, hemoptysis. CARDIOVASCULAR: No chest pain, orthopnea, or edema. GASTROINTESTINAL: Positive for nausea which has been chronic. No abdominal pain, diarrhea, or vomiting. GU: No dysuria or hematuria. ENDOCRINE: No polyuria or nocturia. HEMATOLOGY: Positive for chronic anemia. SKIN: No acne or rash. MUSCULOSKELETAL: Positive for arthritis. NEUROLOGIC: No cerebrovascular accident or transient ischemic attack.  PSYCH: No anxiety or depression. All other systems reviewed and negative.   HOME MEDICATIONS: According to the discharge of 03/05:  1. Crestor 10 mg daily.  2. Lasix 20 mg daily.  3. Exforge 10/320 p.o. daily.  4. Gabapentin 300 mg daily.  5. Metoprolol ER one tablet extended release daily.  6. Vitamin D3 5 2000 international units daily.  7. Imdur 120 mg p.o. daily, extended-release.  8. Zofran 4 mg every six hours as needed for nausea.  9. Docusate sodium 100 mg 1 capsule twice a day.  10. Cipro 500 mg p.o. b.i.d. for eight more days. Orders were for 06/29/2011.  11. Tapentadol 50 mg 1 tablet by mouth every 4 to 6 hours as needed.   I confirmed with the patient and her son, the patient is still off of aspirin and Plavix. She does not remember Dr. Yves Dill telling her to resume it.   PHYSICAL EXAMINATION:  GENERAL: The patient is awake. She is alert. She is oriented x3, not in acute distress.   VITAL SIGNS: She is afebrile, pulse 60, blood pressure 129/54, saturations are 93% to 96% on room air.   HEENT: Atraumatic, normocephalic. Pupils are equal, round, and reactive to  light and accommodation. Extraocular movements intact. Oral mucosa is dry.   NECK: Supple. No JVD. No carotid bruit.   LUNGS: Clear to auscultation bilaterally. No rales, rhonchi, respiratory distress, or labored breathing.   HEART: Both heart sounds are normal. Rate, rhythm is regular. PMI is not lateralized. Chest  nontender.   EXTREMITIES: Good pedal pulses, good femoral pulses. No lower extremity edema.   ABDOMEN: Soft, benign, and nontender. No organomegaly. Positive bowel sounds.   NEUROLOGIC: Grossly intact cranial nerves II through XII. No motor or sensory deficits.   PSYCH: The patient is awake, alert, and oriented x3.   SKIN: Warm and dry.   LABORATORY, DIAGNOSTIC AND RADIOLOGICAL DATA: Hemoglobin and hematocrit is 10.4 and 31.3, white count 13.4, platelet count 145. Glucose 123, BUN 29, creatinine 1.91, sodium 141, potassium 3.3, chloride 99, bicarbonate 32, calcium 8.7, bilirubin 0.9. LFTs within normal limits. Albumin is 2.4. Troponin 0.1. Lipase is 135. KUB done on 03/14 shows distal left ureterolithiasis.   ASSESSMENT AND PLAN:  81 year old [name] [name] with:  1. Generalized weakness, nausea and poor p.o. intake. The patient reports nausea on and off for one year. She has poor p.o. intake and symptoms of nausea for the past four days. The patient appears weak, clinically mildly dehydrated. She also does not remember getting any gastrointestinal work-up. Will start the patient on IV fluids, start p.o. PPI and consider work-up as inpatient or outpatient. This was discussed with Dr. Elijio Miles.  2. Mild dehydration, clinically. We will start IV fluids.  3. Left ureteral stones status post lithotripsy and stent placement on 03/04 by Dr. Yves Dill. KUB done on 03/14 shows distal ureteral stone, likely ready to be passed. However, I will have Dr. Maudie Mercury who is on call for urology see the patient. I doubt any other procedure will be done. I will continue to hold aspirin and Plavix since it has still not been reinitiated by Dr. Yves Dill as outpatient just in case she needs any procedure while in-house.  4. Chronic renal insufficiency, baseline creatinine around 1.6. The patient's creatinine today is 1.9.  5. We will give her some IV fluids.  6. Anemia, chronic.  7. Coronary artery disease. Denies any chest pain.  Elevated borderline troponin 0.1, likely in the setting of chronic kidney disease. Her Plavix and aspirin I will hold currently and will wait till urology decides on not doing any procedure, then resume it.  8. Hypertension. Continue amlodipine. Will start of combination which is Exforge. The patient is also on Imdur and was also started on hydralazine during last admission. I will hold off on hydralazine right now since blood pressure is quite stable at present.  9. Hyperlipidemia. Continue Crestor.  10. Deep vein thrombosis prophylaxis. I will give the patient TEDs. Her platelets have been a little bit on the lower side. I will hold off on any antiplatelet agents which is heparin or Lovenox at this time.  11. Care management for discharge planning.  12. Physical therapy for gait evaluation, ambulation and assessment for weakness.   The above was discussed with the patient's son who is agreeable to it. Further work-up according to the patient's clinical course. The patient has been signed off to Dr. Elijio Miles who will see her from tomorrow.   TIME SPENT: 50 minutes.  ____________________________ Hart Rochester Posey Pronto, MD sap:ap D: 07/11/2011 11:33:55 ET T: 07/11/2011 12:54:49 ET JOB#: [id number]  cc: Iann Rodier A. Posey Pronto, MD, <Dictator> Kizzie Furnish. Prudence Davidson, [place] Ilda Basset MD ELECTRONICALLY SIGNED 07/11/2011 13:40

## 2015-03-15 ENCOUNTER — Emergency Department
Admission: EM | Admit: 2015-03-15 | Discharge: 2015-03-15 | Disposition: A | Discharge status: Home / Self Care | Payer: Medicare Other | Attending: Emergency Medicine | Admitting: Emergency Medicine

## 2015-03-15 ENCOUNTER — Emergency Department: Payer: Medicare Other

## 2015-03-15 ENCOUNTER — Encounter: Payer: Self-pay | Admitting: Emergency Medicine

## 2015-03-15 DIAGNOSIS — R05 Cough: Secondary | ICD-10-CM | POA: Diagnosis present

## 2015-03-15 DIAGNOSIS — J209 Acute bronchitis, unspecified: Secondary | ICD-10-CM

## 2015-03-15 DIAGNOSIS — J40 Bronchitis, not specified as acute or chronic: Secondary | ICD-10-CM | POA: Diagnosis not present

## 2015-03-15 DIAGNOSIS — I10 Essential (primary) hypertension: Secondary | ICD-10-CM | POA: Insufficient documentation

## 2015-03-15 HISTORY — DX: Essential (primary) hypertension: I10

## 2015-03-15 LAB — COMPREHENSIVE METABOLIC PANEL
ALK PHOS: 87 U/L (ref 38–126)
ALT: 12 U/L — AB (ref 14–54)
AST: 18 U/L (ref 15–41)
Albumin: 3.7 g/dL (ref 3.5–5.0)
Anion gap: 7 (ref 5–15)
BUN: 24 mg/dL — AB (ref 6–20)
CALCIUM: 9.4 mg/dL (ref 8.9–10.3)
CHLORIDE: 107 mmol/L (ref 101–111)
CO2: 26 mmol/L (ref 22–32)
CREATININE: 0.95 mg/dL (ref 0.44–1.00)
GFR calc Af Amer: 59 mL/min — ABNORMAL LOW (ref >60)
GFR calc non Af Amer: 51 mL/min — ABNORMAL LOW (ref >60)
GLUCOSE: 90 mg/dL (ref 65–99)
Potassium: 4.3 mmol/L (ref 3.5–5.1)
SODIUM: 140 mmol/L (ref 135–145)
Total Bilirubin: 1.1 mg/dL (ref 0.3–1.2)
Total Protein: 7.1 g/dL (ref 6.5–8.1)

## 2015-03-15 LAB — CBC
HCT: 35.5 % (ref 35.0–47.0)
HEMOGLOBIN: 11.6 g/dL — AB (ref 12.0–16.0)
MCH: 29.6 pg (ref 26.0–34.0)
MCHC: 32.6 g/dL (ref 32.0–36.0)
MCV: 90.7 fL (ref 80.0–100.0)
PLATELETS: 157 10*3/uL (ref 150–440)
RBC: 3.91 MIL/uL (ref 3.80–5.20)
RDW: 12.9 % (ref 11.5–14.5)
WBC: 11 10*3/uL (ref 3.6–11.0)

## 2015-03-15 LAB — TROPONIN I: Troponin I: <0.03 ng/mL (ref <0.031)

## 2015-03-15 LAB — BRAIN NATRIURETIC PEPTIDE: B Natriuretic Peptide: 257 pg/mL — ABNORMAL HIGH (ref 0.0–100.0)

## 2015-03-15 MED ORDER — HYDROCODONE-ACETAMINOPHEN 5-325 MG PO TABS: 1.0000 | ORAL_TABLET | Freq: Four times a day (QID) | ORAL | Status: DC | PRN

## 2015-03-15 MED ORDER — TRAMADOL HCL 50 MG PO TABS
50.0000 mg | ORAL_TABLET | Freq: Once | ORAL | Status: AC
  Administered 2015-03-15: 50 mg via ORAL
  Filled 2015-03-15: qty 1

## 2015-03-15 MED ORDER — ALBUTEROL SULFATE HFA 108 (90 BASE) MCG/ACT IN AERS: 2.0000 | INHALATION_SPRAY | Freq: Four times a day (QID) | RESPIRATORY_TRACT | Status: DC | PRN

## 2015-03-15 MED ORDER — IPRATROPIUM-ALBUTEROL 0.5-2.5 (3) MG/3ML IN SOLN
3.0000 mL | Freq: Once | RESPIRATORY_TRACT | Status: AC
  Administered 2015-03-15: 3 mL via RESPIRATORY_TRACT
  Filled 2015-03-15: qty 3

## 2015-03-15 MED ORDER — FUROSEMIDE 40 MG PO TABS
20.0000 mg | ORAL_TABLET | Freq: Once | ORAL | Status: AC
  Administered 2015-03-15: 20 mg via ORAL
  Filled 2015-03-15: qty 2

## 2015-03-15 MED ORDER — FUROSEMIDE 20 MG PO TABS: 20.0000 mg | ORAL_TABLET | Freq: Every day | ORAL | Status: DC

## 2015-03-15 MED ORDER — HYDROCODONE-ACETAMINOPHEN 5-325 MG PO TABS
1.0000 | ORAL_TABLET | Freq: Once | ORAL | Status: AC
  Administered 2015-03-15: 1 via ORAL
  Filled 2015-03-15: qty 1

## 2015-03-15 NOTE — ED Provider Notes (Signed)
Time Seen: Approximately ----------------------------------------- 4:15 PM on 03/15/2015 -----------------------------------------    I have reviewed the triage notes  Chief Complaint: Cough   History of Present Illness: [name] [name] is a 81 y.o. female who presents with a referral from the local clinic for evaluation of some chest fluttering. Patient states she's had periodic fluttering over the last 2 weeks and had an episode earlier today that lasted less than a minute. Patient denies any persistent pain and states that her main concerns are persisting cough which is been dry and nonproductive over the last 2 weeks. Patient states she was on antibiotics which have been finished. The patient states she went to the clinic today for those to complaints of the persistent cough along with some right leg pain and has been diagnosed as sciatica. Patient states that the pain radiates down the back of her leg posteriorly from the lower back down through the hip down to the level of the knee. She denies any trouble with ambulation such as weakness she denies any fever or trauma. She denies any difficulty with urination or bowel movements. Patient denies any recent fever and denies any chest pain at this time. She denies any arm, jaw, upper back pain. She denies any shortness of breath, nausea, vomiting.   Past Medical History  Diagnosis Date  . Hypertension     There are no active problems to display for this patient.   Past Surgical History  Procedure Laterality Date  . Cardiac stents      Past Surgical History  Procedure Laterality Date  . Cardiac stents      No current outpatient prescriptions on file.  Allergies:  Review of patient's allergies indicates no known allergies.  Family History: No family history on file.  Social History: Social History  Substance Use Topics  . Smoking status: Never Smoker   . Smokeless tobacco: None  . Alcohol Use: No     Review of  Systems:   10 point review of systems was performed and was otherwise negative:  Constitutional: No fever Eyes: No visual disturbances ENT: No sore throat, ear pain Cardiac: No chest pain Respiratory: No shortness of breath, wheezing, or stridor Abdomen: No abdominal pain, no vomiting, No diarrhea Endocrine: No weight loss, No night sweats Extremities: No peripheral edema, cyanosis Skin: No rashes, easy bruising Neurologic: No focal weakness, trouble with speech or swollowing Urologic: No dysuria, Hematuria, or urinary frequency   Physical Exam:  ED Triage Vitals  Enc Vitals Group     BP 03/15/15 1128 212/69 mmHg     Pulse Rate 03/15/15 1128 65     Resp 03/15/15 1128 18     Temp 03/15/15 1128 98.3 F (36.8 C)     Temp src --      SpO2 03/15/15 1128 94 %     Weight 03/15/15 1128 154 lb (69.854 kg)     Height 03/15/15 1128 5\' 8"  (1.727 m)     Head Cir --      Peak Flow --      Pain Score --      Pain Loc --      Pain Edu? --      Excl. in [place]? --     General: Awake , Alert , and Oriented times 3; GCS 15 Head: Normal cephalic , atraumatic Eyes: Pupils equal , round, reactive to light Nose/Throat: No nasal drainage, patent upper airway without erythema or exudate.  Neck: Supple, Full range of motion,  No anterior adenopathy or palpable thyroid masses Lungs: Patient has some mild rhonchi right upper and right lower lobes without any wheezes or rales noted Heart: Regular rate, regular rhythm without murmurs , gallops , or rubs Abdomen: Soft, non tender without rebound, guarding , or rigidity; bowel sounds positive and symmetric in all 4 quadrants. No organomegaly .        Extremities: 2 plus symmetric pulses. No edema, clubbing or cyanosis Neurologic: normal ambulation, Motor symmetric without deficits, sensory intact Skin: warm, dry, no rashes Patient has some pain with straight leg raise down the back of her leg without any deficits.  Labs:   All laboratory work was  reviewed including any pertinent negatives or positives listed below:  Labs Reviewed  CBC - Abnormal; Notable for the following:    Hemoglobin 11.6 (*)    All other components within normal limits  COMPREHENSIVE METABOLIC PANEL - Abnormal; Notable for the following:    BUN 24 (*)    ALT 12 (*)    GFR calc non Af Amer 51 (*)    GFR calc Af Amer 59 (*)    All other components within normal limits  TROPONIN I  BRAIN NATRIURETIC PEPTIDE    EKG: ED ECG REPORT I, Daymon Larsen, the attending physician, personally viewed and interpreted this ECG.  Date: 03/15/2015 EKG Time: 1136 Rate: 65 Rhythm: normal sinus rhythm QRS Axis: normal Intervals: normal ST/T Wave abnormalities: normal Conduction Disutrbances: none Narrative Interpretation: unremarkable Mild LVH No acute ischemic changes   Radiology:    CLINICAL DATA: Chest pain.  EXAM: CHEST 2 VIEW  COMPARISON: Chest x-ray dated 01/02/2014 and chest CT dated 01/02/2014  FINDINGS: There is chronic borderline cardiomegaly. Diffuse chronic interstitial lung disease. Large chronic hiatal hernia along the left paraspinal area. Chronic scarring at the left lung base. No acute infiltrates or effusions. No acute osseous abnormality. Calcification in the thoracic aorta.  IMPRESSION: No acute abnormalities. Chronic lung disease. Aortic atherosclerosis.   I personally reviewed the radiologic studies    ED Course: Patient's stay was uneventful and she presents with multiple concerns. Her back pain I agree does seem to be an exacerbation of sciatica given the worsening pain with movement with radiation down her right leg. Tried some Ultram here in emergency department which did not offer much in the way of relief. She does not clinically have any signs or symptoms consistent with cauda equina syndrome. He is able to ambulate and has no focal weakness in the lower extremities and I'll prescribe her Vicodin on an outpatient  basis as this may help her with both her pain and her persistent cough. Her cough really hasn't improved though does not seem to be any signs of community-acquired pneumonia on chest x-ray or exam. Most likely has bronchitis though the sources may be multiple given a large hiatal hernia which may be causing reflux. She also has a mildly elevated BNP showed no focal findings consistent with congestive heart failure. Patient was started on some low dose diuretic to see if it would help her with her mild cough and shortness of breath. Her chest pain does not appear to be cardiogenic in nature with several days of intermittent "" fluttering transient chest discomfort with a negative EKG along with negative troponin I felt we could stop the investigation for acute coronary syndrome at this time also related to her cough and reflux symptoms. All findings were reviewed with the family and we felt we could attempt outpatient  management.   Assessment:  Acute bronchitis Mild nonspecific chest pain Sciatica    Plan:  Outpatient management Patient was advised to return immediately if condition worsens. Patient was advised to follow up with her primary care physician or other specialized physicians involved and in their current assessment.             Daymon Larsen, MD 03/15/15 [phone]

## 2015-03-15 NOTE — Discharge Instructions (Signed)
Upper Respiratory Infection, Adult Most upper respiratory infections (URIs) are a viral infection of the air passages leading to the lungs. A URI affects the nose, throat, and upper air passages. The most common type of URI is nasopharyngitis and is typically referred to as "the common cold." URIs run their course and usually go away on their own. Most of the time, a URI does not require medical attention, but sometimes a bacterial infection in the upper airways can follow a viral infection. This is called a secondary infection. Sinus and middle ear infections are common types of secondary upper respiratory infections. Bacterial pneumonia can also complicate a URI. A URI can worsen asthma and chronic obstructive pulmonary disease (COPD). Sometimes, these complications can require emergency medical care and may be life threatening.  CAUSES Almost all URIs are caused by viruses. A virus is a type of germ and can spread from one person to another.  RISKS FACTORS You may be at risk for a URI if:   You smoke.   You have chronic heart or lung disease.  You have a weakened defense (immune) system.   You are very young or very old.   You have nasal allergies or asthma.  You work in crowded or poorly ventilated areas.  You work in health care facilities or schools. SIGNS AND SYMPTOMS  Symptoms typically develop 2-3 days after you come in contact with a cold virus. Most viral URIs last 7-10 days. However, viral URIs from the influenza virus (flu virus) can last 14-18 days and are typically more severe. Symptoms may include:   Runny or stuffy (congested) nose.   Sneezing.   Cough.   Sore throat.   Headache.   Fatigue.   Fever.   Loss of appetite.   Pain in your forehead, behind your eyes, and over your cheekbones (sinus pain).  Muscle aches.  DIAGNOSIS  Your health care provider may diagnose a URI by:  Physical exam.  Tests to check that your symptoms are not due to  another condition such as:  Strep throat.  Sinusitis.  Pneumonia.  Asthma. TREATMENT  A URI goes away on its own with time. It cannot be cured with medicines, but medicines may be prescribed or recommended to relieve symptoms. Medicines may help:  Reduce your fever.  Reduce your cough.  Relieve nasal congestion. HOME CARE INSTRUCTIONS   Take medicines only as directed by your health care provider.   Gargle warm saltwater or take cough drops to comfort your throat as directed by your health care provider.  Use a warm mist humidifier or inhale steam from a shower to increase air moisture. This may make it easier to breathe.  Drink enough fluid to keep your urine clear or pale yellow.   Eat soups and other clear broths and maintain good nutrition.   Rest as needed.   Return to work when your temperature has returned to normal or as your health care provider advises. You may need to stay home longer to avoid infecting others. You can also use a face mask and careful hand washing to prevent spread of the virus.  Increase the usage of your inhaler if you have asthma.   Do not use any tobacco products, including cigarettes, chewing tobacco, or electronic cigarettes. If you need help quitting, ask your health care provider. PREVENTION  The best way to protect yourself from getting a cold is to practice good hygiene.   Avoid oral or hand contact with people with cold  symptoms.   Wash your hands often if contact occurs.  There is no clear evidence that vitamin C, vitamin E, echinacea, or exercise reduces the chance of developing a cold. However, it is always recommended to get plenty of rest, exercise, and practice good nutrition.  SEEK MEDICAL CARE IF:   You are getting worse rather than better.   Your symptoms are not controlled by medicine.   You have chills.  You have worsening shortness of breath.  You have brown or red mucus.  You have yellow or brown nasal  discharge.  You have pain in your face, especially when you bend forward.  You have a fever.  You have swollen neck glands.  You have pain while swallowing.  You have white areas in the back of your throat. SEEK IMMEDIATE MEDICAL CARE IF:   You have severe or persistent:  Headache.  Ear pain.  Sinus pain.  Chest pain.  You have chronic lung disease and any of the following:  Wheezing.  Prolonged cough.  Coughing up blood.  A change in your usual mucus.  You have a stiff neck.  You have changes in your:  Vision.  Hearing.  Thinking.  Mood. MAKE SURE YOU:   Understand these instructions.  Will watch your condition.  Will get help right away if you are not doing well or get worse.   This information is not intended to replace advice given to you by your health care provider. Make sure you discuss any questions you have with your health care provider.   Document Released: 10/06/2000 Document Revised: 08/27/2014 Document Reviewed: 07/18/2013 Elsevier Interactive Patient Education [employer].   Please return immediately if condition worsens. Please contact her primary physician or the physician you were given for referral. If you have any specialist physicians involved in her treatment and plan please also contact them. Thank you for using [facility].  It was felt that your symptoms were consistent with bronchitis. He may have some associated reflux and mild pulmonary edema. Please take over-the-counter medications for reflux. Please return emergency department especially for fever, increased cough especially a productive, increased chest pain, or any other new concerns.

## 2015-03-15 NOTE — ED Notes (Signed)
Went to clinic, had chest pain there, sent here for eval

## 2015-09-10 ENCOUNTER — Encounter: Payer: Self-pay | Admitting: *Deleted

## 2015-09-14 NOTE — H&P (Signed)
See scanned note.

## 2015-09-15 ENCOUNTER — Ambulatory Visit: Payer: Medicare Other | Admitting: Anesthesiology

## 2015-09-15 ENCOUNTER — Encounter
Admission: RE | Disposition: A | Discharge status: Home / Self Care | Payer: Self-pay | Source: Ambulatory Visit | Attending: Ophthalmology

## 2015-09-15 ENCOUNTER — Encounter: Payer: Self-pay | Admitting: *Deleted

## 2015-09-15 ENCOUNTER — Ambulatory Visit
Admission: RE | Admit: 2015-09-15 | Discharge: 2015-09-15 | Disposition: A | Discharge status: Home / Self Care | Payer: Medicare Other | Source: Ambulatory Visit | Attending: Ophthalmology | Admitting: Ophthalmology

## 2015-09-15 DIAGNOSIS — R011 Cardiac murmur, unspecified: Secondary | ICD-10-CM | POA: Insufficient documentation

## 2015-09-15 DIAGNOSIS — N189 Chronic kidney disease, unspecified: Secondary | ICD-10-CM | POA: Insufficient documentation

## 2015-09-15 DIAGNOSIS — Z955 Presence of coronary angioplasty implant and graft: Secondary | ICD-10-CM | POA: Diagnosis not present

## 2015-09-15 DIAGNOSIS — H2511 Age-related nuclear cataract, right eye: Secondary | ICD-10-CM | POA: Diagnosis present

## 2015-09-15 DIAGNOSIS — K219 Gastro-esophageal reflux disease without esophagitis: Secondary | ICD-10-CM | POA: Diagnosis not present

## 2015-09-15 DIAGNOSIS — Z853 Personal history of malignant neoplasm of breast: Secondary | ICD-10-CM | POA: Insufficient documentation

## 2015-09-15 DIAGNOSIS — I129 Hypertensive chronic kidney disease with stage 1 through stage 4 chronic kidney disease, or unspecified chronic kidney disease: Secondary | ICD-10-CM | POA: Diagnosis not present

## 2015-09-15 DIAGNOSIS — Z902 Acquired absence of lung [part of]: Secondary | ICD-10-CM | POA: Diagnosis not present

## 2015-09-15 DIAGNOSIS — I2510 Atherosclerotic heart disease of native coronary artery without angina pectoris: Secondary | ICD-10-CM | POA: Insufficient documentation

## 2015-09-15 DIAGNOSIS — Z9010 Acquired absence of unspecified breast and nipple: Secondary | ICD-10-CM | POA: Insufficient documentation

## 2015-09-15 DIAGNOSIS — M109 Gout, unspecified: Secondary | ICD-10-CM | POA: Insufficient documentation

## 2015-09-15 DIAGNOSIS — E7800 Pure hypercholesterolemia, unspecified: Secondary | ICD-10-CM | POA: Insufficient documentation

## 2015-09-15 HISTORY — DX: Gout, unspecified: M10.9

## 2015-09-15 HISTORY — DX: Chronic kidney disease, unspecified: N18.9

## 2015-09-15 HISTORY — DX: Palpitations: R00.2

## 2015-09-15 HISTORY — DX: Atherosclerotic heart disease of native coronary artery without angina pectoris: I25.10

## 2015-09-15 HISTORY — DX: Cardiac murmur, unspecified: R01.1

## 2015-09-15 HISTORY — DX: Gastro-esophageal reflux disease without esophagitis: K21.9

## 2015-09-15 HISTORY — PX: CATARACT EXTRACTION W/PHACO: SHX586

## 2015-09-15 HISTORY — DX: Diaphragmatic hernia without obstruction or gangrene: K44.9

## 2015-09-15 HISTORY — DX: Pure hypercholesterolemia, unspecified: E78.00

## 2015-09-15 HISTORY — DX: Malignant (primary) neoplasm, unspecified: C80.1

## 2015-09-15 SURGERY — PHACOEMULSIFICATION, CATARACT, WITH IOL INSERTION
Anesthesia: Monitor Anesthesia Care | Site: Eye | Laterality: Right | Wound class: Clean

## 2015-09-15 MED ORDER — EPINEPHRINE HCL 1 MG/ML IJ SOLN
INTRAMUSCULAR | Status: AC
  Filled 2015-09-15: qty 2

## 2015-09-15 MED ORDER — CEFUROXIME OPHTHALMIC INJECTION 1 MG/0.1 ML
INJECTION | OPHTHALMIC | Status: DC | PRN
  Administered 2015-09-15: .1 mL via INTRACAMERAL

## 2015-09-15 MED ORDER — EPINEPHRINE HCL 1 MG/ML IJ SOLN
INTRAOCULAR | Status: DC | PRN
  Administered 2015-09-15: 1 mL via OPHTHALMIC

## 2015-09-15 MED ORDER — CARBACHOL 0.01 % IO SOLN
INTRAOCULAR | Status: DC | PRN
  Administered 2015-09-15: .5 mL via INTRAOCULAR

## 2015-09-15 MED ORDER — MOXIFLOXACIN HCL 0.5 % OP SOLN
OPHTHALMIC | Status: DC | PRN
  Administered 2015-09-15: 1 [drp] via OPHTHALMIC

## 2015-09-15 MED ORDER — MOXIFLOXACIN HCL 0.5 % OP SOLN
1.0000 [drp] | OPHTHALMIC | Status: AC
  Administered 2015-09-15 (×3): 1 [drp] via OPHTHALMIC

## 2015-09-15 MED ORDER — HYALURONIDASE HUMAN 150 UNIT/ML IJ SOLN
INTRAMUSCULAR | Status: AC
  Filled 2015-09-15: qty 1

## 2015-09-15 MED ORDER — POVIDONE-IODINE 5 % OP SOLN
OPHTHALMIC | Status: AC
  Filled 2015-09-15: qty 30

## 2015-09-15 MED ORDER — POVIDONE-IODINE 5 % OP SOLN
OPHTHALMIC | Status: DC | PRN
  Administered 2015-09-15: 1 via OPHTHALMIC

## 2015-09-15 MED ORDER — TETRACAINE HCL 0.5 % OP SOLN
OPHTHALMIC | Status: DC | PRN
  Administered 2015-09-15: 1 [drp] via OPHTHALMIC

## 2015-09-15 MED ORDER — BSS IO SOLN
INTRAOCULAR | Status: DC | PRN
  Administered 2015-09-15: .5 mL via OPHTHALMIC

## 2015-09-15 MED ORDER — LIDOCAINE HCL (PF) 4 % IJ SOLN
INTRAMUSCULAR | Status: DC | PRN
  Administered 2015-09-15: 4 mL via OPHTHALMIC

## 2015-09-15 MED ORDER — NA CHONDROIT SULF-NA HYALURON 40-17 MG/ML IO SOLN
INTRAOCULAR | Status: DC | PRN
  Administered 2015-09-15: 1 mL via INTRAOCULAR

## 2015-09-15 MED ORDER — SODIUM CHLORIDE 0.9 % IV SOLN
INTRAVENOUS | Status: DC
  Administered 2015-09-15: 07:00:00 via INTRAVENOUS

## 2015-09-15 MED ORDER — PHENYLEPHRINE HCL 10 % OP SOLN
1.0000 [drp] | OPHTHALMIC | Status: AC
  Administered 2015-09-15 (×4): 1 [drp] via OPHTHALMIC

## 2015-09-15 MED ORDER — ALFENTANIL 500 MCG/ML IJ INJ
INJECTION | INTRAMUSCULAR | Status: DC | PRN
  Administered 2015-09-15: 500 ug via INTRAVENOUS

## 2015-09-15 MED ORDER — CYCLOPENTOLATE HCL 2 % OP SOLN
OPHTHALMIC | Status: AC
  Administered 2015-09-15: 1 [drp] via OPHTHALMIC
  Filled 2015-09-15: qty 2

## 2015-09-15 MED ORDER — PHENYLEPHRINE HCL 10 % OP SOLN
OPHTHALMIC | Status: AC
  Administered 2015-09-15: 1 [drp] via OPHTHALMIC
  Filled 2015-09-15: qty 5

## 2015-09-15 MED ORDER — CYCLOPENTOLATE HCL 2 % OP SOLN
1.0000 [drp] | OPHTHALMIC | Status: AC
  Administered 2015-09-15 (×4): 1 [drp] via OPHTHALMIC

## 2015-09-15 MED ORDER — LIDOCAINE HCL (PF) 4 % IJ SOLN
INTRAMUSCULAR | Status: AC
  Filled 2015-09-15: qty 5

## 2015-09-15 MED ORDER — NA CHONDROIT SULF-NA HYALURON 40-17 MG/ML IO SOLN
INTRAOCULAR | Status: AC
  Filled 2015-09-15: qty 1

## 2015-09-15 MED ORDER — CEFUROXIME OPHTHALMIC INJECTION 1 MG/0.1 ML
INJECTION | OPHTHALMIC | Status: AC
  Filled 2015-09-15: qty 0.1

## 2015-09-15 MED ORDER — MOXIFLOXACIN HCL 0.5 % OP SOLN
OPHTHALMIC | Status: AC
  Administered 2015-09-15: 1 [drp] via OPHTHALMIC
  Filled 2015-09-15: qty 3

## 2015-09-15 MED ORDER — TETRACAINE HCL 0.5 % OP SOLN
OPHTHALMIC | Status: AC
  Filled 2015-09-15: qty 2

## 2015-09-15 MED ORDER — BUPIVACAINE HCL (PF) 0.75 % IJ SOLN
INTRAMUSCULAR | Status: AC
  Filled 2015-09-15: qty 10

## 2015-09-15 SURGICAL SUPPLY — 29 items

## 2015-09-15 NOTE — Interval H&P Note (Signed)
History and Physical Interval Note:  09/15/2015 7:23 AM  [name] [name]  has presented today for surgery, with the diagnosis of cataract  The various methods of treatment have been discussed with the patient and family. After consideration of risks, benefits and other options for treatment, the patient has consented to  Procedure(s): CATARACT EXTRACTION PHACO AND INTRAOCULAR LENS PLACEMENT ([place]) (Right) as a surgical intervention .  The patient's history has been reviewed, patient examined, no change in status, stable for surgery.  I have reviewed the patient's chart and labs.  Questions were answered to the patient's satisfaction.     Davontae Prusinski

## 2015-09-15 NOTE — Transfer of Care (Signed)
Immediate Anesthesia Transfer of Care Note  Patient: HAILLEY SOMERSET  Procedure(s) Performed: Procedure(s) with comments: CATARACT EXTRACTION PHACO AND INTRAOCULAR LENS PLACEMENT ([facility]) (Right) - [place] 00:57.6 AP% 23.9 CDE 25.53 fluid pack lot # [id number]  Patient Location: PACU  Anesthesia Type:MAC  Level of Consciousness: awake, alert , oriented and patient cooperative  Airway & Oxygen Therapy: Patient Spontanous Breathing  Post-op Assessment: Report given to RN, Post -op Vital signs reviewed and stable and Patient moving all extremities X 4  Post vital signs: Reviewed and stable  Last Vitals:  Filed Vitals:   09/10/15 1446 09/15/15 0710  BP: 145/53 177/75  Pulse: 61 67  Temp:  36.8 C  Resp:  18    Last Pain: There were no vitals filed for this visit.       Complications: No apparent anesthesia complications

## 2015-09-15 NOTE — Anesthesia Preprocedure Evaluation (Signed)
Anesthesia Evaluation  Patient identified by MRN, date of birth, ID band Patient awake    Reviewed: Allergy & Precautions, NPO status , Patient's Chart, lab work & pertinent test results  History of Anesthesia Complications Negative for: history of anesthetic complications  Airway Mallampati: III       Dental  (+) Partial Lower   Pulmonary neg pulmonary ROS,           Cardiovascular hypertension, Pt. on medications + CAD  + Valvular Problems/Murmurs      Neuro/Psych negative neurological ROS     GI/Hepatic Neg liver ROS, hiatal hernia, GERD  Medicated and Controlled,  Endo/Other    Renal/GU CRF     Musculoskeletal   Abdominal   Peds  Hematology   Anesthesia Other Findings   Reproductive/Obstetrics                             Anesthesia Physical Anesthesia Plan  ASA: III  Anesthesia Plan: MAC   Post-op Pain Management:    Induction: Intravenous  Airway Management Planned: Nasal Cannula  Additional Equipment:   Intra-op Plan:   Post-operative Plan:   Informed Consent: I have reviewed the patients History and Physical, chart, labs and discussed the procedure including the risks, benefits and alternatives for the proposed anesthesia with the patient or authorized representative who has indicated his/her understanding and acceptance.     Plan Discussed with:   Anesthesia Plan Comments:         Anesthesia Quick Evaluation

## 2015-09-15 NOTE — Discharge Instructions (Signed)
AMBULATORY SURGERY  DISCHARGE INSTRUCTIONS   1) The drugs that you were given will stay in your system until tomorrow so for the next 24 hours you should not:  A) Drive an automobile B) Make any legal decisions C) Drink any alcoholic beverage   2) You may resume regular meals tomorrow.  Today it is better to start with liquids and gradually work up to solid foods.  You may eat anything you prefer, but it is better to start with liquids, then soup and crackers, and gradually work up to solid foods.   3) Please notify your doctor immediately if you have any unusual bleeding, trouble breathing, redness and pain at the surgery site, drainage, fever, or pain not relieved by medication.    4) Additional Instructions:        Please contact your physician with any problems or Same Day Surgery at [phone], Monday through Friday 6 am to 4 pm, or [place] at [facility] number at [phone]. Eye Surgery Discharge Instructions  Expect mild scratchy sensation or mild soreness. DO NOT RUB YOUR EYE!  The day of surgery:  Minimal physical activity, but bed rest is not required  No reading, computer work, or close hand work  No bending, lifting, or straining.  May watch TV  For 24 hours:  No driving, legal decisions, or alcoholic beverages  Safety precautions  Eat anything you prefer: It is better to start with liquids, then soup then solid foods.  _____ Eye patch should be worn until postoperative exam tomorrow.  ____ Solar shield eyeglasses should be worn for comfort in the sunlight/patch while sleeping  Resume all regular medications including aspirin or Coumadin if these were discontinued prior to surgery. You may shower, bathe, shave, or wash your hair. Tylenol may be taken for mild discomfort.  Call your doctor if you experience significant pain, nausea, or vomiting, fever > 101 or other signs of infection. [phone] or [phone] Specific  instructions:  Follow-up Information    Follow up with Estill Cotta, MD.   Specialty:  Ophthalmology   Why:  follow up 5/23 at [place] information:   [address]   [place] [address] [phone]

## 2015-09-15 NOTE — Anesthesia Postprocedure Evaluation (Signed)
Anesthesia Post Note  Patient: [name] [name]  Procedure(s) Performed: Procedure(s) (LRB): CATARACT EXTRACTION PHACO AND INTRAOCULAR LENS PLACEMENT (IOC) (Right)  Patient location during evaluation: PACU Anesthesia Type: MAC Level of consciousness: awake and alert and oriented Pain management: satisfactory to patient Vital Signs Assessment: post-procedure vital signs reviewed and stable Respiratory status: respiratory function stable Cardiovascular status: stable Anesthetic complications: no    Last Vitals:  Filed Vitals:   09/10/15 1446 09/15/15 0710  BP: 145/53 177/75  Pulse: 61 67  Temp:  36.8 C  Resp:  18    Last Pain: There were no vitals filed for this visit.               Silvana Newness A

## 2015-09-15 NOTE — Op Note (Signed)
Date of Surgery: 09/15/2015 Date of Dictation: 09/15/2015 9:31 AM Pre-operative Diagnosis:  Nuclear Sclerotic Cataract right Eye Post-operative Diagnosis: same Procedure performed: Extra-capsular Cataract Extraction (ECCE) with placement of a posterior chamber intraocular lens (IOL) right Eye IOL:  Implant Name Type Inv. Item Serial No. Manufacturer Lot No. LRB No. Used  LENS IOL ACRYSOF IQ 21.5 - [id number] Intraocular Lens LENS IOL ACRYSOF IQ 21.5 [id number] ALCON   Right 1   Anesthesia: 2% Lidocaine and 4% Marcaine in a 50/50 mixture with 10 unites/ml of Hylenex given as a peribulbar Anesthesiologist: Anesthesiologist: Gunnar Fusi, MD CRNA: Silvana Newness, CRNA Complications: none Estimated Blood Loss: less than 1 ml  Description of procedure:  The patient was given anesthesia and sedation via intravenous access. The patient was then prepped and draped in the usual fashion. A 25-gauge needle was bent for initiating the capsulorhexis. A 5-0 silk suture was placed through the conjunctiva superior and inferiorly to serve as bridle sutures. Hemostasis was obtained at the superior limbus using an eraser cautery. A partial thickness groove was made at the anterior surgical limbus with a 64 Beaver blade and this was dissected anteriorly with an [employer]. The anterior chamber was entered at 10 o'clock with a 1.0 mm paracentesis knife and through the lamellar dissection with a 2.6 mm Alcon keratome. Epi-Shugarcaine 0.5 CC [9 cc BSS Plus (Alcon), 3 cc 4% preservative-free lidocaine (Hospira) and 4 cc 1:1000 preservative-free, bisulfite-free epinephrine] was injected into the anterior chamber via the paracentesis tract. Epi-Shugarcaine 0.5 CC [9 cc BSS Plus (Alcon), 3 cc 4% preservative-free lidocaine (Hospira) and 4 cc 1:1000 preservative-free, bisulfite-free epinephrine] was injected into the anterior chamber via the paracentesis tract. DiscoVisc was injected to replace the aqueous and a  continuous tear curvilinear capsulorhexis was performed using a bent 25-gauge needle.  Balance salt on a syringe was used to perform hydro-dissection and phacoemulsification was carried out using a divide and conquer technique. Procedure(s) with comments: CATARACT EXTRACTION PHACO AND INTRAOCULAR LENS PLACEMENT (IOC) (Right) - [place] 00:57.6 AP% 23.9 CDE 25.53 fluid pack lot # [id number]. Irrigation/aspiration was used to remove the residual cortex and the capsular bag was inflated with DiscoVisc. The intraocular lens was inserted into the capsular bag using a pre-loaded UltraSert Delivery System. Irrigation/aspiration was used to remove the residual DiscoVisc. The wound was inflated with balanced salt and checked for leaks. None were found. Miostat was injected via the paracentesis track and 0.1 ml of cefuroxime containing 1 mg of drug  was injected via the paracentesis track. The wound was checked for leaks again and none were found.   The bridal sutures were removed and two drops of Vigamox were placed on the eye. An eye shield was placed to protect the eye and the patient was discharged to the recovery area in good condition.   Quetzally Callas MD

## 2016-04-29 ENCOUNTER — Emergency Department
Admission: EM | Admit: 2016-04-29 | Discharge: 2016-04-29 | Disposition: A | Discharge status: Home / Self Care | Payer: Medicare Other | Attending: Emergency Medicine | Admitting: Emergency Medicine

## 2016-04-29 ENCOUNTER — Encounter: Payer: Self-pay | Admitting: Emergency Medicine

## 2016-04-29 ENCOUNTER — Emergency Department: Payer: Medicare Other

## 2016-04-29 DIAGNOSIS — Z7982 Long term (current) use of aspirin: Secondary | ICD-10-CM | POA: Insufficient documentation

## 2016-04-29 DIAGNOSIS — N189 Chronic kidney disease, unspecified: Secondary | ICD-10-CM | POA: Insufficient documentation

## 2016-04-29 DIAGNOSIS — Z79899 Other long term (current) drug therapy: Secondary | ICD-10-CM | POA: Insufficient documentation

## 2016-04-29 DIAGNOSIS — Z853 Personal history of malignant neoplasm of breast: Secondary | ICD-10-CM | POA: Insufficient documentation

## 2016-04-29 DIAGNOSIS — I2510 Atherosclerotic heart disease of native coronary artery without angina pectoris: Secondary | ICD-10-CM | POA: Diagnosis not present

## 2016-04-29 DIAGNOSIS — R109 Unspecified abdominal pain: Secondary | ICD-10-CM | POA: Diagnosis not present

## 2016-04-29 DIAGNOSIS — I129 Hypertensive chronic kidney disease with stage 1 through stage 4 chronic kidney disease, or unspecified chronic kidney disease: Secondary | ICD-10-CM | POA: Insufficient documentation

## 2016-04-29 LAB — COMPREHENSIVE METABOLIC PANEL
ALBUMIN: 3.6 g/dL (ref 3.5–5.0)
ALK PHOS: 116 U/L (ref 38–126)
ALT: 11 U/L — AB (ref 14–54)
AST: 21 U/L (ref 15–41)
Anion gap: 10 (ref 5–15)
BILIRUBIN TOTAL: 1.1 mg/dL (ref 0.3–1.2)
BUN: 23 mg/dL — AB (ref 6–20)
CALCIUM: 9.7 mg/dL (ref 8.9–10.3)
CO2: 24 mmol/L (ref 22–32)
Chloride: 105 mmol/L (ref 101–111)
Creatinine, Ser: 1.16 mg/dL — ABNORMAL HIGH (ref 0.44–1.00)
GFR calc Af Amer: 46 mL/min — ABNORMAL LOW (ref >60)
GFR calc non Af Amer: 40 mL/min — ABNORMAL LOW (ref >60)
GLUCOSE: 84 mg/dL (ref 65–99)
Potassium: 4.3 mmol/L (ref 3.5–5.1)
Sodium: 139 mmol/L (ref 135–145)
TOTAL PROTEIN: 7.2 g/dL (ref 6.5–8.1)

## 2016-04-29 LAB — CBC
HEMATOCRIT: 35.4 % (ref 35.0–47.0)
Hemoglobin: 12.1 g/dL (ref 12.0–16.0)
MCH: 30.7 pg (ref 26.0–34.0)
MCHC: 34.2 g/dL (ref 32.0–36.0)
MCV: 90 fL (ref 80.0–100.0)
Platelets: 153 10*3/uL (ref 150–440)
RBC: 3.94 MIL/uL (ref 3.80–5.20)
RDW: 13.3 % (ref 11.5–14.5)
WBC: 7.3 10*3/uL (ref 3.6–11.0)

## 2016-04-29 LAB — LIPASE, BLOOD: Lipase: 30 U/L (ref 11–51)

## 2016-04-29 LAB — TROPONIN I: Troponin I: <0.03 ng/mL

## 2016-04-29 MED ORDER — SODIUM CHLORIDE 0.9 % IV BOLUS (SEPSIS)
500.0000 mL | Freq: Once | INTRAVENOUS | Status: AC
  Administered 2016-04-29: 500 mL via INTRAVENOUS

## 2016-04-29 MED ORDER — ACETAMINOPHEN 325 MG PO TABS
650.0000 mg | ORAL_TABLET | Freq: Once | ORAL | Status: AC
  Administered 2016-04-29: 650 mg via ORAL

## 2016-04-29 MED ORDER — ACETAMINOPHEN 325 MG PO TABS
ORAL_TABLET | ORAL | Status: AC
  Administered 2016-04-29: 650 mg via ORAL
  Filled 2016-04-29: qty 2

## 2016-04-29 MED ORDER — FENTANYL CITRATE (PF) 100 MCG/2ML IJ SOLN
25.0000 ug | Freq: Once | INTRAMUSCULAR | Status: AC
  Administered 2016-04-29: 25 ug via INTRAVENOUS
  Filled 2016-04-29: qty 0.5

## 2016-04-29 MED ORDER — IOPAMIDOL (ISOVUE-300) INJECTION 61%
75.0000 mL | Freq: Once | INTRAVENOUS | Status: AC | PRN
  Administered 2016-04-29: 75 mL via INTRAVENOUS
  Filled 2016-04-29: qty 75

## 2016-04-29 NOTE — ED Notes (Signed)
Patient transported to CT 

## 2016-04-29 NOTE — ED Notes (Signed)
Patient placed on bedpan to provide urine sample. 

## 2016-04-29 NOTE — ED Provider Notes (Addendum)
[facility] Emergency Department Provider Note  ____________________________________________   I have reviewed the triage vital signs and the nursing notes.   HISTORY  Chief Complaint Abdominal Pain    HPI [name] [name] is a 83 y.o. female resents today complaining of abdominal pain for several years. Her son states that this started with her other son died. He feels it may be a sign of depression. However, he does want to bring her in to be worked up. Patient states the pain is there off and on for years. Nothing makes it better nothing makes it worse. She states she has decreased appetite but her son states she's been eating just fine and she has been waited in her cardiologist with him over the last urine a half has not lost any weight despite constant complains of abdominal discomfort. She has had workup from her primary care doctor and been told that this is likely reflux disease.      Past Medical History:  Diagnosis Date  . Cancer ([place])    breast  . Chronic kidney disease   . Coronary artery disease   . GERD (gastroesophageal reflux disease)   . Gout   . HH (hiatus hernia)   . Hypercholesteremia   . Hypertension   . Murmur   . Palpitations     There are no active problems to display for this patient.   Past Surgical History:  Procedure Laterality Date  . APPENDECTOMY    . cardiac stents    . CATARACT EXTRACTION W/PHACO Right 09/15/2015   Procedure: CATARACT EXTRACTION PHACO AND INTRAOCULAR LENS PLACEMENT (IOC);  Surgeon: Estill Cotta, MD;  Location: [facility] ORS;  Service: Ophthalmology;  Laterality: Right;  [place] 00:57.6 AP% 23.9 CDE 25.53 fluid pack lot # [id number]  . cornary stent    . LOBECTOMY     lung  . MASTECTOMY      Prior to Admission medications   Medication Sig Start Date End Date Taking? Authorizing Provider  albuterol (PROVENTIL HFA;VENTOLIN HFA) 108 (90 BASE) MCG/ACT inhaler Inhale 2 puffs into the lungs every 6  (six) hours as needed for wheezing or shortness of breath. Patient not taking: Reported on 09/15/2015 03/15/15   Daymon Larsen, MD  aspirin 81 MG tablet Take 81 mg by mouth daily.    Historical Provider, MD  Celecoxib (CELEBREX PO) Take 1 tablet by mouth daily.    Historical Provider, MD  clopidogrel (PLAVIX) 75 MG tablet Take 75 mg by mouth daily.    Historical Provider, MD  dexlansoprazole (DEXILANT) 60 MG capsule Take 60 mg by mouth daily.    Historical Provider, MD  Docusate Sodium (COLACE PO) Take 2 capsules by mouth daily.    Historical Provider, MD  gabapentin (NEURONTIN) 100 MG capsule Take 100 mg by mouth 4 (four) times daily.    Historical Provider, MD  HYDRALAZINE HCL PO Take 1 tablet by mouth 2 (two) times daily.    Historical Provider, MD  LABETALOL HCL PO Take 1 tablet by mouth 2 (two) times daily.    Historical Provider, MD  rosuvastatin (CRESTOR) 10 MG tablet Take 10 mg by mouth daily.    Historical Provider, MD  valsartan (DIOVAN) 320 MG tablet Take 320 mg by mouth daily.    Historical Provider, MD    Allergies Patient has no known allergies.  History reviewed. No pertinent family history.  Social History Social History  Substance Use Topics  . Smoking status: Never Smoker  . Smokeless tobacco:  Not on file  . Alcohol use No    Review of Systems Constitutional: No fever/chills Eyes: No visual changes. ENT: No sore throat. No stiff neck no neck pain Cardiovascular: Denies chest pain. Respiratory: Denies shortness of breath. Gastrointestinal:   no vomiting.  No diarrhea.  No constipation. Genitourinary: Negative for dysuria. Musculoskeletal: Negative lower extremity swelling Skin: Negative for rash. Neurological: Negative for severe headaches, focal weakness or numbness. 10-point ROS otherwise negative.  ____________________________________________   PHYSICAL EXAM:  VITAL SIGNS: ED Triage Vitals  Enc Vitals Group     BP 04/29/16 1620 (!) 165/62      Pulse Rate 04/29/16 1620 74     Resp 04/29/16 1620 18     Temp 04/29/16 1620 98.7 F (37.1 C)     Temp Source 04/29/16 1620 Oral     SpO2 04/29/16 1620 94 %     Weight 04/29/16 1622 155 lb (70.3 kg)     Height 04/29/16 2032 5\' 5"  (1.651 m)     Head Circumference --      Peak Flow --      Pain Score 04/29/16 1622 10     Pain Loc --      Pain Edu? --      Excl. in [place]? --     Constitutional: Alert and oriented. Well appearing and in no acute distress. Eyes: Conjunctivae are normal. PERRL. EOMI. Head: Atraumatic. Nose: No congestion/rhinnorhea. Mouth/Throat: Mucous membranes are moist.  Oropharynx non-erythematous. Neck: No stridor.   Nontender with no meningismus Cardiovascular: Normal rate, regular rhythm. Grossly normal heart sounds.  Good peripheral circulation. Respiratory: Normal respiratory effort.  No retractions. Lungs CTAB. Abdominal: Soft and nontender. No distention. No guarding no rebound Back:  There is no focal tenderness or step off.  there is no midline tenderness there are no lesions noted. there is no Musculoskeletal: No lower extremity tenderness, no upper extremity tenderness. No joint effusions, no DVT signs strong distal pulses no edema Neurologic:  Normal speech and language. No gross focal neurologic deficits are appreciated.  Skin:  Skin is warm, dry and intact. No rash noted. Psychiatric: Mood and affect are normal. Speech and behavior are normal.  ____________________________________________   LABS (all labs ordered are listed, but only abnormal results are displayed)  Labs Reviewed  COMPREHENSIVE METABOLIC PANEL - Abnormal; Notable for the following:       Result Value   BUN 23 (*)    Creatinine, Ser 1.16 (*)    ALT 11 (*)    GFR calc non Af Amer 40 (*)    GFR calc Af Amer 46 (*)    All other components within normal limits  LIPASE, BLOOD  CBC  TROPONIN I  URINALYSIS, COMPLETE (UACMP) WITH MICROSCOPIC    ____________________________________________  EKG  I personally interpreted any EKGs ordered by me or triage Sinus rhythm at 72 bpm no acute ST elevation or acute ST depression normal axis unremarkable EKG ____________________________________________  RADIOLOGY  I reviewed any imaging ordered by me or triage that were performed during my shift and, if possible, patient and/or family made aware of any abnormal findings. ____________________________________________   PROCEDURES  Procedure(s) performed: None  Procedures  Critical Care performed: None  ____________________________________________   INITIAL IMPRESSION / ASSESSMENT AND PLAN / ED COURSE  Pertinent labs & imaging results that were available during my care of the patient were reviewed by me and considered in my medical decision making (see chart for details).  Nothing at this time  to suggest acute abdominal pathology. Because of her age I did a CT scan which is negative. There is no evidence of ischemic gut or other acute pathology causing the symptoms. It is not food related. She does not have a high white count, there is no evidence of a low-flow state otherwise, EKG is reassuring, vital signs and blood work are reassuring. I did a chest x-ray and EKG and a troponin just to make sure this wasn't referred abdominal/heart attack pain and it is not. CT scan is quite reassuring. We will have her follow-up as an outpatient. Her son is convinced that this is possibly a sign of depression, although she does not seem to have a depressed aspect is certainly possible. but she has no SI no HI and he is talked to her doctor about this already in the past.   ----------------------------------------- 9:51 PM on 04/29/2016 -----------------------------------------    . Patient did not give [place] a urine sample, we missed the one that she try to give [place]. We would have to do an in and out catheter reliably get one from her it appears.  I talked to the son and patient. They would prefer not to do this. She has no symptoms of urinary tract infection, and the pain has been there for urinary catheter. They would prefer not to wait. They understand without urinalysis I cannot rule out UTI as a contributing factor of her chronic pain and they're very kind with this. They will follow-up closely with her primary care doctor for a check and if she has dysuria or fever or any other new or worrisome symptoms  must come back.  Clinical Course    ____________________________________________   FINAL CLINICAL IMPRESSION(S) / ED DIAGNOSES  Final diagnoses:  None      This chart was dictated using voice recognition software.  Despite best efforts to proofread,  errors can occur which can change meaning.      Schuyler Amor, MD 04/29/16 [id number]    Schuyler Amor, MD 04/29/16 2155

## 2016-04-29 NOTE — ED Triage Notes (Signed)
C/o LLQ abdominal pain X 3-4 days. Has had some nausea but no diarrhea or vomiting. Feels like she is constipated; did have small bowel movement today or yesterday per pt. Denies urinary sx or fevers.

## 2016-04-30 MED ORDER — FENTANYL CITRATE (PF) 100 MCG/2ML IJ SOLN
INTRAMUSCULAR | Status: AC
  Filled 2016-04-30: qty 2

## 2016-07-01 ENCOUNTER — Encounter: Payer: Self-pay | Admitting: Emergency Medicine

## 2016-07-01 ENCOUNTER — Emergency Department
Admission: EM | Admit: 2016-07-01 | Discharge: 2016-07-01 | Disposition: A | Discharge status: Home / Self Care | Payer: Medicare Other | Attending: Emergency Medicine | Admitting: Emergency Medicine

## 2016-07-01 ENCOUNTER — Emergency Department: Payer: Medicare Other

## 2016-07-01 DIAGNOSIS — I129 Hypertensive chronic kidney disease with stage 1 through stage 4 chronic kidney disease, or unspecified chronic kidney disease: Secondary | ICD-10-CM | POA: Diagnosis not present

## 2016-07-01 DIAGNOSIS — Z7982 Long term (current) use of aspirin: Secondary | ICD-10-CM | POA: Diagnosis not present

## 2016-07-01 DIAGNOSIS — Z853 Personal history of malignant neoplasm of breast: Secondary | ICD-10-CM | POA: Diagnosis not present

## 2016-07-01 DIAGNOSIS — R5383 Other fatigue: Secondary | ICD-10-CM

## 2016-07-01 DIAGNOSIS — N189 Chronic kidney disease, unspecified: Secondary | ICD-10-CM | POA: Insufficient documentation

## 2016-07-01 DIAGNOSIS — K922 Gastrointestinal hemorrhage, unspecified: Secondary | ICD-10-CM | POA: Diagnosis not present

## 2016-07-01 DIAGNOSIS — Z79899 Other long term (current) drug therapy: Secondary | ICD-10-CM | POA: Diagnosis not present

## 2016-07-01 DIAGNOSIS — I2510 Atherosclerotic heart disease of native coronary artery without angina pectoris: Secondary | ICD-10-CM | POA: Insufficient documentation

## 2016-07-01 DIAGNOSIS — R195 Other fecal abnormalities: Secondary | ICD-10-CM

## 2016-07-01 DIAGNOSIS — R0789 Other chest pain: Secondary | ICD-10-CM | POA: Diagnosis present

## 2016-07-01 LAB — URINALYSIS, COMPLETE (UACMP) WITH MICROSCOPIC
Bacteria, UA: NONE SEEN
Bilirubin Urine: NEGATIVE
GLUCOSE, UA: NEGATIVE mg/dL
Hgb urine dipstick: NEGATIVE
Ketones, ur: 5 mg/dL — AB
Leukocytes, UA: NEGATIVE
Nitrite: NEGATIVE
PH: 5 (ref 5.0–8.0)
PROTEIN: NEGATIVE mg/dL
RBC / HPF: NONE SEEN RBC/hpf (ref 0–5)
Specific Gravity, Urine: 1.019 (ref 1.005–1.030)

## 2016-07-01 LAB — BASIC METABOLIC PANEL
Anion gap: 5 (ref 5–15)
BUN: 23 mg/dL — ABNORMAL HIGH (ref 6–20)
CALCIUM: 9.2 mg/dL (ref 8.9–10.3)
CO2: 28 mmol/L (ref 22–32)
CREATININE: 1.19 mg/dL — AB (ref 0.44–1.00)
Chloride: 103 mmol/L (ref 101–111)
GFR calc non Af Amer: 38 mL/min — ABNORMAL LOW (ref >60)
GFR, EST AFRICAN AMERICAN: 45 mL/min — AB (ref >60)
GLUCOSE: 108 mg/dL — AB (ref 65–99)
Potassium: 4 mmol/L (ref 3.5–5.1)
Sodium: 136 mmol/L (ref 135–145)

## 2016-07-01 LAB — TROPONIN I: Troponin I: <0.03 ng/mL

## 2016-07-01 LAB — CBC
HCT: 34.4 % — ABNORMAL LOW (ref 35.0–47.0)
Hemoglobin: 11.6 g/dL — ABNORMAL LOW (ref 12.0–16.0)
MCH: 30.2 pg (ref 26.0–34.0)
MCHC: 33.6 g/dL (ref 32.0–36.0)
MCV: 90 fL (ref 80.0–100.0)
PLATELETS: 167 10*3/uL (ref 150–440)
RBC: 3.82 MIL/uL (ref 3.80–5.20)
RDW: 14.3 % (ref 11.5–14.5)
WBC: 10.3 10*3/uL (ref 3.6–11.0)

## 2016-07-01 LAB — HEPATIC FUNCTION PANEL
ALK PHOS: 69 U/L (ref 38–126)
ALT: 13 U/L — ABNORMAL LOW (ref 14–54)
AST: 21 U/L (ref 15–41)
Albumin: 3.2 g/dL — ABNORMAL LOW (ref 3.5–5.0)
BILIRUBIN INDIRECT: 0.9 mg/dL (ref 0.3–0.9)
BILIRUBIN TOTAL: 1.1 mg/dL (ref 0.3–1.2)
Bilirubin, Direct: 0.2 mg/dL (ref 0.1–0.5)
TOTAL PROTEIN: 6.7 g/dL (ref 6.5–8.1)

## 2016-07-01 NOTE — ED Notes (Signed)
Pt resting in bed at this time. Respirations even and unlabored. Marcie Bal, EDT at bedside at this time. Pt denies any needs. Will continue to monitor for further patient needs.

## 2016-07-01 NOTE — ED Notes (Signed)
Lights dimmed for patient comforted, explained delay, pt states understanding. Will continue to monitor for further patient needs.

## 2016-07-01 NOTE — ED Provider Notes (Signed)
[facility] Emergency Department Provider Note ____________________________________________   I have reviewed the triage vital signs and the triage nursing note.  HISTORY  Chief Complaint Weakness   Historian Patient  HPI [name] [name] is a 83 y.o. female who lives at home by herself, but states that her children live nearby, presents stating that she feels like she is "declining "over the past 2 weeks. When I ask her to describe this further she states that she just really does not feel like eating. Initially she stated she was not having any pains, and then she stated that she was having some sort of discomfort and pointed to the lower lateral portion of her chest wall or even abdomen. Reports no specific nausea or vomiting. No diarrhea. She states that she has had decreased frequency of bowel movements. Last bowel movement was dark.  Denies chest pain or palpitations or trouble breathing. Denies recent fevers or viral symptoms.  When asked specifically about depression, she states that she doesn't really think so, although she then tells me that she would like to please just stay a day or 2 to be with others and eat.      Past Medical History:  Diagnosis Date  . Cancer ([place])    breast  . Chronic kidney disease   . Coronary artery disease   . GERD (gastroesophageal reflux disease)   . Gout   . HH (hiatus hernia)   . Hypercholesteremia   . Hypertension   . Murmur   . Palpitations     There are no active problems to display for this patient.   Past Surgical History:  Procedure Laterality Date  . APPENDECTOMY    . cardiac stents    . CATARACT EXTRACTION W/PHACO Right 09/15/2015   Procedure: CATARACT EXTRACTION PHACO AND INTRAOCULAR LENS PLACEMENT (IOC);  Surgeon: Estill Cotta, MD;  Location: [facility] ORS;  Service: Ophthalmology;  Laterality: Right;  [place] 00:57.6 AP% 23.9 CDE 25.53 fluid pack lot # [id number]  . cornary stent    . LOBECTOMY      lung  . MASTECTOMY      Prior to Admission medications   Medication Sig Start Date End Date Taking? Authorizing Provider  albuterol (PROVENTIL HFA;VENTOLIN HFA) 108 (90 BASE) MCG/ACT inhaler Inhale 2 puffs into the lungs every 6 (six) hours as needed for wheezing or shortness of breath. 03/15/15  Yes Daymon Larsen, MD  aspirin 81 MG tablet Take 81 mg by mouth daily.   Yes Historical Provider, MD  celecoxib (CELEBREX) 200 MG capsule Take 1 tablet by mouth daily.   Yes Historical Provider, MD  clopidogrel (PLAVIX) 75 MG tablet Take 75 mg by mouth daily.   Yes Historical Provider, MD  Docusate Sodium (COLACE PO) Take 2 capsules by mouth daily.   Yes Historical Provider, MD  hydrALAZINE (APRESOLINE) 25 MG tablet Take 1 tablet by mouth 2 (two) times daily.   Yes Historical Provider, MD  labetalol (NORMODYNE) 200 MG tablet Take 1 tablet by mouth 2 (two) times daily.   Yes Historical Provider, MD  rosuvastatin (CRESTOR) 20 MG tablet Take 20 mg by mouth daily.    Yes Historical Provider, MD  valsartan (DIOVAN) 320 MG tablet Take 320 mg by mouth daily.   Yes Historical Provider, MD  dexlansoprazole (DEXILANT) 60 MG capsule Take 60 mg by mouth daily.    Historical Provider, MD  gabapentin (NEURONTIN) 100 MG capsule Take 100 mg by mouth 4 (four) times daily.  Historical Provider, MD    No Known Allergies  History reviewed. No pertinent family history.  Social History Social History  Substance Use Topics  . Smoking status: Never Smoker  . Smokeless tobacco: Not on file  . Alcohol use No    Review of Systems  Constitutional: Negative for fever. Eyes: Negative for visual changes. ENT: Negative for sore throat. Cardiovascular: Negative for chest pain, but nondescript discomfort left lower chest. Respiratory: Negative for shortness of breath. Gastrointestinal: Negative for abdominal pain, vomiting and diarrhea. Genitourinary: Negative for dysuria.  1 black stool 2 days  ago. Musculoskeletal: Negative for back pain. Skin: Negative for rash. Neurological: Negative for headache. 10 point Review of Systems otherwise negative ____________________________________________   PHYSICAL EXAM:  VITAL SIGNS: ED Triage Vitals [07/01/16 0816]  Enc Vitals Group     BP      Pulse      Resp      Temp      Temp src      SpO2 94 %     Weight      Height      Head Circumference      Peak Flow      Pain Score      Pain Loc      Pain Edu?      Excl. in [place]?      Constitutional: Alert and oriented. Somewhat flat affect. HEENT   Head: Normocephalic and atraumatic.      Eyes: Conjunctivae are normal. PERRL. Normal extraocular movements.      Ears:         Nose: No congestion/rhinnorhea.   Mouth/Throat: Mucous membranes are mildly dry.   Neck: No stridor. Cardiovascular/Chest: Normal rate, regular rhythm.  No murmurs, rubs, or gallops. Respiratory: Normal respiratory effort without tachypnea nor retractions. Breath sounds are clear and equal bilaterally. No wheezes/rales/rhonchi. Gastrointestinal: Soft. No distention, no guarding, no rebound. Nontender to superficial depression or quadrants.  Genitourinary/rectal: Brown stool, Hemoccult positive Musculoskeletal: Nontender with normal range of motion in all extremities. No joint effusions.  No lower extremity tenderness.  No edema. Neurologic:  Normal speech and language. No gross or focal neurologic deficits are appreciated. Skin:  Skin is warm, dry and intact. No rash noted. Psychiatric: Flat affect, appears have depressed mood to me, although she states she does not think that she is depressed.   ____________________________________________  LABS (pertinent positives/negatives)  Labs Reviewed  BASIC METABOLIC PANEL - Abnormal; Notable for the following:       Result Value   Glucose, Bld 108 (*)    BUN 23 (*)    Creatinine, Ser 1.19 (*)    GFR calc non Af Amer 38 (*)    GFR calc Af Amer 45  (*)    All other components within normal limits  CBC - Abnormal; Notable for the following:    Hemoglobin 11.6 (*)    HCT 34.4 (*)    All other components within normal limits  URINALYSIS, COMPLETE (UACMP) WITH MICROSCOPIC - Abnormal; Notable for the following:    Color, Urine YELLOW (*)    APPearance CLEAR (*)    Ketones, ur 5 (*)    Squamous Epithelial / LPF 0-5 (*)    All other components within normal limits  HEPATIC FUNCTION PANEL - Abnormal; Notable for the following:    Albumin 3.2 (*)    ALT 13 (*)    All other components within normal limits  TROPONIN I    ____________________________________________  EKG I, Lisa Roca, MD, the attending physician have personally viewed and interpreted all ECGs.  73 bpm. Normal sinus rhythm. Narrow QRS. Normal axis. Nonspecific ST and T-wave ____________________________________________  RADIOLOGY All Xrays were viewed by me. Imaging interpreted by Radiologist.  Chest x-ray portable:  IMPRESSION: COPD with scarring in the lung bases.  No change from prior studies. __________________________________________  PROCEDURES  Procedure(s) performed: None  Critical Care performed: None  ____________________________________________   ED COURSE / ASSESSMENT AND PLAN  Pertinent labs & imaging results that were available during my care of the patient were reviewed by me and considered in my medical decision making (see chart for details).   Miss Belfield presents with nonspecific symptoms, when I pinned down it sounds like decreased motivation, decreased appetite, and may be some nondescript pressure or sensation in the left side of the upper abdomen or lower chest.  My overall impression of her symptoms seem more related to depression.  In any case, EKG is reassuring. Laboratory testing is reassuring. I did do a Hemoccult testing of her stool and it was positive. However she is hemodynamically stable with no tachycardia or  hypotension. Her hemoglobin is 11.6 and is somewhat consistent from prior noted 12.1 and 11.6.    I spoke with her son about hospital observation versus discharge home and follow-up with GI. We discussed return precautions.  I spoke with her son and also about her depression. She does have a lot of family support.  In terms of the GI bleeding, I asked her to stop the Plavix, and continue baby aspirin and an effort to split the difference from her cardiovascular risk and the GI bleeding risk.    CONSULTATIONS:   Dr. Allen Norris, GI, recommends discharge and outpatient follow-up as patient is hemodynamically stable.   Patient / Family / Caregiver informed of clinical course, medical decision-making process, and agree with plan.   I discussed return precautions, follow-up instructions, and discharge instructions with patient and/or family.   ___________________________________________   FINAL CLINICAL IMPRESSION(S) / ED DIAGNOSES   Final diagnoses:  Fatigue, unspecified type  Occult GI bleeding              Note: This dictation was prepared with Dragon dictation. Any transcriptional errors that result from this process are unintentional    Lisa Roca, MD 07/01/16 [phone]

## 2016-07-01 NOTE — Discharge Instructions (Signed)
You were evaluated for fatigue and although no certain cause was found, and your exam and evaluation are reassuring in the emergency department today. Return to the emergency department immediately for any new or worsening condition including chest pain, trouble breathing, fever, vomiting, black or bloody stool, or any other symptoms concerning to you.  Return for any dizziness or passing out.  Please follow up with primary care doctor as well as gastroenterologist.

## 2016-07-01 NOTE — ED Notes (Signed)
Pt given 2 warm blankets per request at this time. Will continue to monitor for further patient needs.

## 2016-07-01 NOTE — ED Triage Notes (Signed)
Pt presents to ED via [facility] from home where she lives alone. Per EMS pt c/o generalized weakness x " a few days". EMS states was dx with pneumonia " a few weeks ago" and was given abx. Pt has a hx of htn, was hypertensive on scene at 192/77. Pt presents alert and oriented at this time.

## 2017-07-27 ENCOUNTER — Other Ambulatory Visit: Payer: Self-pay

## 2017-07-27 ENCOUNTER — Emergency Department: Payer: Medicare Other

## 2017-07-27 ENCOUNTER — Emergency Department
Admission: EM | Admit: 2017-07-27 | Discharge: 2017-07-27 | Disposition: A | Discharge status: Home / Self Care | Payer: Medicare Other | Attending: Emergency Medicine | Admitting: Emergency Medicine

## 2017-07-27 DIAGNOSIS — I2510 Atherosclerotic heart disease of native coronary artery without angina pectoris: Secondary | ICD-10-CM | POA: Insufficient documentation

## 2017-07-27 DIAGNOSIS — Z7982 Long term (current) use of aspirin: Secondary | ICD-10-CM | POA: Diagnosis not present

## 2017-07-27 DIAGNOSIS — Z79899 Other long term (current) drug therapy: Secondary | ICD-10-CM | POA: Insufficient documentation

## 2017-07-27 DIAGNOSIS — R079 Chest pain, unspecified: Secondary | ICD-10-CM | POA: Diagnosis present

## 2017-07-27 DIAGNOSIS — Z7902 Long term (current) use of antithrombotics/antiplatelets: Secondary | ICD-10-CM | POA: Diagnosis not present

## 2017-07-27 DIAGNOSIS — N189 Chronic kidney disease, unspecified: Secondary | ICD-10-CM | POA: Insufficient documentation

## 2017-07-27 DIAGNOSIS — Z853 Personal history of malignant neoplasm of breast: Secondary | ICD-10-CM | POA: Diagnosis not present

## 2017-07-27 DIAGNOSIS — Z85118 Personal history of other malignant neoplasm of bronchus and lung: Secondary | ICD-10-CM | POA: Diagnosis not present

## 2017-07-27 DIAGNOSIS — I129 Hypertensive chronic kidney disease with stage 1 through stage 4 chronic kidney disease, or unspecified chronic kidney disease: Secondary | ICD-10-CM | POA: Diagnosis not present

## 2017-07-27 LAB — BASIC METABOLIC PANEL
Anion gap: 5 (ref 5–15)
BUN: 27 mg/dL — AB (ref 6–20)
CHLORIDE: 108 mmol/L (ref 101–111)
CO2: 28 mmol/L (ref 22–32)
CREATININE: 1.1 mg/dL — AB (ref 0.44–1.00)
Calcium: 9.2 mg/dL (ref 8.9–10.3)
GFR calc Af Amer: 49 mL/min — ABNORMAL LOW (ref >60)
GFR calc non Af Amer: 42 mL/min — ABNORMAL LOW (ref >60)
GLUCOSE: 98 mg/dL (ref 65–99)
Potassium: 4.5 mmol/L (ref 3.5–5.1)
SODIUM: 141 mmol/L (ref 135–145)

## 2017-07-27 LAB — CBC
HEMATOCRIT: 35 % (ref 35.0–47.0)
Hemoglobin: 11.7 g/dL — ABNORMAL LOW (ref 12.0–16.0)
MCH: 31.5 pg (ref 26.0–34.0)
MCHC: 33.5 g/dL (ref 32.0–36.0)
MCV: 93.9 fL (ref 80.0–100.0)
PLATELETS: 144 10*3/uL — AB (ref 150–440)
RBC: 3.72 MIL/uL — ABNORMAL LOW (ref 3.80–5.20)
RDW: 13.1 % (ref 11.5–14.5)
WBC: 7.4 10*3/uL (ref 3.6–11.0)

## 2017-07-27 LAB — TROPONIN I
Troponin I: <0.03 ng/mL (ref <0.03)
Troponin I: <0.03 ng/mL (ref <0.03)

## 2017-07-27 NOTE — ED Provider Notes (Signed)
Repeat troponin is negative, she is cleared for outpatient follow-up.   Earleen Newport, MD 07/27/17 [phone]

## 2017-07-27 NOTE — ED Provider Notes (Signed)
[facility] Emergency Department Provider Note   ____________________________________________   First MD Initiated Contact with Patient 07/27/17 [phone]     (approximate)  I have reviewed the triage vital signs and the nursing notes.   HISTORY  Chief Complaint Chest Pain    HPI [name] [name] is a 84 y.o. female who comes into the hospital today with an uncomfortable feeling in her chest.  The patient has a history of high blood pressure but her son reports that it has never been this high.  It is currently in the 106 systolic.  The patient's son reports that the patient has had some left lower lobe pain status post a lung lobectomy for lung cancer.  He reports that she has had this pain intermittently for 8 years but she typically does not take anything for pain.  The patient is unable to tell me exactly when this pain started.  She does not know if it woke her up out of sleep or if she had it before she went to sleep.  The patient lives at home alone and she called the ambulance.  She states that she was restless and when she moved her head she was concerned that she would not be able to see.  The patient rates her pain a 7 out of 10 in intensity.  She is also had breast cancer in the past as well.  The patient is here today for evaluation.  The patient's son states that she has been evaluated for this multiple times but he would like her evaluated and then dispositioned   Past Medical History:  Diagnosis Date  . Cancer ([place])    breast  . Chronic kidney disease   . Coronary artery disease   . GERD (gastroesophageal reflux disease)   . Gout   . HH (hiatus hernia)   . Hypercholesteremia   . Hypertension   . Murmur   . Palpitations     There are no active problems to display for this patient.   Past Surgical History:  Procedure Laterality Date  . APPENDECTOMY    . cardiac stents    . CATARACT EXTRACTION W/PHACO Right 09/15/2015   Procedure: CATARACT  EXTRACTION PHACO AND INTRAOCULAR LENS PLACEMENT (IOC);  Surgeon: Estill Cotta, MD;  Location: [facility] ORS;  Service: Ophthalmology;  Laterality: Right;  [place] 00:57.6 AP% 23.9 CDE 25.53 fluid pack lot # [id number]  . cornary stent    . LOBECTOMY     lung  . MASTECTOMY      Prior to Admission medications   Medication Sig Start Date End Date Taking? Authorizing Provider  aspirin 81 MG tablet Take 81 mg by mouth daily.   Yes [provider]  celecoxib (CELEBREX) 200 MG capsule Take 1 tablet by mouth daily.   Yes [provider]  clopidogrel (PLAVIX) 75 MG tablet Take 75 mg by mouth daily.   Yes [provider]  hydrALAZINE (APRESOLINE) 25 MG tablet Take 1 tablet by mouth 2 (two) times daily.   Yes [provider]  labetalol (NORMODYNE) 200 MG tablet Take 1 tablet by mouth 2 (two) times daily.   Yes [provider]  losartan (COZAAR) 100 MG tablet Take 1 tablet by mouth daily. 06/08/17  Yes [provider]  rosuvastatin (CRESTOR) 20 MG tablet Take 20 mg by mouth daily.    Yes [provider]  albuterol (PROVENTIL HFA;VENTOLIN HFA) 108 (90 BASE) MCG/ACT inhaler Inhale 2 puffs into the lungs every  6 (six) hours as needed for wheezing or shortness of breath. Patient not taking: Reported on 07/27/2017 03/15/15   Daymon Larsen, MD  dexlansoprazole (DEXILANT) 60 MG capsule Take 60 mg by mouth daily.    [provider]  Docusate Sodium (COLACE PO) Take 2 capsules by mouth daily.    [provider]  gabapentin (NEURONTIN) 100 MG capsule Take 100 mg by mouth 4 (four) times daily.    [provider]  valsartan (DIOVAN) 320 MG tablet Take 320 mg by mouth daily.    [provider]    Allergies Patient has no known allergies.  No family history on file.  Social History Social History   Tobacco Use  . Smoking status: Never Smoker  . Smokeless tobacco: Never Used  Substance Use Topics  . Alcohol use:  No  . Drug use: No    Review of Systems  Constitutional: No feve Eyes: No visual changes. ENT: No sore throat. Cardiovascular:  chest pain. Respiratory: Denies shortness of breath. Gastrointestinal: No abdominal pain.   Genitourinary: Negative for dysuria. Musculoskeletal: Negative for back pain. Skin: Negative for rash. Neurological: Negative for headaches  ____________________________________________   PHYSICAL EXAM:  VITAL SIGNS: ED Triage Vitals  Enc Vitals Group     BP 07/27/17 0350 (!) 206/65     Pulse Rate 07/27/17 0350 66     Resp 07/27/17 0350 15     Temp 07/27/17 0350 (!) 97.5 F (36.4 C)     Temp Source 07/27/17 0350 Oral     SpO2 --      Weight 07/27/17 0351 160 lb (72.6 kg)     Height 07/27/17 0351 5\' 8"  (1.727 m)     Head Circumference --      Peak Flow --      Pain Score --      Pain Loc --      Pain Edu? --      Excl. in [place]? --     Constitutional: Alert and oriented. Well appearing and in mild distress. Eyes: Conjunctivae are normal. PERRL. EOMI. Head: Atraumatic. Nose: No congestion/rhinnorhea. Mouth/Throat: Mucous membranes are moist.  Oropharynx non-erythematous. Cardiovascular: Normal rate, regular rhythm. Grossly normal heart sounds.  Good peripheral circulation. Respiratory: Normal respiratory effort.  No retractions. Lungs CTAB. Gastrointestinal: Soft and nontender. No distention.  Positive bowel sounds Musculoskeletal: No lower extremity tenderness nor edema.   Neurologic:  Normal speech and language.  Skin:  Skin is warm, dry and intact.  Psychiatric: Mood and affect are normal.   ____________________________________________   LABS (all labs ordered are listed, but only abnormal results are displayed)  Labs Reviewed  BASIC METABOLIC PANEL - Abnormal; Notable for the following components:      Result Value   BUN 27 (*)    Creatinine, Ser 1.10 (*)    GFR calc non Af Amer 42 (*)    GFR calc Af Amer 49 (*)    All other components  within normal limits  CBC - Abnormal; Notable for the following components:   RBC 3.72 (*)    Hemoglobin 11.7 (*)    Platelets 144 (*)    All other components within normal limits  TROPONIN I  TROPONIN I   ____________________________________________  EKG  ED ECG REPORT I, Loney Hering, the attending physician, personally viewed and interpreted this ECG.   Date: 07/27/2017  EKG Time: 340  Rate: 68  Rhythm: normal sinus rhythm  Axis: normal  Intervals:none  ST&T Change: none  ____________________________________________  RADIOLOGY  ED MD interpretation:  CXR: Cardiac enlargement with mild vascular congestion, no definite edema, small left effusion, probable scarring in the lungs  Official radiology report(s): Dg Chest Portable 1 View  Result Date: 07/27/2017 CLINICAL DATA:  Patient feels unwell with pushing on the chest for 2 weeks. History of hypertension and cardiac stents. Gastroesophageal reflux disease. Nonsmoker. EXAM: PORTABLE CHEST 1 VIEW COMPARISON:  07/01/2016 FINDINGS: Cardiac enlargement with mild vascular congestion. Mild interstitial pattern to the lungs is similar to prior study and likely represents fibrosis. Can't exclude early developing edema. Small left pleural effusion. No focal consolidation. No pneumothorax. Calcification of the aorta. Surgical clips in the right axilla. IMPRESSION: Cardiac enlargement with mild vascular congestion. No definite edema. Small left effusion. Probable scarring in the lungs. Aortic atherosclerosis. Electronically Signed   By: Lucienne Capers M.D.   On: 07/27/2017 04:46    ____________________________________________   PROCEDURES  Procedure(s) performed: None  Procedures  Critical Care performed: No  ____________________________________________   INITIAL IMPRESSION / ASSESSMENT AND PLAN / ED COURSE  As part of my medical decision making, I reviewed the following data within the electronic MEDICAL RECORD NUMBER  Notes from prior ED visits and [place] Controlled Substance Database   This is a 84 year old female who comes into the hospital today with some chest pain.  The patient is not very good at describing her symptoms or exactly when they started to what is going on.  The patient did ask her son to explain her situation.  He did then state that she has had chronic pain in her chest in the past and this sounds exactly like this pain.  The patient's son reports that the patient may have a little bit of dementia but he states that he would like her evaluated as well.  I did check a CBC a CMP and a troponin which were unremarkable.  I also checked a chest x-ray which was negative.  I will repeat the troponin and reassess the patient.     The patient's care will be signed out to Dr. Jimmye Norman will follow up the repeat troponin and reassess the patient.  He will then determine the patient's disposition. ____________________________________________   FINAL CLINICAL IMPRESSION(S) / ED DIAGNOSES  Final diagnoses:  Chest pain, unspecified type     ED Discharge Orders    None       Note:  This document was prepared using Dragon voice recognition software and may include unintentional dictation errors.    Loney Hering, MD 07/27/17 [phone]

## 2017-07-27 NOTE — ED Notes (Signed)
Pt ambulated to the toilet with one assist. Pt denies using a cane or a walker at home but per son, she has both but refuses to use.

## 2017-07-27 NOTE — ED Triage Notes (Signed)
Pt to the er via ems for feeling "unwell" with a pushing on the chest for 2 weeks, pt is poor historian

## 2017-08-03 ENCOUNTER — Emergency Department
Admission: EM | Admit: 2017-08-03 | Discharge: 2017-08-03 | Disposition: A | Discharge status: Home / Self Care | Payer: Medicare Other | Attending: Emergency Medicine | Admitting: Emergency Medicine

## 2017-08-03 ENCOUNTER — Encounter: Payer: Self-pay | Admitting: Emergency Medicine

## 2017-08-03 ENCOUNTER — Other Ambulatory Visit: Payer: Self-pay

## 2017-08-03 ENCOUNTER — Emergency Department: Payer: Medicare Other

## 2017-08-03 DIAGNOSIS — I129 Hypertensive chronic kidney disease with stage 1 through stage 4 chronic kidney disease, or unspecified chronic kidney disease: Secondary | ICD-10-CM | POA: Insufficient documentation

## 2017-08-03 DIAGNOSIS — Z7901 Long term (current) use of anticoagulants: Secondary | ICD-10-CM | POA: Diagnosis not present

## 2017-08-03 DIAGNOSIS — Z7982 Long term (current) use of aspirin: Secondary | ICD-10-CM | POA: Diagnosis not present

## 2017-08-03 DIAGNOSIS — I2510 Atherosclerotic heart disease of native coronary artery without angina pectoris: Secondary | ICD-10-CM | POA: Diagnosis not present

## 2017-08-03 DIAGNOSIS — F419 Anxiety disorder, unspecified: Secondary | ICD-10-CM

## 2017-08-03 DIAGNOSIS — Z853 Personal history of malignant neoplasm of breast: Secondary | ICD-10-CM | POA: Insufficient documentation

## 2017-08-03 DIAGNOSIS — R519 Headache, unspecified: Secondary | ICD-10-CM

## 2017-08-03 DIAGNOSIS — N189 Chronic kidney disease, unspecified: Secondary | ICD-10-CM | POA: Diagnosis not present

## 2017-08-03 DIAGNOSIS — R51 Headache: Secondary | ICD-10-CM | POA: Diagnosis not present

## 2017-08-03 LAB — CBC WITH DIFFERENTIAL/PLATELET
BASOS PCT: 1 %
Basophils Absolute: 0.1 10*3/uL (ref 0–0.1)
EOS ABS: 0.5 10*3/uL (ref 0–0.7)
Eosinophils Relative: 6 %
HCT: 35.7 % (ref 35.0–47.0)
HEMOGLOBIN: 11.9 g/dL — AB (ref 12.0–16.0)
Lymphocytes Relative: 12 %
Lymphs Abs: 0.9 10*3/uL — ABNORMAL LOW (ref 1.0–3.6)
MCH: 31.1 pg (ref 26.0–34.0)
MCHC: 33.3 g/dL (ref 32.0–36.0)
MCV: 93.4 fL (ref 80.0–100.0)
Monocytes Absolute: 0.9 10*3/uL (ref 0.2–0.9)
Monocytes Relative: 11 %
NEUTROS PCT: 70 %
Neutro Abs: 5.6 10*3/uL (ref 1.4–6.5)
Platelets: 140 10*3/uL — ABNORMAL LOW (ref 150–440)
RBC: 3.83 MIL/uL (ref 3.80–5.20)
RDW: 13.6 % (ref 11.5–14.5)
WBC: 8 10*3/uL (ref 3.6–11.0)

## 2017-08-03 LAB — COMPREHENSIVE METABOLIC PANEL
ALT: 16 U/L (ref 14–54)
AST: 22 U/L (ref 15–41)
Albumin: 3.5 g/dL (ref 3.5–5.0)
Alkaline Phosphatase: 63 U/L (ref 38–126)
Anion gap: 6 (ref 5–15)
BUN: 25 mg/dL — ABNORMAL HIGH (ref 6–20)
CALCIUM: 9.2 mg/dL (ref 8.9–10.3)
CHLORIDE: 106 mmol/L (ref 101–111)
CO2: 28 mmol/L (ref 22–32)
CREATININE: 1.14 mg/dL — AB (ref 0.44–1.00)
GFR calc non Af Amer: 40 mL/min — ABNORMAL LOW (ref >60)
GFR, EST AFRICAN AMERICAN: 47 mL/min — AB (ref >60)
Glucose, Bld: 104 mg/dL — ABNORMAL HIGH (ref 65–99)
Potassium: 3.9 mmol/L (ref 3.5–5.1)
Sodium: 140 mmol/L (ref 135–145)
Total Bilirubin: 1.1 mg/dL (ref 0.3–1.2)
Total Protein: 6.9 g/dL (ref 6.5–8.1)

## 2017-08-03 LAB — COOXEMETRY PANEL
Carboxyhemoglobin: 2.2 % — ABNORMAL HIGH (ref 0.5–1.5)
METHEMOGLOBIN: 1.5 % (ref 0.0–1.5)
O2 SAT: 95.2 %
TOTAL OXYGEN CONTENT: 92.3 mL/dL

## 2017-08-03 LAB — TROPONIN I: TROPONIN I: 0.03 ng/mL — AB (ref <0.03)

## 2017-08-03 NOTE — ED Provider Notes (Signed)
[facility] Emergency Department Provider Note  ____________________________________________   First MD Initiated Contact with Patient 08/03/17 0222     (approximate)  I have reviewed the triage vital signs and the nursing notes.   HISTORY  Chief Complaint Anxiety and Toxic Inhalation    HPI [name] [name] is a 84 y.o. female comes to the emergency department by EMS after waking up in smelling gas inside her home.  She developed a gradual onset not maximal onset bifrontal throbbing headache unlike headaches she is previously had.  When the fire department came to her home they inspected and found that there was no gas leak however the patient wanted to come to the emergency department for evaluation.  She denies chest pain or shortness of breath.  Her headache was gradual onset currently mild severity.  Nothing seems to make it better or worse.  Nonradiating.  Past Medical History:  Diagnosis Date  . Cancer ([place])    breast  . Chronic kidney disease   . Coronary artery disease   . GERD (gastroesophageal reflux disease)   . Gout   . HH (hiatus hernia)   . Hypercholesteremia   . Hypertension   . Murmur   . Palpitations     There are no active problems to display for this patient.   Past Surgical History:  Procedure Laterality Date  . APPENDECTOMY    . cardiac stents    . CATARACT EXTRACTION W/PHACO Right 09/15/2015   Procedure: CATARACT EXTRACTION PHACO AND INTRAOCULAR LENS PLACEMENT (IOC);  Surgeon: Estill Cotta, MD;  Location: [facility] ORS;  Service: Ophthalmology;  Laterality: Right;  [place] 00:57.6 AP% 23.9 CDE 25.53 fluid pack lot # [id number]  . cornary stent    . LOBECTOMY     lung  . MASTECTOMY      Prior to Admission medications   Medication Sig Start Date End Date Taking? Authorizing Provider  albuterol (PROVENTIL HFA;VENTOLIN HFA) 108 (90 BASE) MCG/ACT inhaler Inhale 2 puffs into the lungs every 6 (six) hours as needed for wheezing  or shortness of breath. Patient not taking: Reported on 07/27/2017 03/15/15   Daymon Larsen, MD  aspirin 81 MG tablet Take 81 mg by mouth daily.    [provider]  celecoxib (CELEBREX) 200 MG capsule Take 1 tablet by mouth daily.    [provider]  clopidogrel (PLAVIX) 75 MG tablet Take 75 mg by mouth daily.    [provider]  hydrALAZINE (APRESOLINE) 25 MG tablet Take 1 tablet by mouth 2 (two) times daily.    [provider]  labetalol (NORMODYNE) 200 MG tablet Take 1 tablet by mouth 2 (two) times daily.    [provider]  losartan (COZAAR) 100 MG tablet Take 1 tablet by mouth daily. 06/08/17   [provider]  rosuvastatin (CRESTOR) 20 MG tablet Take 20 mg by mouth daily.     [provider]    Allergies Patient has no known allergies.  No family history on file.  Social History Social History   Tobacco Use  . Smoking status: Never Smoker  . Smokeless tobacco: Never Used  Substance Use Topics  . Alcohol use: No  . Drug use: No    Review of Systems Constitutional: No fever/chills Eyes: No visual changes. ENT: No sore throat. Cardiovascular: Denies chest pain. Respiratory: Denies shortness of breath. Gastrointestinal: No abdominal pain.  No nausea, no vomiting.  No diarrhea.  No constipation. Genitourinary: Negative for dysuria. Musculoskeletal: Negative  for back pain. Skin: Negative for rash. Neurological: As of her headache  ____________________________________________   PHYSICAL EXAM:  VITAL SIGNS: ED Triage Vitals  Enc Vitals Group     BP      Pulse      Resp      Temp      Temp src      SpO2      Weight      Height      Head Circumference      Peak Flow      Pain Score      Pain Loc      Pain Edu?      Excl. in [place]?     Constitutional: Alert and oriented x4 somewhat anxious appearing nontoxic no diaphoresis speaks full clear sentences Eyes: PERRL EOMI. Head: Atraumatic. Nose: No  congestion/rhinnorhea. Mouth/Throat: No trismus Neck: No stridor.   Cardiovascular: Normal rate, regular rhythm. Grossly normal heart sounds.  Good peripheral circulation. Respiratory: Normal respiratory effort.  No retractions. Lungs CTAB and moving good air Gastrointestinal: Soft nontender Musculoskeletal: No lower extremity edema   Neurologic:  Normal speech and language. No gross focal neurologic deficits are appreciated. Skin:  Skin is warm, dry and intact. No rash noted. Psychiatric: Mildly anxious.   ____________________________________________   DIFFERENTIAL includes but not limited to  Intracerebral hemorrhage, carbon monoxide exposure, dehydration, anxiety ____________________________________________   LABS (all labs ordered are listed, but only abnormal results are displayed)  Labs Reviewed  COMPREHENSIVE METABOLIC PANEL - Abnormal; Notable for the following components:      Result Value   Glucose, Bld 104 (*)    BUN 25 (*)    Creatinine, Ser 1.14 (*)    GFR calc non Af Amer 40 (*)    GFR calc Af Amer 47 (*)    All other components within normal limits  CBC WITH DIFFERENTIAL/PLATELET - Abnormal; Notable for the following components:   Hemoglobin 11.9 (*)    Platelets 140 (*)    Lymphs Abs 0.9 (*)    All other components within normal limits  TROPONIN I - Abnormal; Notable for the following components:   Troponin I 0.03 (*)    All other components within normal limits  COOXEMETRY PANEL - Abnormal; Notable for the following components:   Carboxyhemoglobin 2.2 (*)    All other components within normal limits    Lab work reviewed by me with no evidence of carbon monoxide toxicity __________________________________________  EKG  ED ECG REPORT I, Darel Hong, the attending physician, personally viewed and interpreted this ECG.  Date: 08/04/2017 EKG Time:  Rate: 63 Rhythm: normal sinus rhythm QRS Axis: normal Intervals: normal ST/T Wave abnormalities:  normal Narrative Interpretation: no evidence of acute ischemia  ____________________________________________  RADIOLOGY  Head CT reviewed by me with no acute disease ____________________________________________   PROCEDURES  Procedure(s) performed: no  Procedures  Critical Care performed: no  Observation: no ____________________________________________   INITIAL IMPRESSION / ASSESSMENT AND PLAN / ED COURSE  Pertinent labs & imaging results that were available during my care of the patient were reviewed by me and considered in my medical decision making (see chart for details).  The patient arrives neuro intact.  Head CT is fortunately negative.  Carboxyhemoglobin level is low not suggestive of toxicity.  I had a lengthy discussion with the patient and her son at bedside regarding her symptoms and that they are likely related to anxiety.  I have encouraged her son to follow-up with primary  care to help get a referral to possible assisted living.  The patient and her son verbalized understanding agreement the plan.      ____________________________________________   FINAL CLINICAL IMPRESSION(S) / ED DIAGNOSES  Final diagnoses:  Anxiety  Nonintractable headache, unspecified chronicity pattern, unspecified headache type      NEW MEDICATIONS STARTED DURING THIS VISIT:  Discharge Medication List as of 08/03/2017  5:38 AM       Note:  This document was prepared using Dragon voice recognition software and may include unintentional dictation errors.     Darel Hong, MD 08/04/17 [phone]

## 2017-08-03 NOTE — ED Triage Notes (Signed)
Pt arrives via [facility] with c/o possible gas leak in house. Pt reported gas leak which was verified as negative by fire department. Fire department reports that no gas was on in the house. Pt denies symptoms or pain at this time in triage.

## 2017-08-03 NOTE — ED Notes (Signed)
Pt is c/o chest pressure at time of triage.

## 2017-08-03 NOTE — Discharge Instructions (Signed)
Fortunately today your blood work, your head CT, and your EKG were reassuring.  Please follow-up with your primary care physician within 2-3 days for reevaluation return to the emergency department sooner for any concerns.  It was a pleasure to take care of you today, and thank you for coming to our emergency department.  If you have any questions or concerns before leaving please ask the nurse to grab me and I'm more than happy to go through your aftercare instructions again.  If you were prescribed any opioid pain medication today such as Norco, Vicodin, Percocet, morphine, hydrocodone, or oxycodone please make sure you do not drive when you are taking this medication as it can alter your ability to drive safely.  If you have any concerns once you are home that you are not improving or are in fact getting worse before you can make it to your follow-up appointment, please do not hesitate to call 911 and come back for further evaluation.  Darel Hong, MD  Results for orders placed or performed during the hospital encounter of 08/03/17  Comprehensive metabolic panel  Result Value Ref Range   Sodium 140 135 - 145 mmol/L   Potassium 3.9 3.5 - 5.1 mmol/L   Chloride 106 101 - 111 mmol/L   CO2 28 22 - 32 mmol/L   Glucose, Bld 104 (H) 65 - 99 mg/dL   BUN 25 (H) 6 - 20 mg/dL   Creatinine, Ser 1.14 (H) 0.44 - 1.00 mg/dL   Calcium 9.2 8.9 - 10.3 mg/dL   Total Protein 6.9 6.5 - 8.1 g/dL   Albumin 3.5 3.5 - 5.0 g/dL   AST 22 15 - 41 U/L   ALT 16 14 - 54 U/L   Alkaline Phosphatase 63 38 - 126 U/L   Total Bilirubin 1.1 0.3 - 1.2 mg/dL   GFR calc non Af Amer 40 (L) >60 mL/min   GFR calc Af Amer 47 (L) >60 mL/min   Anion gap 6 5 - 15  CBC with Differential  Result Value Ref Range   WBC 8.0 3.6 - 11.0 K/uL   RBC 3.83 3.80 - 5.20 MIL/uL   Hemoglobin 11.9 (L) 12.0 - 16.0 g/dL   HCT 35.7 35.0 - 47.0 %   MCV 93.4 80.0 - 100.0 fL   MCH 31.1 26.0 - 34.0 pg   MCHC 33.3 32.0 - 36.0 g/dL   RDW 13.6  11.5 - 14.5 %   Platelets 140 (L) 150 - 440 K/uL   Neutrophils Relative % 70 %   Neutro Abs 5.6 1.4 - 6.5 K/uL   Lymphocytes Relative 12 %   Lymphs Abs 0.9 (L) 1.0 - 3.6 K/uL   Monocytes Relative 11 %   Monocytes Absolute 0.9 0.2 - 0.9 K/uL   Eosinophils Relative 6 %   Eosinophils Absolute 0.5 0 - 0.7 K/uL   Basophils Relative 1 %   Basophils Absolute 0.1 0 - 0.1 K/uL  Troponin I  Result Value Ref Range   Troponin I 0.03 (HH) <0.03 ng/mL  .Cooxemetry Panel (carboxy, met, total hgb, O2 sat)  Result Value Ref Range   O2 Saturation 95.2 %   Carboxyhemoglobin 2.2 (H) 0.5 - 1.5 %   Methemoglobin 1.5 0.0 - 1.5 %   Total oxygen content 92.3 mL/dL   Ct Head Wo Contrast  Result Date: 08/03/2017 CLINICAL DATA:  Initial evaluation for possible subarachnoid hemorrhage. EXAM: CT HEAD WITHOUT CONTRAST TECHNIQUE: Contiguous axial images were obtained from the base of the skull  through the vertex without intravenous contrast. COMPARISON:  Prior MRI from 05/15/2011. FINDINGS: Brain: Generalized age-related cerebral atrophy with mild chronic small vessel ischemic disease. No acute intracranial hemorrhage. No acute large vessel territory infarct. No mass lesion, midline shift or mass effect. No hydrocephalus. No extra-axial fluid collection. Vascular: No hyperdense vessel. Calcified atherosclerosis present at the skull base. Skull: Scalp soft tissues and calvarium within normal limits. Sinuses/Orbits: Globes normal soft tissues demonstrate no acute abnormality. Patient status post cataract extraction on the right. Paranasal sinuses and mastoid air cells are clear. Other: None. IMPRESSION: 1. No acute intracranial abnormality identified. 2. Mild chronic small vessel ischemic disease. Electronically Signed   By: Jeannine Boga M.D.   On: 08/03/2017 03:34   Dg Chest Portable 1 View  Result Date: 07/27/2017 CLINICAL DATA:  Patient feels unwell with pushing on the chest for 2 weeks. History of hypertension  and cardiac stents. Gastroesophageal reflux disease. Nonsmoker. EXAM: PORTABLE CHEST 1 VIEW COMPARISON:  07/01/2016 FINDINGS: Cardiac enlargement with mild vascular congestion. Mild interstitial pattern to the lungs is similar to prior study and likely represents fibrosis. Can't exclude early developing edema. Small left pleural effusion. No focal consolidation. No pneumothorax. Calcification of the aorta. Surgical clips in the right axilla. IMPRESSION: Cardiac enlargement with mild vascular congestion. No definite edema. Small left effusion. Probable scarring in the lungs. Aortic atherosclerosis. Electronically Signed   By: Lucienne Capers M.D.   On: 07/27/2017 04:46

## 2017-10-23 ENCOUNTER — Other Ambulatory Visit: Payer: Self-pay

## 2017-10-23 ENCOUNTER — Encounter: Payer: Self-pay | Admitting: Emergency Medicine

## 2017-10-23 ENCOUNTER — Observation Stay
Admission: EM | Admit: 2017-10-23 | Discharge: 2017-10-24 | Disposition: A | Discharge status: Home / Self Care | Payer: Medicare Other | Attending: Internal Medicine | Admitting: Internal Medicine

## 2017-10-23 ENCOUNTER — Emergency Department: Payer: Medicare Other

## 2017-10-23 DIAGNOSIS — J439 Emphysema, unspecified: Secondary | ICD-10-CM | POA: Insufficient documentation

## 2017-10-23 DIAGNOSIS — I129 Hypertensive chronic kidney disease with stage 1 through stage 4 chronic kidney disease, or unspecified chronic kidney disease: Secondary | ICD-10-CM | POA: Diagnosis not present

## 2017-10-23 DIAGNOSIS — E7800 Pure hypercholesterolemia, unspecified: Secondary | ICD-10-CM | POA: Diagnosis not present

## 2017-10-23 DIAGNOSIS — N183 Chronic kidney disease, stage 3 (moderate): Secondary | ICD-10-CM | POA: Diagnosis not present

## 2017-10-23 DIAGNOSIS — Z9841 Cataract extraction status, right eye: Secondary | ICD-10-CM | POA: Diagnosis not present

## 2017-10-23 DIAGNOSIS — I700 Atherosclerosis of aorta: Secondary | ICD-10-CM | POA: Insufficient documentation

## 2017-10-23 DIAGNOSIS — K219 Gastro-esophageal reflux disease without esophagitis: Secondary | ICD-10-CM | POA: Insufficient documentation

## 2017-10-23 DIAGNOSIS — I161 Hypertensive emergency: Secondary | ICD-10-CM | POA: Insufficient documentation

## 2017-10-23 DIAGNOSIS — Z79899 Other long term (current) drug therapy: Secondary | ICD-10-CM | POA: Diagnosis not present

## 2017-10-23 DIAGNOSIS — Z66 Do not resuscitate: Secondary | ICD-10-CM | POA: Insufficient documentation

## 2017-10-23 DIAGNOSIS — R079 Chest pain, unspecified: Secondary | ICD-10-CM | POA: Diagnosis present

## 2017-10-23 DIAGNOSIS — Z7902 Long term (current) use of antithrombotics/antiplatelets: Secondary | ICD-10-CM | POA: Diagnosis not present

## 2017-10-23 DIAGNOSIS — Z9889 Other specified postprocedural states: Secondary | ICD-10-CM | POA: Diagnosis not present

## 2017-10-23 DIAGNOSIS — I2510 Atherosclerotic heart disease of native coronary artery without angina pectoris: Secondary | ICD-10-CM | POA: Insufficient documentation

## 2017-10-23 DIAGNOSIS — Z902 Acquired absence of lung [part of]: Secondary | ICD-10-CM | POA: Diagnosis not present

## 2017-10-23 DIAGNOSIS — Z7982 Long term (current) use of aspirin: Secondary | ICD-10-CM | POA: Diagnosis not present

## 2017-10-23 DIAGNOSIS — R0789 Other chest pain: Secondary | ICD-10-CM | POA: Diagnosis not present

## 2017-10-23 DIAGNOSIS — Z853 Personal history of malignant neoplasm of breast: Secondary | ICD-10-CM | POA: Diagnosis not present

## 2017-10-23 DIAGNOSIS — Z9010 Acquired absence of unspecified breast and nipple: Secondary | ICD-10-CM | POA: Insufficient documentation

## 2017-10-23 DIAGNOSIS — K449 Diaphragmatic hernia without obstruction or gangrene: Secondary | ICD-10-CM | POA: Diagnosis not present

## 2017-10-23 DIAGNOSIS — M109 Gout, unspecified: Secondary | ICD-10-CM | POA: Insufficient documentation

## 2017-10-23 LAB — BASIC METABOLIC PANEL
ANION GAP: 6 (ref 5–15)
BUN: 31 mg/dL — ABNORMAL HIGH (ref 8–23)
CHLORIDE: 107 mmol/L (ref 98–111)
CO2: 26 mmol/L (ref 22–32)
CREATININE: 1.19 mg/dL — AB (ref 0.44–1.00)
Calcium: 9.1 mg/dL (ref 8.9–10.3)
GFR calc non Af Amer: 38 mL/min — ABNORMAL LOW (ref >60)
GFR, EST AFRICAN AMERICAN: 44 mL/min — AB (ref >60)
Glucose, Bld: 89 mg/dL (ref 70–99)
POTASSIUM: 4.5 mmol/L (ref 3.5–5.1)
SODIUM: 139 mmol/L (ref 135–145)

## 2017-10-23 LAB — TROPONIN I: Troponin I: <0.03 ng/mL (ref <0.03)

## 2017-10-23 LAB — CBC
HCT: 34.1 % — ABNORMAL LOW (ref 35.0–47.0)
HEMOGLOBIN: 11.6 g/dL — AB (ref 12.0–16.0)
MCH: 31.8 pg (ref 26.0–34.0)
MCHC: 34.1 g/dL (ref 32.0–36.0)
MCV: 93.3 fL (ref 80.0–100.0)
PLATELETS: 145 10*3/uL — AB (ref 150–440)
RBC: 3.66 MIL/uL — AB (ref 3.80–5.20)
RDW: 12.7 % (ref 11.5–14.5)
WBC: 6.8 10*3/uL (ref 3.6–11.0)

## 2017-10-23 LAB — HEMOGLOBIN A1C
Hgb A1c MFr Bld: 5.4 % (ref 4.8–5.6)
Mean Plasma Glucose: 108.28 mg/dL

## 2017-10-23 LAB — TSH: TSH: 1.116 u[IU]/mL (ref 0.350–4.500)

## 2017-10-23 LAB — GLUCOSE, CAPILLARY: Glucose-Capillary: 87 mg/dL (ref 70–99)

## 2017-10-23 MED ORDER — ASPIRIN EC 81 MG PO TBEC
81.0000 mg | DELAYED_RELEASE_TABLET | Freq: Every day | ORAL | Status: DC
  Administered 2017-10-23 – 2017-10-24 (×2): 81 mg via ORAL
  Filled 2017-10-23 (×2): qty 1

## 2017-10-23 MED ORDER — DOCUSATE SODIUM 100 MG PO CAPS
100.0000 mg | ORAL_CAPSULE | Freq: Two times a day (BID) | ORAL | Status: DC
  Administered 2017-10-23 – 2017-10-24 (×2): 100 mg via ORAL
  Filled 2017-10-23 (×3): qty 1

## 2017-10-23 MED ORDER — HEPARIN SODIUM (PORCINE) 5000 UNIT/ML IJ SOLN
5000.0000 [IU] | Freq: Three times a day (TID) | INTRAMUSCULAR | Status: DC
  Administered 2017-10-23 – 2017-10-24 (×4): 5000 [IU] via SUBCUTANEOUS
  Filled 2017-10-23 (×4): qty 1

## 2017-10-23 MED ORDER — ACETAMINOPHEN 650 MG RE SUPP: 650.0000 mg | Freq: Four times a day (QID) | RECTAL | Status: DC | PRN

## 2017-10-23 MED ORDER — ROSUVASTATIN CALCIUM 10 MG PO TABS
20.0000 mg | ORAL_TABLET | Freq: Every day | ORAL | Status: DC
  Administered 2017-10-23 – 2017-10-24 (×2): 20 mg via ORAL
  Filled 2017-10-23 (×2): qty 2

## 2017-10-23 MED ORDER — LOSARTAN POTASSIUM 50 MG PO TABS
100.0000 mg | ORAL_TABLET | Freq: Every day | ORAL | Status: DC
  Administered 2017-10-23 – 2017-10-24 (×2): 100 mg via ORAL
  Filled 2017-10-23 (×2): qty 2

## 2017-10-23 MED ORDER — SODIUM CHLORIDE 0.9% FLUSH
3.0000 mL | Freq: Two times a day (BID) | INTRAVENOUS | Status: DC
  Administered 2017-10-23 – 2017-10-24 (×2): 3 mL via INTRAVENOUS

## 2017-10-23 MED ORDER — HYDRALAZINE HCL 25 MG PO TABS
25.0000 mg | ORAL_TABLET | Freq: Two times a day (BID) | ORAL | Status: DC
  Administered 2017-10-23 – 2017-10-24 (×3): 25 mg via ORAL
  Filled 2017-10-23 (×3): qty 1

## 2017-10-23 MED ORDER — ONDANSETRON HCL 4 MG PO TABS
4.0000 mg | ORAL_TABLET | Freq: Four times a day (QID) | ORAL | Status: DC | PRN
Start: 2017-10-23 — End: 2017-10-24

## 2017-10-23 MED ORDER — CLOPIDOGREL BISULFATE 75 MG PO TABS
75.0000 mg | ORAL_TABLET | Freq: Every day | ORAL | Status: DC
  Administered 2017-10-23 – 2017-10-24 (×2): 75 mg via ORAL
  Filled 2017-10-23 (×2): qty 1

## 2017-10-23 MED ORDER — ACETAMINOPHEN 325 MG PO TABS: 650.0000 mg | ORAL_TABLET | Freq: Four times a day (QID) | ORAL | Status: DC | PRN

## 2017-10-23 MED ORDER — LABETALOL HCL 200 MG PO TABS
200.0000 mg | ORAL_TABLET | Freq: Two times a day (BID) | ORAL | Status: DC
  Administered 2017-10-23 – 2017-10-24 (×3): 200 mg via ORAL
  Filled 2017-10-23 (×4): qty 1

## 2017-10-23 MED ORDER — NITROGLYCERIN 0.4 MG SL SUBL
0.4000 mg | SUBLINGUAL_TABLET | SUBLINGUAL | Status: DC | PRN
Start: 2017-10-23 — End: 2017-10-24
  Administered 2017-10-23: 0.4 mg via SUBLINGUAL
  Filled 2017-10-23 (×2): qty 1

## 2017-10-23 MED ORDER — IOHEXOL 350 MG/ML SOLN
60.0000 mL | Freq: Once | INTRAVENOUS | Status: AC | PRN
  Administered 2017-10-23: 60 mL via INTRAVENOUS

## 2017-10-23 MED ORDER — ONDANSETRON HCL 4 MG/2ML IJ SOLN
4.0000 mg | Freq: Four times a day (QID) | INTRAMUSCULAR | Status: DC | PRN
  Administered 2017-10-24: 4 mg via INTRAVENOUS
  Filled 2017-10-23: qty 2

## 2017-10-23 NOTE — Consult Note (Signed)
CARDIOLOGY CONSULT NOTE       Patient ID: [name] [name] MRN: [id number] DOB/AGE: July 23, 1921 84 y.o.  Admit date: 10/23/2017 Referring Physician: Bridgett Larsson Primary Physician: Jodi Marble, MD Primary Cardiologist: None Reason for Consultation: Chest Pain  Active Problems:   Chest pain   HPI:  84 y.o. lives alone with history of breast cancer , HTN, HLD PMH indicates CAD but I have no records in Epic of seeing cardiology and she has not been [facility] at [facility]. She denies chest pain to me this am However she seems very forgetful and likely has some dementia. Primary complaint to me is feeling sick for 24 hours with nausea and fatigue She had some postural dizziness and still feels unsteady on her feet. No dyspnea palpitations or frank syncope. Was very HTN in ER. CT no PE/dissections CXR NAD ECG no acute changes and troponin negative   ROS All other systems reviewed and negative except as noted above  Past Medical History:  Diagnosis Date  . Cancer ([place])    breast  . Chronic kidney disease   . Coronary artery disease   . GERD (gastroesophageal reflux disease)   . Gout   . HH (hiatus hernia)   . Hypercholesteremia   . Hypertension   . Murmur   . Palpitations     No family history on file.  Social History   Socioeconomic History  . Marital status: Widowed    Spouse name: Not on file  . Number of children: Not on file  . Years of education: Not on file  . Highest education level: Not on file  Occupational History  . Not on file  Social Needs  . Financial resource strain: Not on file  . Food insecurity:    Worry: Not on file    Inability: Not on file  . Transportation needs:    Medical: Not on file    Non-medical: Not on file  Tobacco Use  . Smoking status: Never Smoker  . Smokeless tobacco: Never Used  Substance and Sexual Activity  . Alcohol use: No  . Drug use: No  . Sexual activity: Not Currently  Lifestyle  . Physical activity:    Days per week: Not on  file    Minutes per session: Not on file  . Stress: Not on file  Relationships  . Social connections:    Talks on phone: Not on file    Gets together: Not on file    Attends religious service: Not on file    Active member of club or organization: Not on file    Attends meetings of clubs or organizations: Not on file    Relationship status: Not on file  . Intimate partner violence:    Fear of current or ex partner: Not on file    Emotionally abused: Not on file    Physically abused: Not on file    Forced sexual activity: Not on file  Other Topics Concern  . Not on file  Social History Narrative  . Not on file    Past Surgical History:  Procedure Laterality Date  . APPENDECTOMY    . cardiac stents    . CATARACT EXTRACTION W/PHACO Right 09/15/2015   Procedure: CATARACT EXTRACTION PHACO AND INTRAOCULAR LENS PLACEMENT (IOC);  Surgeon: Estill Cotta, MD;  Location: [facility] ORS;  Service: Ophthalmology;  Laterality: Right;  [place] 00:57.6 AP% 23.9 CDE 25.53 fluid pack lot # [id number]  . cornary stent    . LOBECTOMY  lung  . MASTECTOMY       . aspirin EC  81 mg Oral Daily  . clopidogrel  75 mg Oral Daily  . docusate sodium  100 mg Oral BID  . heparin  5,000 Units Subcutaneous Q8H  . hydrALAZINE  25 mg Oral BID  . labetalol  200 mg Oral BID  . losartan  100 mg Oral Daily  . rosuvastatin  20 mg Oral Daily     Physical Exam: Blood pressure (!) 140/59, pulse 71, temperature 98.3 F (36.8 C), temperature source Oral, resp. rate 14, height 5\' 8"  (1.727 m), weight 147 lb 4.8 oz (66.8 kg), SpO2 98 %.    Forgetful  Elderly white female  HEENT: normal Neck supple with no adenopathy JVP normal no bruits no thyromegaly Lungs clear with no wheezing and good diaphragmatic motion Heart:  S1/S2 3/6 SEM  murmur, no rub, gallop or click PMI normal Abdomen: benighn, BS positve, no tenderness, no AAA no bruit.  No HSM or HJR Distal pulses intact with no bruits No edema Neuro  non-focal Skin warm and dry No muscular weakness  Labs:   Lab Results  Component Value Date   WBC 6.8 10/23/2017   HGB 11.6 (L) 10/23/2017   HCT 34.1 (L) 10/23/2017   MCV 93.3 10/23/2017   PLT 145 (L) 10/23/2017    Recent Labs  Lab 10/23/17 0019  NA 139  K 4.5  CL 107  CO2 26  BUN 31*  CREATININE 1.19*  CALCIUM 9.1  GLUCOSE 89   Lab Results  Component Value Date   CKTOTAL 76 04/30/2012   CKMB 1.3 04/30/2012   TROPONINI <0.03 10/23/2017    Lab Results  Component Value Date   CHOL 129 05/01/2012   Lab Results  Component Value Date   HDL 56 05/01/2012   Lab Results  Component Value Date   LDLCALC 53 05/01/2012   Lab Results  Component Value Date   TRIG 100 05/01/2012   No results found for: CHOLHDL No results found for: LDLDIRECT    Radiology: Ct Angio Chest Aorta W And/or Wo Contrast  Result Date: 10/23/2017 CLINICAL DATA:  Left-sided chest pain on and off all day. EXAM: CT ANGIOGRAPHY CHEST WITH CONTRAST TECHNIQUE: Multidetector CT imaging of the chest was performed using the standard protocol during bolus administration of intravenous contrast. Multiplanar CT image reconstructions and MIPs were obtained to evaluate the vascular anatomy. CONTRAST:  54mL OMNIPAQUE IOHEXOL 350 MG/ML SOLN COMPARISON:  01/02/2014 FINDINGS: Cardiovascular: Conventional branch pattern of the great vessels with atherosclerosis. Atherosclerotic nonaneurysmal thoracic aorta without dissection. Left main and three-vessel coronary arteriosclerosis is identified. The heart is top-normal in size without pericardial effusion. No acute pulmonary embolus to the proximal segmental level. Satisfactory pulmonary arterial opacification. Mediastinum/Nodes: No thyromegaly. Tiny cystic nodule in the left thyroid lobe measuring 5 mm. Calcified mediastinal and hilar lymph nodes compatible with old granulomatous disease. No adenopathy. Trachea and mainstem bronchi are patent. Esophagus is unremarkable and  nondilated. Lungs/Pleura: Chronic mild interstitial prominence and subpleural areas of scarring in the upper lobes. Bronchiectasis is identified to both lower lobes with atelectasis and/or scarring. Moderate-sized paraesophageal hernia is redemonstrated. No pneumothorax or pulmonary consolidations. No dominant mass. Upper Abdomen: Uncomplicated cholelithiasis. Partially included left renal simple cyst off the interpolar aspect measuring 1.7 cm in diameter. No acute abnormality. Colonic interposition is noted over the liver. Musculoskeletal: Thoracolumbar spondylosis. No aggressive osseous lesions. Review of the MIP images confirms the above findings. IMPRESSION: 1. No acute pulmonary  embolus. 2. Left main and three-vessel coronary arteriosclerosis. 3. Calcified mediastinal and hilar lymph nodes compatible with old granulomatous disease. 4. Nonaneurysmal atherosclerotic aorta. 5. Moderate paraesophageal hernia. 6. Chronic interstitial disease scarring bilaterally. Aortic Atherosclerosis (ICD10-I70.0) and Emphysema (ICD10-J43.9). Electronically Signed   By: Ashley Royalty M.D.   On: 10/23/2017 02:03    EKG: NSR no acute changes normal    ASSESSMENT AND PLAN:   Chest Pain: intermittent complaint to ER none now r/o no acute ECG changes as expected for age CT showed LM and 3 vessel coronary calcification Not clear that she needs further ischemic testing given dementia and advanced age with atypical symptoms. Will tentatively schedule lexiscan myovue for am   Murmur:  History of likley AV sclerosis will check echo which will also help with w/u chest pain  HTN:  Improved continue current meds including hydralazine and labetalol   HLD:  On statin   Signed: Jenkins Rouge 10/23/2017, 8:45 AM

## 2017-10-23 NOTE — ED Notes (Signed)
EMS also reports that pt was give 324 mg aspirin and nitro x 1 en route to facility.

## 2017-10-23 NOTE — Progress Notes (Signed)
Son Timmothy Sours [facility]) contacted regarding verbal consent for stress test ordered for 7/1. Son stated that patient's primary cardiologist is Dr. Humphrey Rolls and patient just saw him recently. Son also stated that patient has had chest pain for years and it is nothing new. Son requested Dr. Humphrey Rolls be called for further consultation and for recommendations.   Dr. Humphrey Rolls contacted and notified of consult. Dr. Humphrey Rolls to see patient on morning of 7/1 and verbal order given to cancel stress test.

## 2017-10-23 NOTE — ED Provider Notes (Signed)
[facility] Emergency Department Provider Note   First MD Initiated Contact with Patient 10/23/17 0016     (approximate)  I have reviewed the triage vital signs and the nursing notes.   HISTORY  Chief Complaint Chest Pain    HPI [name] [name] is a 84 y.o. female below list of chronic medical conditions of breast CA hypertension and CAD presents to the emergency department with acute onset of left-sided nonradiating chest pain which has been occurring on and off during the course of today.  Patient also admits to to near syncopal episodes.  Patient states one episode she attempted to bend over to pick something up and "almost passed out".  The second episode was non-positional patient states that she felt like she was about to pass out.  Patient states current pain score is 5 out of 10 and describes it as pressure in the left chest.  Patient denies any shortness of breath no lower extremity pain or swelling.  On arrival to the emergency department patient noted to be markedly hypertensive with a blood pressure of 204/66.   Past Medical History:  Diagnosis Date  . Cancer ([place])    breast  . Chronic kidney disease   . Coronary artery disease   . GERD (gastroesophageal reflux disease)   . Gout   . HH (hiatus hernia)   . Hypercholesteremia   . Hypertension   . Murmur   . Palpitations     Patient Active Problem List   Diagnosis Date Noted  . Chest pain 10/23/2017    Past Surgical History:  Procedure Laterality Date  . APPENDECTOMY    . cardiac stents    . CATARACT EXTRACTION W/PHACO Right 09/15/2015   Procedure: CATARACT EXTRACTION PHACO AND INTRAOCULAR LENS PLACEMENT (IOC);  Surgeon: Estill Cotta, MD;  Location: [facility] ORS;  Service: Ophthalmology;  Laterality: Right;  [place] 00:57.6 AP% 23.9 CDE 25.53 fluid pack lot # [id number]  . cornary stent    . LOBECTOMY     lung  . MASTECTOMY      Prior to Admission medications   Medication Sig Start  Date End Date Taking? Authorizing Provider  clopidogrel (PLAVIX) 75 MG tablet Take 75 mg by mouth daily.   Yes [provider]  hydrALAZINE (APRESOLINE) 25 MG tablet Take 1 tablet by mouth 2 (two) times daily.   Yes [provider]  labetalol (NORMODYNE) 200 MG tablet Take 1 tablet by mouth 2 (two) times daily.   Yes [provider]  losartan (COZAAR) 100 MG tablet Take 1 tablet by mouth daily. 06/08/17  Yes [provider]  rosuvastatin (CRESTOR) 20 MG tablet Take 20 mg by mouth daily.    Yes [provider]    Allergies No known drug allergies  No family history on file.  Social History Social History   Tobacco Use  . Smoking status: Never Smoker  . Smokeless tobacco: Never Used  Substance Use Topics  . Alcohol use: No  . Drug use: No    Review of Systems Constitutional: No fever/chills Eyes: No visual changes. ENT: No sore throat. Cardiovascular: Denies chest pain. Respiratory: Denies shortness of breath. Gastrointestinal: No abdominal pain.  No nausea, no vomiting.  No diarrhea.  No constipation. Genitourinary: Negative for dysuria. Musculoskeletal: Negative for neck pain.  Negative for back pain. Integumentary: Negative for rash. Neurological: Negative for headaches, focal weakness or numbness.   ____________________________________________   PHYSICAL EXAM:  VITAL SIGNS: ED Triage Vitals  Enc  Vitals Group     BP 10/23/17 0014 (!) 204/66     Pulse Rate 10/23/17 0014 60     Resp 10/23/17 0014 12     Temp 10/23/17 0014 97.7 F (36.5 C)     Temp Source 10/23/17 0014 Oral     SpO2 10/23/17 0014 97 %     Weight 10/23/17 0010 77.1 kg (170 lb)     Height 10/23/17 0010 1.676 m (5\' 6" )     Head Circumference --      Peak Flow --      Pain Score 10/23/17 0010 7     Pain Loc --      Pain Edu? --      Excl. in [place]? --     Constitutional: Alert and oriented. Well appearing and in no acute distress. Eyes: Conjunctivae are  normal.  Head: Atraumatic. Mouth/Throat: Mucous membranes are moist. Oropharynx non-erythematous. Neck: No stridor.   Cardiovascular: Normal rate, regular rhythm. Good peripheral circulation. Grossly normal heart sounds. Respiratory: Normal respiratory effort.  No retractions. Lungs CTAB. Gastrointestinal: Soft and nontender. No distention.  Musculoskeletal: No lower extremity tenderness nor edema. No gross deformities of extremities. Neurologic:  Normal speech and language. No gross focal neurologic deficits are appreciated.  Skin:  Skin is warm, dry and intact. No rash noted. Psychiatric: Mood and affect are normal. Speech and behavior are normal.  ____________________________________________   LABS (all labs ordered are listed, but only abnormal results are displayed)  Labs Reviewed  BASIC METABOLIC PANEL - Abnormal; Notable for the following components:      Result Value   BUN 31 (*)    Creatinine, Ser 1.19 (*)    GFR calc non Af Amer 38 (*)    GFR calc Af Amer 44 (*)    All other components within normal limits  CBC - Abnormal; Notable for the following components:   RBC 3.66 (*)    Hemoglobin 11.6 (*)    HCT 34.1 (*)    Platelets 145 (*)    All other components within normal limits  TROPONIN I   ____________________________________________  EKG  ED ECG REPORT I, [place] N BROWN, the attending physician, personally viewed and interpreted this ECG.   Date: 10/23/2017  EKG Time: 12:48 AM  Rate: 63  Rhythm: Normal sinus rhythm  Axis: Normal  Intervals: Normal  ST&T Change: None  ____________________________________________  RADIOLOGY I, [place] N BROWN, personally viewed and evaluated these images (plain radiographs) as part of my medical decision making, as well as reviewing the written report by the radiologist.      Official radiology report(s): Ct Angio Chest Aorta W And/or Wo Contrast  Result Date: 10/23/2017 CLINICAL DATA:  Left-sided chest pain on  and off all day. EXAM: CT ANGIOGRAPHY CHEST WITH CONTRAST TECHNIQUE: Multidetector CT imaging of the chest was performed using the standard protocol during bolus administration of intravenous contrast. Multiplanar CT image reconstructions and MIPs were obtained to evaluate the vascular anatomy. CONTRAST:  73mL OMNIPAQUE IOHEXOL 350 MG/ML SOLN COMPARISON:  01/02/2014 FINDINGS: Cardiovascular: Conventional branch pattern of the great vessels with atherosclerosis. Atherosclerotic nonaneurysmal thoracic aorta without dissection. Left main and three-vessel coronary arteriosclerosis is identified. The heart is top-normal in size without pericardial effusion. No acute pulmonary embolus to the proximal segmental level. Satisfactory pulmonary arterial opacification. Mediastinum/Nodes: No thyromegaly. Tiny cystic nodule in the left thyroid lobe measuring 5 mm. Calcified mediastinal and hilar lymph nodes compatible with old granulomatous disease. No adenopathy. Trachea and mainstem  bronchi are patent. Esophagus is unremarkable and nondilated. Lungs/Pleura: Chronic mild interstitial prominence and subpleural areas of scarring in the upper lobes. Bronchiectasis is identified to both lower lobes with atelectasis and/or scarring. Moderate-sized paraesophageal hernia is redemonstrated. No pneumothorax or pulmonary consolidations. No dominant mass. Upper Abdomen: Uncomplicated cholelithiasis. Partially included left renal simple cyst off the interpolar aspect measuring 1.7 cm in diameter. No acute abnormality. Colonic interposition is noted over the liver. Musculoskeletal: Thoracolumbar spondylosis. No aggressive osseous lesions. Review of the MIP images confirms the above findings. IMPRESSION: 1. No acute pulmonary embolus. 2. Left main and three-vessel coronary arteriosclerosis. 3. Calcified mediastinal and hilar lymph nodes compatible with old granulomatous disease. 4. Nonaneurysmal atherosclerotic aorta. 5. Moderate  paraesophageal hernia. 6. Chronic interstitial disease scarring bilaterally. Aortic Atherosclerosis (ICD10-I70.0) and Emphysema (ICD10-J43.9). Electronically Signed   By: Ashley Royalty M.D.   On: 10/23/2017 02:03    __________________ Procedures   ____________________________________________   INITIAL IMPRESSION / ASSESSMENT AND PLAN / ED COURSE  As part of my medical decision making, I reviewed the following data within the electronic MEDICAL RECORD NUMBER   84 year old female presenting with above-stated history and physical exam secondary to chest pain.  Concern for ACS versus aortic aneurysm/dissection.  Initial EKG revealed no evidence of ischemia or infarction.  Amatory data unremarkable including troponin x1.  CT scan of the chest was performed which revealed no evidence of aortic aneurysm or dissection.  No PE noted as well.    ____________________________________________  FINAL CLINICAL IMPRESSION(S) / ED DIAGNOSES  Final diagnoses:  Chest pain, unspecified type     MEDICATIONS GIVEN DURING THIS VISIT:  Medications  iohexol (OMNIPAQUE) 350 MG/ML injection 60 mL (60 mLs Intravenous Contrast Given 10/23/17 0131)     ED Discharge Orders    None       Note:  This document was prepared using Dragon voice recognition software and may include unintentional dictation errors.    Gregor Hams, MD 10/23/17 [phone]

## 2017-10-23 NOTE — Plan of Care (Signed)
  Problem: Education: Goal: Knowledge of General Education information will improve Outcome: Progressing   Problem: Safety: Goal: Ability to remain free from injury will improve Outcome: Progressing   

## 2017-10-23 NOTE — Care Management Note (Signed)
Case Management Note  Patient Details  Name: [name] [name] MRN: [id number] Date of Birth: 04-28-1921  Subjective/Objective:   Patient admitted to [facility] under observation status for chest pain. RNCM consulted on patient to provide MOON letter and complete assessment. Patient currently lives alone and is able to complete all activities of daily living independently. Has a walker at home but does not use it. Son [name] [name] [phone] who is P.O.A is primary caregiver and provides transport for patient. PCP is Dr Elijio Miles. Uses [facility] and is able to obtain medications without issue.                   Action/Plan: RNCM to continue to follow for any needs.   Expected Discharge Date:                  Expected Discharge Plan:     In-House Referral:     Discharge planning Services     Post Acute Care Choice:    Choice offered to:     DME Arranged:    DME Agency:     HH Arranged:    HH Agency:     Status of Service:     If discussed at [employer], dates discussed:    Additional Comments:  Latanya Maudlin, RN 10/23/2017, 9:17 AM

## 2017-10-23 NOTE — ED Notes (Signed)
Patient reports dizziness/near syncope has returned. MD informed.

## 2017-10-23 NOTE — ED Notes (Signed)
Patient c/o left chest heaviness and pressure. Patient reports 2 near syncopal episodes today.

## 2017-10-23 NOTE — ED Notes (Signed)
ED Provider at bedside. 

## 2017-10-23 NOTE — ED Triage Notes (Signed)
Pt arrives via [facility] with c/o left sided chest pain which has been on and off all day. Per EMS, pt has had some syncopal episodes today. Pt is a/o x 4 at this time and denies any new medications.

## 2017-10-23 NOTE — Progress Notes (Signed)
Patient was see by [facility] by mistake and nurse called as family wanted [place] to see in AM . Advise cancel stress test as r/o MI and is DNR. Has LAD stent in past and may optmise meds.

## 2017-10-23 NOTE — ED Notes (Signed)
Patient transported to CT 

## 2017-10-23 NOTE — ED Notes (Signed)
Patient returned from CT

## 2017-10-23 NOTE — H&P (Signed)
[name] [name] is an 84 y.o. female.   Chief Complaint: Chest pain HPI: Patient with past medical history of coronary artery disease, hypertension, hypercholesterolemia and chronic kidney disease presents emergency department complaining of chest pain.  The pain is located behind her breasts bilaterally.  She admits to occasional shortness of breath, as well as to lightheaded spells today. The patient reports that her chest pain was partially relieved after taking aspirin and nitroglycerin in route.  Upon arrival, the patient's blood pressure was greater than 299 systolic.  Troponin has been negative so far and the patient does not have EKG changes, however due to her advanced age and cardia vascular risk factors emergency department staff, hospitalist service for admission.  Past Medical History:  Diagnosis Date  . Cancer ([place])    breast  . Chronic kidney disease   . Coronary artery disease   . GERD (gastroesophageal reflux disease)   . Gout   . HH (hiatus hernia)   . Hypercholesteremia   . Hypertension   . Murmur   . Palpitations     Past Surgical History:  Procedure Laterality Date  . APPENDECTOMY    . cardiac stents    . CATARACT EXTRACTION W/PHACO Right 09/15/2015   Procedure: CATARACT EXTRACTION PHACO AND INTRAOCULAR LENS PLACEMENT (IOC);  Surgeon: Estill Cotta, MD;  Location: [facility] ORS;  Service: Ophthalmology;  Laterality: Right;  [place] 00:57.6 AP% 23.9 CDE 25.53 fluid pack lot # [id number]  . cornary stent    . LOBECTOMY     lung  . MASTECTOMY      No family history on file.  The patient is unaware of any chronic medical illnesses of her immediate family all of whom lived into old age.  Social History:  reports that she has never smoked. She has never used smokeless tobacco. She reports that she does not drink alcohol or use drugs.  Allergies: No Known Allergies  Medications Prior to Admission  Medication Sig Dispense Refill  . clopidogrel (PLAVIX) 75 MG tablet Take  75 mg by mouth daily.    . hydrALAZINE (APRESOLINE) 25 MG tablet Take 1 tablet by mouth 2 (two) times daily.    Marland Kitchen labetalol (NORMODYNE) 200 MG tablet Take 1 tablet by mouth 2 (two) times daily.    Marland Kitchen losartan (COZAAR) 100 MG tablet Take 1 tablet by mouth daily.  0  . rosuvastatin (CRESTOR) 20 MG tablet Take 20 mg by mouth daily.       Results for orders placed or performed during the hospital encounter of 10/23/17 (from the past 48 hour(s))  Basic metabolic panel     Status: Abnormal   Collection Time: 10/23/17 12:19 AM  Result Value Ref Range   Sodium 139 135 - 145 mmol/L   Potassium 4.5 3.5 - 5.1 mmol/L   Chloride 107 98 - 111 mmol/L    Comment: Please note change in reference range.   CO2 26 22 - 32 mmol/L   Glucose, Bld 89 70 - 99 mg/dL    Comment: Please note change in reference range.   BUN 31 (H) 8 - 23 mg/dL    Comment: Please note change in reference range.   Creatinine, Ser 1.19 (H) 0.44 - 1.00 mg/dL   Calcium 9.1 8.9 - 10.3 mg/dL   GFR calc non Af Amer 38 (L) >60 mL/min   GFR calc Af Amer 44 (L) >60 mL/min    Comment: (NOTE) The eGFR has been calculated using the CKD EPI equation. This calculation  has not been validated in all clinical situations. eGFR's persistently <60 mL/min signify possible Chronic Kidney Disease.    Anion gap 6 5 - 15    Comment: Performed at [facility], [address] [place], [place] [address]  CBC     Status: Abnormal   Collection Time: 10/23/17 12:19 AM  Result Value Ref Range   WBC 6.8 3.6 - 11.0 K/uL   RBC 3.66 (L) 3.80 - 5.20 MIL/uL   Hemoglobin 11.6 (L) 12.0 - 16.0 g/dL   HCT 34.1 (L) 35.0 - 47.0 %   MCV 93.3 80.0 - 100.0 fL   MCH 31.8 26.0 - 34.0 pg   MCHC 34.1 32.0 - 36.0 g/dL   RDW 12.7 11.5 - 14.5 %   Platelets 145 (L) 150 - 440 K/uL    Comment: Performed at [facility], [address], [place], [place] [address]  Troponin I     Status: None   Collection Time: 10/23/17 12:19 AM  Result Value Ref Range    Troponin I <0.03 <0.03 ng/mL    Comment: Performed at [facility], [address] [place], [place] [address]  TSH     Status: None   Collection Time: 10/23/17 12:19 AM  Result Value Ref Range   TSH 1.116 0.350 - 4.500 uIU/mL    Comment: Performed by a 3rd Generation assay with a functional sensitivity of <=0.01 uIU/mL. Performed at [facility], [place], [place] [address]   Glucose, capillary     Status: None   Collection Time: 10/23/17  3:52 AM  Result Value Ref Range   Glucose-Capillary 87 70 - 99 mg/dL   Ct Angio Chest Aorta W And/or Wo Contrast  Result Date: 10/23/2017 CLINICAL DATA:  Left-sided chest pain on and off all day. EXAM: CT ANGIOGRAPHY CHEST WITH CONTRAST TECHNIQUE: Multidetector CT imaging of the chest was performed using the standard protocol during bolus administration of intravenous contrast. Multiplanar CT image reconstructions and MIPs were obtained to evaluate the vascular anatomy. CONTRAST:  55m OMNIPAQUE IOHEXOL 350 MG/ML SOLN COMPARISON:  01/02/2014 FINDINGS: Cardiovascular: Conventional branch pattern of the great vessels with atherosclerosis. Atherosclerotic nonaneurysmal thoracic aorta without dissection. Left main and three-vessel coronary arteriosclerosis is identified. The heart is top-normal in size without pericardial effusion. No acute pulmonary embolus to the proximal segmental level. Satisfactory pulmonary arterial opacification. Mediastinum/Nodes: No thyromegaly. Tiny cystic nodule in the left thyroid lobe measuring 5 mm. Calcified mediastinal and hilar lymph nodes compatible with old granulomatous disease. No adenopathy. Trachea and mainstem bronchi are patent. Esophagus is unremarkable and nondilated. Lungs/Pleura: Chronic mild interstitial prominence and subpleural areas of scarring in the upper lobes. Bronchiectasis is identified to both lower lobes with atelectasis and/or scarring. Moderate-sized paraesophageal hernia  is redemonstrated. No pneumothorax or pulmonary consolidations. No dominant mass. Upper Abdomen: Uncomplicated cholelithiasis. Partially included left renal simple cyst off the interpolar aspect measuring 1.7 cm in diameter. No acute abnormality. Colonic interposition is noted over the liver. Musculoskeletal: Thoracolumbar spondylosis. No aggressive osseous lesions. Review of the MIP images confirms the above findings. IMPRESSION: 1. No acute pulmonary embolus. 2. Left main and three-vessel coronary arteriosclerosis. 3. Calcified mediastinal and hilar lymph nodes compatible with old granulomatous disease. 4. Nonaneurysmal atherosclerotic aorta. 5. Moderate paraesophageal hernia. 6. Chronic interstitial disease scarring bilaterally. Aortic Atherosclerosis (ICD10-I70.0) and Emphysema (ICD10-J43.9). Electronically Signed   By: DAshley RoyaltyM.D.   On: 10/23/2017 02:03    Review of Systems  Constitutional: Negative for chills  and fever.  HENT: Negative for sore throat and tinnitus.   Eyes: Negative for blurred vision and redness.  Respiratory: Negative for cough and shortness of breath.   Cardiovascular: Positive for chest pain. Negative for palpitations, orthopnea and PND.  Gastrointestinal: Negative for abdominal pain, diarrhea, nausea and vomiting.  Genitourinary: Negative for dysuria, frequency and urgency.  Musculoskeletal: Negative for joint pain and myalgias.  Skin: Negative for rash.       No lesions  Neurological: Negative for speech change, focal weakness and weakness.  Endo/Heme/Allergies: Does not bruise/bleed easily.       No temperature intolerance  Psychiatric/Behavioral: Negative for depression and suicidal ideas.    Blood pressure (!) 140/59, pulse 71, temperature 98.3 F (36.8 C), temperature source Oral, resp. rate 14, height _0  (1.727 m), weight 66.8 kg (147 lb 4.8 oz), SpO2 98 %. Physical Exam  Vitals reviewed. Constitutional: She is oriented to person, place, and time. She  appears well-developed and well-nourished. No distress.  HENT:  Head: Normocephalic and atraumatic.  Mouth/Throat: Oropharynx is clear and moist.  Eyes: Pupils are equal, round, and reactive to light. Conjunctivae and EOM are normal. No scleral icterus.  Neck: Normal range of motion. Neck supple. No JVD present. No tracheal deviation present. No thyromegaly present.  Cardiovascular: Normal rate, regular rhythm and normal heart sounds. Exam reveals no gallop and no friction rub.  No murmur heard. Respiratory: Effort normal and breath sounds normal.  GI: Soft. Bowel sounds are normal. She exhibits no distension. There is no tenderness.  Genitourinary:  Genitourinary Comments: Deferred  Musculoskeletal: Normal range of motion. She exhibits no edema.  Lymphadenopathy:    She has no cervical adenopathy.  Neurological: She is alert and oriented to person, place, and time. No cranial nerve deficit. She exhibits normal muscle tone.  Skin: Skin is warm and dry. No rash noted. No erythema.  Psychiatric: She has a normal mood and affect. Her behavior is normal. Judgment and thought content normal.     Assessment/Plan This is a 83 year old female admitted for chest pain. 1.  Chest pain: Secondary to uncontrolled blood pressure.  Now that the patient's pressure has improved her chest pain has as well although she continues to feel some vague discomfort.  I gave her a nitroglycerin after arrival to the floor that still has not eased her current level of pain that she rates at 4 out of 10.  Continue to follow cardiac biomarkers.  Monitor telemetry.  Consult cardiology. 2.  CAD: Continue aspirin and Plavix 3.  Hypertension: Currently controlled; continue hydralazine, labetalol and losartan.  Nitroglycerin as needed.  IV morphine for severe pain. 4.  CKD: Stage III; stable.  Avoid nephrotoxic agents. 5.  Hyper lipidemia: Continue statin therapy 6.  DVT prophylaxis: Heparin 7.  GI prophylaxis: None The  patient is a full code.  Time spent on admission orders and patient care approximately 45 minutes  Harrie Foreman, MD 10/23/2017, 5:11 AM

## 2017-10-23 NOTE — Progress Notes (Addendum)
Pt states that she has some chest tightness at 0436. Pt was given nitro tablet 0.4 mg by mouth at 0437. After 5 minutes pt states that pain still at 7, but refused to take the second tablet of nitro.. Doctor Marcille Blanco was notified. Now new order was placed.  Will continue to monitor.

## 2017-10-23 NOTE — Care Management Obs Status (Signed)
[facility] NOTIFICATION   Patient Details  Name: [name] [name] MRN: [id number] Date of Birth: [birth year]   Medicare Observation Status Notification Given:  Yes    Humaira Sculley A Gerasimos Plotts, RN 10/23/2017, 9:02 AM

## 2017-10-23 NOTE — Progress Notes (Signed)
Advanced Care Plan.  Purpose of Encounter: CODE STATUS. Parties in Attendance: The patient and me. Patient's Decisional Capacity: Yes. Medical Story: The patient is a 84 years old Caucasian female with medical history of CAD, hypertension, hyperlipidemia, CKD and breast cancer.  She is admitted for chest pain and hypertension emergency.  I discussed with the patient about CODE STATUS. She said she want DNR.  I discussed with RN and changed from full code to DNR. Plan:  Code Status: DNR Time spent discussing advance care planning: 17 minutes.

## 2017-10-23 NOTE — Progress Notes (Addendum)
Patient has no complaints of chest pain. Blood pressure is better but still elevated at 170/64. Physical examinations is unremarkable. A/P: 1.  Chest pain: Secondary to uncontrolled blood pressure.   Follow-up troponin, lexiscan myovue for am per Dr. Johnsie Cancel. 2.  CAD: Continue aspirin and Plavix 3.   Hypertension emergency: Better controlled; continue hydralazine, labetalol and losartan.  Nitroglycerin as needed.  IV morphine for severe pain. 4.  CKD: Stage III; stable.  Avoid nephrotoxic agents. 5.  Hyper lipidemia: Continue statin therapy  Discussed with patient and nurse.  Time spent about 20 minutes.

## 2017-10-24 ENCOUNTER — Observation Stay: Admit: 2017-10-24 | Payer: Medicare Other

## 2017-10-24 DIAGNOSIS — R0789 Other chest pain: Secondary | ICD-10-CM | POA: Diagnosis not present

## 2017-10-24 MED ORDER — ASPIRIN 81 MG PO TABS: 81.0000 mg | ORAL_TABLET | Freq: Every day | ORAL | 1 refills | Status: DC

## 2017-10-24 NOTE — Discharge Summary (Signed)
[facility] NAME: [name] [name]    MR#:  [id number]  DATE OF BIRTH:  1922/01/23  DATE OF ADMISSION:  10/23/2017   ADMITTING PHYSICIAN: Harrie Foreman, MD  DATE OF DISCHARGE: 10/24/2017 PRIMARY CARE PHYSICIAN: Jodi Marble, MD   ADMISSION DIAGNOSIS:  Chest pain, unspecified type [R07.9] DISCHARGE DIAGNOSIS:  Active Problems:   Chest pain  SECONDARY DIAGNOSIS:   Past Medical History:  Diagnosis Date  . Cancer ([place])    breast  . Chronic kidney disease   . Coronary artery disease   . GERD (gastroesophageal reflux disease)   . Gout   . HH (hiatus hernia)   . Hypercholesteremia   . Hypertension   . Murmur   . Palpitations    HOSPITAL COURSE:  Patient has chest pain under left breast, exacerbated by movement. Physical examinations is unremarkable. 1. Atypical chest pain. Normal troponin, follow up Dr. Humphrey Rolls tomorrow per Dr. Humphrey Rolls. 2. CAD: Continue aspirin and Plavix 3.  Hypertension emergency: Better controlled; continue hydralazine, labetalol and losartan. Nitroglycerin as needed. IV morphine for severe pain. 4. CKD: Stage III; stable. Avoid nephrotoxic agents. 5. Hyper lipidemia: Continue statin therapy  DISCHARGE CONDITIONS:  Stable, discharge to home today. CONSULTS OBTAINED:  Treatment Team:  Josue Hector, MD Dionisio David, MD DRUG ALLERGIES:  No Known Allergies DISCHARGE MEDICATIONS:   Allergies as of 10/24/2017   No Known Allergies     Medication List    TAKE these medications   aspirin 81 MG tablet Take 1 tablet (81 mg total) by mouth daily.   clopidogrel 75 MG tablet Commonly known as:  PLAVIX Take 75 mg by mouth daily.   hydrALAZINE 25 MG tablet Commonly known as:  APRESOLINE Take 1 tablet by mouth 2 (two) times daily.   labetalol 200 MG tablet Commonly known as:  NORMODYNE Take 1 tablet by mouth 2 (two) times daily.   losartan 100 MG tablet Commonly known as:   COZAAR Take 1 tablet by mouth daily.   rosuvastatin 20 MG tablet Commonly known as:  CRESTOR Take 20 mg by mouth daily.        DISCHARGE INSTRUCTIONS:  See AVS.  If you experience worsening of your admission symptoms, develop shortness of breath, life threatening emergency, suicidal or homicidal thoughts you must seek medical attention immediately by calling 911 or calling your MD immediately  if symptoms less severe.  You Must read complete instructions/literature along with all the possible adverse reactions/side effects for all the Medicines you take and that have been prescribed to you. Take any new Medicines after you have completely understood and accpet all the possible adverse reactions/side effects.   Please note  You were cared for by a hospitalist during your hospital stay. If you have any questions about your discharge medications or the care you received while you were in the hospital after you are discharged, you can call the unit and asked to speak with the hospitalist on call if the hospitalist that took care of you is not available. Once you are discharged, your primary care physician will handle any further medical issues. Please note that NO REFILLS for any discharge medications will be authorized once you are discharged, as it is imperative that you return to your primary care physician (or establish a relationship with a primary care physician if you do not have one) for your aftercare needs so that they can reassess your need for medications and  monitor your lab values.    On the day of Discharge:  VITAL SIGNS:  Blood pressure (!) 147/63, pulse 60, temperature 98.4 F (36.9 C), temperature source Oral, resp. rate 18, height 5\' 8"  (1.727 m), weight 146 lb 8 oz (66.5 kg), SpO2 95 %. PHYSICAL EXAMINATION:  GENERAL:  84 y.o.-year-old patient lying in the bed with no acute distress.  EYES: Pupils equal, round, reactive to light and accommodation. No scleral icterus.  Extraocular muscles intact.  HEENT: Head atraumatic, normocephalic. Oropharynx and nasopharynx clear.  NECK:  Supple, no jugular venous distention. No thyroid enlargement, no tenderness.  LUNGS: Normal breath sounds bilaterally, no wheezing, rales,rhonchi or crepitation. No use of accessory muscles of respiration.  CARDIOVASCULAR: S1, S2 normal. No murmurs, rubs, or gallops.  ABDOMEN: Soft, non-tender, non-distended. Bowel sounds present. No organomegaly or mass.  EXTREMITIES: No pedal edema, cyanosis, or clubbing.  NEUROLOGIC: Cranial nerves II through XII are intact. Muscle strength 5/5 in all extremities. Sensation intact. Gait not checked.  PSYCHIATRIC: The patient is alert and oriented x 3.  SKIN: No obvious rash, lesion, or ulcer.  DATA REVIEW:   CBC Recent Labs  Lab 10/23/17 0019  WBC 6.8  HGB 11.6*  HCT 34.1*  PLT 145*    Chemistries  Recent Labs  Lab 10/23/17 0019  NA 139  K 4.5  CL 107  CO2 26  GLUCOSE 89  BUN 31*  CREATININE 1.19*  CALCIUM 9.1     Microbiology Results  No results found for this or any previous visit.  RADIOLOGY:  No results found.   Management plans discussed with the patient, family and they are in agreement.  CODE STATUS: DNR   TOTAL TIME TAKING CARE OF THIS PATIENT: 20 minutes.    Demetrios Loll M.D on 10/24/2017 at 8:39 AM  Between 7am to 6pm - Pager - [phone]  After 6pm go to [email or web address] - Proofreader  [facility]  Office  [phone]  CC: Primary care physician; Jodi Marble, MD   Note: This dictation was prepared with Dragon dictation along with smaller phrase technology. Any transcriptional errors that result from this process are unintentional.

## 2017-10-24 NOTE — Progress Notes (Signed)
Patient seen by cardiologist today, plan for discharge with follow up appointment set up with Dr.Khan. Patient given discharge instructions. Patient verbalized understanding with no further questions. Son [name] [name] called and voicemail left to inform patient being discharged today. Son [name] [name] will be taking patient home at 25. Volunteer services to wheel patient down to car.

## 2017-10-24 NOTE — Consult Note (Signed)
[name] [name] is a 84 y.o. female  [id number]  Primary Cardiologist: Dr. Neoma Laming Reason for Consultation: Chest pain  HPI: 84yo female with a pst medical history of HTN, HLP, CAD, GERD, CKD presented to ER with chest pain. Troponin was mildly elevated at 0.03, but is now below 0.03. No EKG changes. She is feeling better today.   Review of Systems: No chest pain. Feeling well.    Past Medical History:  Diagnosis Date  . Cancer ([place])    breast  . Chronic kidney disease   . Coronary artery disease   . GERD (gastroesophageal reflux disease)   . Gout   . HH (hiatus hernia)   . Hypercholesteremia   . Hypertension   . Murmur   . Palpitations     Medications Prior to Admission  Medication Sig Dispense Refill  . clopidogrel (PLAVIX) 75 MG tablet Take 75 mg by mouth daily.    . hydrALAZINE (APRESOLINE) 25 MG tablet Take 1 tablet by mouth 2 (two) times daily.    Marland Kitchen labetalol (NORMODYNE) 200 MG tablet Take 1 tablet by mouth 2 (two) times daily.    Marland Kitchen losartan (COZAAR) 100 MG tablet Take 1 tablet by mouth daily.  0  . rosuvastatin (CRESTOR) 20 MG tablet Take 20 mg by mouth daily.        Marland Kitchen aspirin EC  81 mg Oral Daily  . clopidogrel  75 mg Oral Daily  . docusate sodium  100 mg Oral BID  . heparin  5,000 Units Subcutaneous Q8H  . hydrALAZINE  25 mg Oral BID  . labetalol  200 mg Oral BID  . losartan  100 mg Oral Daily  . rosuvastatin  20 mg Oral Daily  . sodium chloride flush  3 mL Intravenous Q12H    Infusions:   No Known Allergies  Social History   Socioeconomic History  . Marital status: Widowed    Spouse name: Not on file  . Number of children: Not on file  . Years of education: Not on file  . Highest education level: Not on file  Occupational History  . Not on file  Social Needs  . Financial resource strain: Not on file  . Food insecurity:    Worry: Not on file    Inability: Not on file  . Transportation needs:    Medical: Not on file    Non-medical:  Not on file  Tobacco Use  . Smoking status: Never Smoker  . Smokeless tobacco: Never Used  Substance and Sexual Activity  . Alcohol use: No  . Drug use: No  . Sexual activity: Not Currently  Lifestyle  . Physical activity:    Days per week: Not on file    Minutes per session: Not on file  . Stress: Not on file  Relationships  . Social connections:    Talks on phone: Not on file    Gets together: Not on file    Attends religious service: Not on file    Active member of club or organization: Not on file    Attends meetings of clubs or organizations: Not on file    Relationship status: Not on file  . Intimate partner violence:    Fear of current or ex partner: Not on file    Emotionally abused: Not on file    Physically abused: Not on file    Forced sexual activity: Not on file  Other Topics Concern  . Not on file  Social History Narrative  .  Not on file    No family history on file.  PHYSICAL EXAM: Vitals:   10/24/17 0436 10/24/17 0807  BP: (!) 140/50 (!) 147/63  Pulse: 70 60  Resp:  18  Temp: 98.2 F (36.8 C) 98.4 F (36.9 C)  SpO2: 90% 95%     Intake/Output Summary (Last 24 hours) at 10/24/2017 0852 Last data filed at 10/24/2017 0700 Gross per 24 hour  Intake -  Output 1325 ml  Net -1325 ml    General:  Well appearing. No respiratory difficulty HEENT: normal Neck: supple. no JVD. Carotids 2+ bilat; no bruits. No lymphadenopathy or thryomegaly appreciated. Cor: PMI nondisplaced. Regular rate & rhythm. No rubs, gallops or murmurs. Lungs: clear Abdomen: soft, nontender, nondistended. No hepatosplenomegaly. No bruits or masses. Good bowel sounds. Extremities: no cyanosis, clubbing, rash, edema Neuro: alert & oriented x 3, cranial nerves grossly intact. moves all 4 extremities w/o difficulty. Affect pleasant.  ECG: Normal sinus rhythm 63bpm  Results for orders placed or performed during the hospital encounter of 10/23/17 (from the past 24 hour(s))  Troponin I      Status: None   Collection Time: 10/23/17  1:18 PM  Result Value Ref Range   Troponin I <0.03 <0.03 ng/mL   Ct Angio Chest Aorta W And/or Wo Contrast  Result Date: 10/23/2017 CLINICAL DATA:  Left-sided chest pain on and off all day. EXAM: CT ANGIOGRAPHY CHEST WITH CONTRAST TECHNIQUE: Multidetector CT imaging of the chest was performed using the standard protocol during bolus administration of intravenous contrast. Multiplanar CT image reconstructions and MIPs were obtained to evaluate the vascular anatomy. CONTRAST:  42mL OMNIPAQUE IOHEXOL 350 MG/ML SOLN COMPARISON:  01/02/2014 FINDINGS: Cardiovascular: Conventional branch pattern of the great vessels with atherosclerosis. Atherosclerotic nonaneurysmal thoracic aorta without dissection. Left main and three-vessel coronary arteriosclerosis is identified. The heart is top-normal in size without pericardial effusion. No acute pulmonary embolus to the proximal segmental level. Satisfactory pulmonary arterial opacification. Mediastinum/Nodes: No thyromegaly. Tiny cystic nodule in the left thyroid lobe measuring 5 mm. Calcified mediastinal and hilar lymph nodes compatible with old granulomatous disease. No adenopathy. Trachea and mainstem bronchi are patent. Esophagus is unremarkable and nondilated. Lungs/Pleura: Chronic mild interstitial prominence and subpleural areas of scarring in the upper lobes. Bronchiectasis is identified to both lower lobes with atelectasis and/or scarring. Moderate-sized paraesophageal hernia is redemonstrated. No pneumothorax or pulmonary consolidations. No dominant mass. Upper Abdomen: Uncomplicated cholelithiasis. Partially included left renal simple cyst off the interpolar aspect measuring 1.7 cm in diameter. No acute abnormality. Colonic interposition is noted over the liver. Musculoskeletal: Thoracolumbar spondylosis. No aggressive osseous lesions. Review of the MIP images confirms the above findings. IMPRESSION: 1. No acute  pulmonary embolus. 2. Left main and three-vessel coronary arteriosclerosis. 3. Calcified mediastinal and hilar lymph nodes compatible with old granulomatous disease. 4. Nonaneurysmal atherosclerotic aorta. 5. Moderate paraesophageal hernia. 6. Chronic interstitial disease scarring bilaterally. Aortic Atherosclerosis (ICD10-I70.0) and Emphysema (ICD10-J43.9). Electronically Signed   By: Ashley Royalty M.D.   On: 10/23/2017 02:03     ASSESSMENT AND PLAN: Chest pain: Chest pain has resolved, no EKG changes. Echo pending. Blood pressure much better controlled.  Continue medical therapy aspirin, Plavix, hydralazine 25mg  BID, losartan 100mg , labetalol 200mg  BID, and statin. If no acute changes on echo, advise discharge today with outpatient follow up Wednesday 10/26/2017 at 11:30 am with [place], NP-C Cell: [phone]

## 2017-10-24 NOTE — Plan of Care (Signed)

## 2019-05-22 ENCOUNTER — Emergency Department
Admission: EM | Admit: 2019-05-22 | Discharge: 2019-05-22 | Disposition: A | Discharge status: Home / Self Care | Payer: Medicare HMO | Attending: Emergency Medicine | Admitting: Emergency Medicine

## 2019-05-22 ENCOUNTER — Other Ambulatory Visit: Payer: Self-pay

## 2019-05-22 ENCOUNTER — Encounter: Payer: Self-pay | Admitting: Emergency Medicine

## 2019-05-22 DIAGNOSIS — R410 Disorientation, unspecified: Secondary | ICD-10-CM | POA: Insufficient documentation

## 2019-05-22 DIAGNOSIS — Z79899 Other long term (current) drug therapy: Secondary | ICD-10-CM | POA: Diagnosis not present

## 2019-05-22 DIAGNOSIS — N189 Chronic kidney disease, unspecified: Secondary | ICD-10-CM | POA: Diagnosis not present

## 2019-05-22 DIAGNOSIS — I129 Hypertensive chronic kidney disease with stage 1 through stage 4 chronic kidney disease, or unspecified chronic kidney disease: Secondary | ICD-10-CM | POA: Diagnosis not present

## 2019-05-22 DIAGNOSIS — Z7982 Long term (current) use of aspirin: Secondary | ICD-10-CM | POA: Insufficient documentation

## 2019-05-22 DIAGNOSIS — R630 Anorexia: Secondary | ICD-10-CM | POA: Insufficient documentation

## 2019-05-22 DIAGNOSIS — R4182 Altered mental status, unspecified: Secondary | ICD-10-CM | POA: Diagnosis present

## 2019-05-22 DIAGNOSIS — Z853 Personal history of malignant neoplasm of breast: Secondary | ICD-10-CM | POA: Insufficient documentation

## 2019-05-22 LAB — URINALYSIS, COMPLETE (UACMP) WITH MICROSCOPIC
Bacteria, UA: NONE SEEN
Bilirubin Urine: NEGATIVE
Glucose, UA: NEGATIVE mg/dL
Hgb urine dipstick: NEGATIVE
Ketones, ur: 5 mg/dL — AB
Nitrite: NEGATIVE
Protein, ur: 30 mg/dL — AB
Specific Gravity, Urine: 1.018 (ref 1.005–1.030)
pH: 6 (ref 5.0–8.0)

## 2019-05-22 LAB — COMPREHENSIVE METABOLIC PANEL
ALT: 7 U/L (ref 0–44)
AST: 23 U/L (ref 15–41)
Albumin: 3.3 g/dL — ABNORMAL LOW (ref 3.5–5.0)
Alkaline Phosphatase: 63 U/L (ref 38–126)
Anion gap: 9 (ref 5–15)
BUN: 18 mg/dL (ref 8–23)
CO2: 30 mmol/L (ref 22–32)
Calcium: 9.2 mg/dL (ref 8.9–10.3)
Chloride: 99 mmol/L (ref 98–111)
Creatinine, Ser: 0.79 mg/dL (ref 0.44–1.00)
GFR calc Af Amer: >60 mL/min (ref >60)
GFR calc non Af Amer: >60 mL/min (ref >60)
Glucose, Bld: 83 mg/dL (ref 70–99)
Potassium: 3.9 mmol/L (ref 3.5–5.1)
Sodium: 138 mmol/L (ref 135–145)
Total Bilirubin: 2.4 mg/dL — ABNORMAL HIGH (ref 0.3–1.2)
Total Protein: 7 g/dL (ref 6.5–8.1)

## 2019-05-22 LAB — CBC
HCT: 40.1 % (ref 36.0–46.0)
Hemoglobin: 13.3 g/dL (ref 12.0–15.0)
MCH: 30.7 pg (ref 26.0–34.0)
MCHC: 33.2 g/dL (ref 30.0–36.0)
MCV: 92.6 fL (ref 80.0–100.0)
Platelets: 144 10*3/uL — ABNORMAL LOW (ref 150–400)
RBC: 4.33 MIL/uL (ref 3.87–5.11)
RDW: 12.8 % (ref 11.5–15.5)
WBC: 8.8 10*3/uL (ref 4.0–10.5)
nRBC: 0 % (ref 0.0–0.2)

## 2019-05-22 MED ORDER — SODIUM CHLORIDE 0.9% FLUSH: 3.0000 mL | Freq: Once | INTRAVENOUS | Status: DC

## 2019-05-22 MED ORDER — LACTATED RINGERS IV BOLUS
1000.0000 mL | Freq: Once | INTRAVENOUS | Status: AC
  Administered 2019-05-22: 16:00:00 1000 mL via INTRAVENOUS

## 2019-05-22 NOTE — ED Provider Notes (Signed)
[facility] Emergency Department Provider Note   ____________________________________________   First MD Initiated Contact with Patient 05/22/19 1502     (approximate)  I have reviewed the triage vital signs and the nursing notes.   HISTORY  Chief Complaint Altered Mental Status    HPI KINSEE KLINKNER is a 86 y.o. female with past medical history of CAD, hypertension, and CKD presents to the ED for confusion.  Patient son provides the majority of the history and states that she has not been acting like herself at times since early December.  She had a fall at that time and was evaluated at an outside hospital, where her son states she had a head CT that was negative.  Since then, she has had intermittent episodes of confusion, will think that other people or snakes are in her home and called fire department.  She has also had difficulty with her memory as well as decreased appetite.  At one point, she was diagnosed with a UTI, but son states she did not take the full course of antibiotics.  He brought her to be evaluated at her PCPs office earlier today, but was referred to the ED due to confusion and poor appetite.  Patient currently denies any complaints including fevers, chills, chest pain, cough, shortness of breath, dysuria, or hematuria.        Past Medical History:  Diagnosis Date  . Cancer ([place])    breast  . Chronic kidney disease   . Coronary artery disease   . GERD (gastroesophageal reflux disease)   . Gout   . HH (hiatus hernia)   . Hypercholesteremia   . Hypertension   . Murmur   . Palpitations     Patient Active Problem List   Diagnosis Date Noted  . Chest pain 10/23/2017    Past Surgical History:  Procedure Laterality Date  . APPENDECTOMY    . cardiac stents    . CATARACT EXTRACTION W/PHACO Right 09/15/2015   Procedure: CATARACT EXTRACTION PHACO AND INTRAOCULAR LENS PLACEMENT (IOC);  Surgeon: Estill Cotta, MD;  Location:  [facility] ORS;  Service: Ophthalmology;  Laterality: Right;  [place] 00:57.6 AP% 23.9 CDE 25.53 fluid pack lot # [id number]  . cornary stent    . LOBECTOMY     lung  . MASTECTOMY      Prior to Admission medications   Medication Sig Start Date End Date Taking? Authorizing Provider  aspirin 81 MG tablet Take 1 tablet (81 mg total) by mouth daily. 10/24/17   Demetrios Loll, MD  clopidogrel (PLAVIX) 75 MG tablet Take 75 mg by mouth daily.    [provider]  hydrALAZINE (APRESOLINE) 25 MG tablet Take 1 tablet by mouth 2 (two) times daily.    [provider]  labetalol (NORMODYNE) 200 MG tablet Take 1 tablet by mouth 2 (two) times daily.    [provider]  losartan (COZAAR) 100 MG tablet Take 1 tablet by mouth daily. 06/08/17   [provider]  rosuvastatin (CRESTOR) 20 MG tablet Take 20 mg by mouth daily.     [provider]    Allergies Patient has no known allergies.  No family history on file.  Social History Social History   Tobacco Use  . Smoking status: Never Smoker  . Smokeless tobacco: Never Used  Substance Use Topics  . Alcohol use: No  . Drug use: No    Review of Systems  Constitutional: No fever/chills Eyes: No visual changes. ENT: No  sore throat. Cardiovascular: Denies chest pain. Respiratory: Denies shortness of breath. Gastrointestinal: No abdominal pain.  No nausea, no vomiting.  No diarrhea.  No constipation. Genitourinary: Negative for dysuria. Musculoskeletal: Negative for back pain. Skin: Negative for rash. Neurological: Negative for headaches, focal weakness or numbness.  Positive for confusion.  ____________________________________________   PHYSICAL EXAM:  VITAL SIGNS: ED Triage Vitals  Enc Vitals Group     BP 05/22/19 1050 (!) 167/72     Pulse Rate 05/22/19 1050 81     Resp 05/22/19 1050 16     Temp 05/22/19 1050 98.5 F (36.9 C)     Temp Source 05/22/19 1050 Oral     SpO2 05/22/19 1050 93 %     Weight  05/22/19 1051 124 lb (56.2 kg)     Height 05/22/19 1051 5\' 4"  (1.626 m)     Head Circumference --      Peak Flow --      Pain Score 05/22/19 1051 0     Pain Loc --      Pain Edu? --      Excl. in [place]? --     Constitutional: Alert and oriented to person and place but not time. Eyes: Conjunctivae are normal.  Pupils equal round and reactive to light bilaterally, extraocular movements intact. Head: Atraumatic. Nose: No congestion/rhinnorhea. Mouth/Throat: Mucous membranes are moist. Neck: Normal ROM Cardiovascular: Normal rate, regular rhythm. Grossly normal heart sounds. Respiratory: Normal respiratory effort.  No retractions. Lungs CTAB. Gastrointestinal: Soft and nontender. No distention. Genitourinary: deferred Musculoskeletal: No lower extremity tenderness nor edema. Neurologic:  Normal speech and language. No gross focal neurologic deficits are appreciated. Skin:  Skin is warm, dry and intact. No rash noted. Psychiatric: Mood and affect are normal. Speech and behavior are normal.  ____________________________________________   LABS (all labs ordered are listed, but only abnormal results are displayed)  Labs Reviewed  COMPREHENSIVE METABOLIC PANEL - Abnormal; Notable for the following components:      Result Value   Albumin 3.3 (*)    Total Bilirubin 2.4 (*)    All other components within normal limits  CBC - Abnormal; Notable for the following components:   Platelets 144 (*)    All other components within normal limits  URINALYSIS, COMPLETE (UACMP) WITH MICROSCOPIC - Abnormal; Notable for the following components:   Color, Urine YELLOW (*)    APPearance CLEAR (*)    Ketones, ur 5 (*)    Protein, ur 30 (*)    Leukocytes,Ua TRACE (*)    All other components within normal limits  URINE CULTURE    PROCEDURES  Procedure(s) performed (including Critical Care):  Procedures   ____________________________________________   INITIAL IMPRESSION / ASSESSMENT AND PLAN /  ED COURSE       86 year old female presents to the ED with episodes of confusion since early December, when she had a fall with negative traumatic work-up.  There is concerned she did not complete a course of antibiotics for UTI although patient currently denies any urinary or other infectious symptoms.  Vital signs are reassuring and she has a nonfocal neurologic exam, although is disoriented to year.  Do not feel intracranial imaging is indicated given lack of focal neurologic deficits and chronicity of confusion.  Lab work thus far is reassuring, CBC and BMP are unremarkable and UA without clear evidence of infection.  We will culture the urine and hydrate with IV fluids given her poor appetite, but she is appropriate for discharge home for  follow-up with PCP as well as neurology to potentially confirm diagnosis of dementia.  Counseled patient and son to return to the ED for new or worsening symptoms.      ____________________________________________   FINAL CLINICAL IMPRESSION(S) / ED DIAGNOSES  Final diagnoses:  Confusion  Decreased appetite     ED Discharge Orders    None       Note:  This document was prepared using Dragon voice recognition software and may include unintentional dictation errors.   Blake Divine, MD 05/22/19 2055

## 2019-05-22 NOTE — ED Notes (Signed)
Son states his mom didn't take the medicine for her UTI because it upset her stomach, so good chance she has ongoing uti, however, since she fell a few months ago, she "just hasn't been the same" mentally per son.

## 2019-05-22 NOTE — ED Triage Notes (Signed)
Pt here with c/o "forgetting things." Son states she was diagnosed with UTI in December, has been confused since around that time. Wants her urine checked to see if that is still the issue. Alert and oriented in triage, able to answer questions appropriately. Son in triage with pt. Pt lives alone, however, family is close by.

## 2019-05-22 NOTE — ED Triage Notes (Signed)
Pt over from [facility] for altered mental status.  Pt son reports pt had a UTI back in December but she did not take her meds like she was supposed to and he thinks maybe that is what is causing her AMS.

## 2019-05-22 NOTE — ED Notes (Signed)
Pt given food tray and water.

## 2019-05-23 LAB — URINE CULTURE: Culture: NO GROWTH

## 2019-07-25 ENCOUNTER — Other Ambulatory Visit: Payer: Self-pay

## 2019-07-25 ENCOUNTER — Emergency Department: Payer: Medicare Other

## 2019-07-25 ENCOUNTER — Inpatient Hospital Stay
Admission: EM | Admit: 2019-07-25 | Discharge: 2019-08-06 | DRG: 480 | Disposition: A | Discharge status: Skilled Nursing Facility | Payer: Medicare Other | Attending: Internal Medicine | Admitting: Internal Medicine

## 2019-07-25 DIAGNOSIS — F0390 Unspecified dementia without behavioral disturbance: Secondary | ICD-10-CM | POA: Diagnosis present

## 2019-07-25 DIAGNOSIS — D62 Acute posthemorrhagic anemia: Secondary | ICD-10-CM | POA: Diagnosis not present

## 2019-07-25 DIAGNOSIS — N189 Chronic kidney disease, unspecified: Secondary | ICD-10-CM | POA: Diagnosis present

## 2019-07-25 DIAGNOSIS — Z7902 Long term (current) use of antithrombotics/antiplatelets: Secondary | ICD-10-CM

## 2019-07-25 DIAGNOSIS — E7800 Pure hypercholesterolemia, unspecified: Secondary | ICD-10-CM | POA: Diagnosis present

## 2019-07-25 DIAGNOSIS — U071 COVID-19: Secondary | ICD-10-CM | POA: Diagnosis present

## 2019-07-25 DIAGNOSIS — K449 Diaphragmatic hernia without obstruction or gangrene: Secondary | ICD-10-CM | POA: Diagnosis present

## 2019-07-25 DIAGNOSIS — S72002A Fracture of unspecified part of neck of left femur, initial encounter for closed fracture: Secondary | ICD-10-CM | POA: Diagnosis present

## 2019-07-25 DIAGNOSIS — G309 Alzheimer's disease, unspecified: Secondary | ICD-10-CM | POA: Diagnosis not present

## 2019-07-25 DIAGNOSIS — K219 Gastro-esophageal reflux disease without esophagitis: Secondary | ICD-10-CM | POA: Diagnosis present

## 2019-07-25 DIAGNOSIS — Z01818 Encounter for other preprocedural examination: Secondary | ICD-10-CM

## 2019-07-25 DIAGNOSIS — W1830XA Fall on same level, unspecified, initial encounter: Secondary | ICD-10-CM | POA: Diagnosis present

## 2019-07-25 DIAGNOSIS — I2119 ST elevation (STEMI) myocardial infarction involving other coronary artery of inferior wall: Secondary | ICD-10-CM | POA: Diagnosis not present

## 2019-07-25 DIAGNOSIS — K5900 Constipation, unspecified: Secondary | ICD-10-CM | POA: Diagnosis not present

## 2019-07-25 DIAGNOSIS — M25552 Pain in left hip: Secondary | ICD-10-CM | POA: Diagnosis present

## 2019-07-25 DIAGNOSIS — E785 Hyperlipidemia, unspecified: Secondary | ICD-10-CM | POA: Diagnosis present

## 2019-07-25 DIAGNOSIS — I2510 Atherosclerotic heart disease of native coronary artery without angina pectoris: Secondary | ICD-10-CM | POA: Diagnosis present

## 2019-07-25 DIAGNOSIS — F0280 Dementia in other diseases classified elsewhere without behavioral disturbance: Secondary | ICD-10-CM | POA: Diagnosis not present

## 2019-07-25 DIAGNOSIS — M109 Gout, unspecified: Secondary | ICD-10-CM | POA: Diagnosis present

## 2019-07-25 DIAGNOSIS — I10 Essential (primary) hypertension: Secondary | ICD-10-CM | POA: Diagnosis not present

## 2019-07-25 DIAGNOSIS — Z66 Do not resuscitate: Secondary | ICD-10-CM | POA: Diagnosis present

## 2019-07-25 DIAGNOSIS — Z515 Encounter for palliative care: Secondary | ICD-10-CM

## 2019-07-25 DIAGNOSIS — Z955 Presence of coronary angioplasty implant and graft: Secondary | ICD-10-CM | POA: Diagnosis not present

## 2019-07-25 DIAGNOSIS — S72142A Displaced intertrochanteric fracture of left femur, initial encounter for closed fracture: Principal | ICD-10-CM | POA: Diagnosis present

## 2019-07-25 DIAGNOSIS — I129 Hypertensive chronic kidney disease with stage 1 through stage 4 chronic kidney disease, or unspecified chronic kidney disease: Secondary | ICD-10-CM | POA: Diagnosis present

## 2019-07-25 DIAGNOSIS — Z853 Personal history of malignant neoplasm of breast: Secondary | ICD-10-CM | POA: Diagnosis not present

## 2019-07-25 DIAGNOSIS — W19XXXD Unspecified fall, subsequent encounter: Secondary | ICD-10-CM | POA: Diagnosis not present

## 2019-07-25 DIAGNOSIS — Z419 Encounter for procedure for purposes other than remedying health state, unspecified: Secondary | ICD-10-CM

## 2019-07-25 DIAGNOSIS — Z7982 Long term (current) use of aspirin: Secondary | ICD-10-CM | POA: Diagnosis not present

## 2019-07-25 DIAGNOSIS — W19XXXA Unspecified fall, initial encounter: Secondary | ICD-10-CM

## 2019-07-25 LAB — CBC WITH DIFFERENTIAL/PLATELET
Abs Immature Granulocytes: 0.02 10*3/uL (ref 0.00–0.07)
Basophils Absolute: 0.1 10*3/uL (ref 0.0–0.1)
Basophils Relative: 1 %
Eosinophils Absolute: 0.1 10*3/uL (ref 0.0–0.5)
Eosinophils Relative: 1 %
HCT: 40.1 % (ref 36.0–46.0)
Hemoglobin: 13.4 g/dL (ref 12.0–15.0)
Immature Granulocytes: 0 %
Lymphocytes Relative: 15 %
Lymphs Abs: 1.1 10*3/uL (ref 0.7–4.0)
MCH: 31.5 pg (ref 26.0–34.0)
MCHC: 33.4 g/dL (ref 30.0–36.0)
MCV: 94.1 fL (ref 80.0–100.0)
Monocytes Absolute: 0.7 10*3/uL (ref 0.1–1.0)
Monocytes Relative: 9 %
Neutro Abs: 5.2 10*3/uL (ref 1.7–7.7)
Neutrophils Relative %: 74 %
Platelets: 195 10*3/uL (ref 150–400)
RBC: 4.26 MIL/uL (ref 3.87–5.11)
RDW: 12.2 % (ref 11.5–15.5)
WBC: 7.2 10*3/uL (ref 4.0–10.5)
nRBC: 0 % (ref 0.0–0.2)

## 2019-07-25 LAB — COMPREHENSIVE METABOLIC PANEL
ALT: 11 U/L (ref 0–44)
AST: 19 U/L (ref 15–41)
Albumin: 2.8 g/dL — ABNORMAL LOW (ref 3.5–5.0)
Alkaline Phosphatase: 69 U/L (ref 38–126)
Anion gap: 9 (ref 5–15)
BUN: 16 mg/dL (ref 8–23)
CO2: 31 mmol/L (ref 22–32)
Calcium: 9.2 mg/dL (ref 8.9–10.3)
Chloride: 101 mmol/L (ref 98–111)
Creatinine, Ser: 0.9 mg/dL (ref 0.44–1.00)
GFR calc Af Amer: >60 mL/min (ref >60)
GFR calc non Af Amer: 54 mL/min — ABNORMAL LOW (ref >60)
Glucose, Bld: 114 mg/dL — ABNORMAL HIGH (ref 70–99)
Potassium: 3.3 mmol/L — ABNORMAL LOW (ref 3.5–5.1)
Sodium: 141 mmol/L (ref 135–145)
Total Bilirubin: 1.3 mg/dL — ABNORMAL HIGH (ref 0.3–1.2)
Total Protein: 6.3 g/dL — ABNORMAL LOW (ref 6.5–8.1)

## 2019-07-25 LAB — SAMPLE TO BLOOD BANK

## 2019-07-25 LAB — PROTIME-INR
INR: 1 (ref 0.8–1.2)
Prothrombin Time: 13 seconds (ref 11.4–15.2)

## 2019-07-25 LAB — RESPIRATORY PANEL BY RT PCR (FLU A&B, COVID)
Influenza A by PCR: NEGATIVE
Influenza B by PCR: NEGATIVE
SARS Coronavirus 2 by RT PCR: POSITIVE — AB

## 2019-07-25 MED ORDER — SODIUM CHLORIDE 0.9 % IV SOLN: INTRAVENOUS | Status: DC

## 2019-07-25 MED ORDER — MORPHINE SULFATE (PF) 2 MG/ML IV SOLN
0.5000 mg | INTRAVENOUS | Status: DC | PRN
  Administered 2019-07-26: 0.5 mg via INTRAVENOUS
  Filled 2019-07-25: qty 1

## 2019-07-25 MED ORDER — ZOLPIDEM TARTRATE 5 MG PO TABS
5.0000 mg | ORAL_TABLET | Freq: Every evening | ORAL | Status: DC | PRN
  Administered 2019-07-26: 5 mg via ORAL
  Filled 2019-07-25: qty 1

## 2019-07-25 MED ORDER — ACETAMINOPHEN 325 MG PO TABS
650.0000 mg | ORAL_TABLET | Freq: Four times a day (QID) | ORAL | Status: DC | PRN
  Administered 2019-07-26: 650 mg via ORAL
  Filled 2019-07-25: qty 2

## 2019-07-25 MED ORDER — ONDANSETRON HCL 4 MG PO TABS: 4.0000 mg | ORAL_TABLET | Freq: Four times a day (QID) | ORAL | Status: DC | PRN

## 2019-07-25 MED ORDER — MORPHINE SULFATE (PF) 2 MG/ML IV SOLN
2.0000 mg | Freq: Once | INTRAVENOUS | Status: AC
  Administered 2019-07-25: 2 mg via INTRAVENOUS
  Filled 2019-07-25: qty 1

## 2019-07-25 MED ORDER — SENNOSIDES-DOCUSATE SODIUM 8.6-50 MG PO TABS: 1.0000 | ORAL_TABLET | Freq: Every evening | ORAL | Status: DC | PRN

## 2019-07-25 MED ORDER — HYDROCODONE-ACETAMINOPHEN 5-325 MG PO TABS: 1.0000 | ORAL_TABLET | Freq: Four times a day (QID) | ORAL | Status: DC | PRN

## 2019-07-25 MED ORDER — ACETAMINOPHEN 650 MG RE SUPP: 650.0000 mg | Freq: Four times a day (QID) | RECTAL | Status: DC | PRN

## 2019-07-25 MED ORDER — ONDANSETRON HCL 4 MG/2ML IJ SOLN: 4.0000 mg | Freq: Four times a day (QID) | INTRAMUSCULAR | Status: DC | PRN

## 2019-07-25 NOTE — H&P (Signed)
History and Physical    [name] [name] [id number] DOB: 06/10/1921 DOA: 07/25/2019  PCP: Jodi Marble, MD   Patient coming from: ALF I have personally briefly reviewed patient's old medical records in [place]  Chief Complaint: Fall, left hip pain  HPI: [name] [name] is a 86 y.o. female with medical history significant for dementia, with sundowning, coronary artery disease status post LAD stent, followed by Dr. Humphrey Rolls, hyperlipidemia and hypertension who was brought in for evaluation after she suffered what appears to be a mechanical fall.  Patient attempted to sit down and missed and fell onto her left hip and was unable to bear weight thereafter.  She had no loss of consciousness did not hit her head  ED Course: On arrival in the emergency room vitals were within normal limits except for slightly elevated blood pressure 150/69, hip x-ray showed proximal left hip fracture, blood work was unremarkable.  Chest x-ray showed no acute findings.  Pending at time of decision to admit.  Urinalysis clear.  Tested positive for Covid after admission  Review of Systems: Unreliable as patient has dementia and she appears confused  Past Medical History:  Diagnosis Date  . Cancer ([place])    breast  . Chronic kidney disease   . Coronary artery disease   . GERD (gastroesophageal reflux disease)   . Gout   . HH (hiatus hernia)   . Hypercholesteremia   . Hypertension   . Murmur   . Palpitations     Past Surgical History:  Procedure Laterality Date  . APPENDECTOMY    . cardiac stents    . CATARACT EXTRACTION W/PHACO Right 09/15/2015   Procedure: CATARACT EXTRACTION PHACO AND INTRAOCULAR LENS PLACEMENT (IOC);  Surgeon: Estill Cotta, MD;  Location: [facility] ORS;  Service: Ophthalmology;  Laterality: Right;  [place] 00:57.6 AP% 23.9 CDE 25.53 fluid pack lot # [id number]  . cornary stent    . LOBECTOMY     lung  . MASTECTOMY       reports that she has never smoked. She has never  used smokeless tobacco. She reports that she does not drink alcohol or use drugs.  No Known Allergies  No family history on file.   Prior to Admission medications   Medication Sig Start Date End Date Taking? Authorizing Provider  aspirin 81 MG tablet Take 1 tablet (81 mg total) by mouth daily. 10/24/17   Demetrios Loll, MD  clopidogrel (PLAVIX) 75 MG tablet Take 75 mg by mouth daily.    [provider]  hydrALAZINE (APRESOLINE) 25 MG tablet Take 1 tablet by mouth 2 (two) times daily.    [provider]  labetalol (NORMODYNE) 200 MG tablet Take 1 tablet by mouth 2 (two) times daily.    [provider]  losartan (COZAAR) 100 MG tablet Take 1 tablet by mouth daily. 06/08/17   [provider]  rosuvastatin (CRESTOR) 20 MG tablet Take 20 mg by mouth daily.     [provider]    Physical Exam: Vitals:   07/25/19 1819 07/25/19 1827 07/25/19 1829  BP:   (!) 150/69  Pulse:   82  Resp:   16  Temp: 98.2 F (36.8 C)    TempSrc: Oral    SpO2:   96%  Weight:  56.7 kg   Height:  5\' 6"  (1.676 m)      Vitals:   07/25/19 1819 07/25/19 1827 07/25/19 1829  BP:   (!) 150/69  Pulse:   82  Resp:   16  Temp: 98.2 F (36.8 C)    TempSrc: Oral    SpO2:   96%  Weight:  56.7 kg   Height:  5\' 6"  (1.676 m)     Constitutional: Alert and awake, oriented x1not in any acute distress. Eyes: PERLA, EOMI, irises appear normal, anicteric sclera,  ENMT: external ears and nose appear normal, normal hearing             Lips appears normal, oropharynx mucosa, tongue, posterior pharynx appear normal  Neck: neck appears normal, no masses, normal ROM, no thyromegaly, no JVD  CVS: S1-S2 clear, no murmur rubs or gallops,  , no carotid bruits, pedal pulses palpable, No LE edema Respiratory:  clear to auscultation bilaterally, no wheezing, rales or rhonchi. Respiratory effort normal. No accessory muscle use.  Abdomen: soft nontender, nondistended, normal bowel sounds, no  hepatosplenomegaly, no hernias Musculoskeletal: : no cyanosis, clubbing , no contractures or atrophyLeft leg in flexion and an external rotation Neuro: Cranial nerves II-XII intact, sensation, reflexes normal, strength Psych:, stable mood and affect,  Skin: no rashes or lesions or ulcers, no induration or nodules   Labs on Admission: I have personally reviewed following labs and imaging studies  CBC: Recent Labs  Lab 07/25/19 1818  WBC 7.2  NEUTROABS 5.2  HGB 13.4  HCT 40.1  MCV 94.1  PLT 0000000   Basic Metabolic Panel: Recent Labs  Lab 07/25/19 1818  NA 141  K 3.3*  CL 101  CO2 31  GLUCOSE 114*  BUN 16  CREATININE 0.90  CALCIUM 9.2   GFR: Estimated Creatinine Clearance: 33.5 mL/min (by C-G formula based on SCr of 0.9 mg/dL). Liver Function Tests: Recent Labs  Lab 07/25/19 1818  AST 19  ALT 11  ALKPHOS 69  BILITOT 1.3*  PROT 6.3*  ALBUMIN 2.8*   No results for input(s): LIPASE, AMYLASE in the last 168 hours. No results for input(s): AMMONIA in the last 168 hours. Coagulation Profile: No results for input(s): INR, PROTIME in the last 168 hours. Cardiac Enzymes: No results for input(s): CKTOTAL, CKMB, CKMBINDEX, TROPONINI in the last 168 hours. BNP (last 3 results) No results for input(s): PROBNP in the last 8760 hours. HbA1C: No results for input(s): HGBA1C in the last 72 hours. CBG: No results for input(s): GLUCAP in the last 168 hours. Lipid Profile: No results for input(s): CHOL, HDL, LDLCALC, TRIG, CHOLHDL, LDLDIRECT in the last 72 hours. Thyroid Function Tests: No results for input(s): TSH, T4TOTAL, FREET4, T3FREE, THYROIDAB in the last 72 hours. Anemia Panel: No results for input(s): VITAMINB12, FOLATE, FERRITIN, TIBC, IRON, RETICCTPCT in the last 72 hours. Urine analysis:    Component Value Date/Time   COLORURINE YELLOW (A) 05/22/2019 1059   APPEARANCEUR CLEAR (A) 05/22/2019 1059   APPEARANCEUR Clear 03/19/2014 0713   LABSPEC 1.018 05/22/2019  1059   LABSPEC 1.018 03/19/2014 0713   PHURINE 6.0 05/22/2019 1059   GLUCOSEU NEGATIVE 05/22/2019 1059   GLUCOSEU Negative 03/19/2014 0713   HGBUR NEGATIVE 05/22/2019 1059   BILIRUBINUR NEGATIVE 05/22/2019 1059   BILIRUBINUR Negative 03/19/2014 0713   KETONESUR 5 (A) 05/22/2019 1059   PROTEINUR 30 (A) 05/22/2019 1059   NITRITE NEGATIVE 05/22/2019 1059   LEUKOCYTESUR TRACE (A) 05/22/2019 1059   LEUKOCYTESUR Negative 03/19/2014 0713    Radiological Exams on Admission: DG Chest 1 View  Result Date: 07/25/2019 CLINICAL DATA:  Recent fall with known left hip fracture, initial encounter EXAM: CHEST  1 VIEW COMPARISON:  10/23/2017 FINDINGS: Cardiac  shadow is within normal limits. Aortic calcifications are seen. The lungs are well aerated with mild interstitial changes stable from the previous exam. No focal infiltrate or sizable effusion is seen. Chronic hiatal hernia is noted on the left. IMPRESSION: Chronic interstitial changes and hiatal hernia. No acute abnormality noted. Electronically Signed   By: Inez Catalina M.D.   On: 07/25/2019 19:20   DG Pelvis 1-2 Views  Result Date: 07/25/2019 CLINICAL DATA:  Recent fall with left hip pain, initial encounter EXAM: PELVIS - 1 VIEW COMPARISON:  None. FINDINGS: Comminuted fracture of the proximal left femur is again noted. Pelvic ring is intact. Degenerative changes of the right hip joint are noted with some remodeling of the femoral head. Degenerative changes of lumbar spine are seen as well. IMPRESSION: Proximal left femoral fracture as described. Electronically Signed   By: Inez Catalina M.D.   On: 07/25/2019 19:23   DG Femur Min 2 Views Left  Result Date: 07/25/2019 CLINICAL DATA:  Recent fall with left leg pain, initial encounter EXAM: LEFT FEMUR 2 VIEWS COMPARISON:  None. FINDINGS: There is a comminuted fracture of the left femur in the intratrochanteric region with impaction and angulation at the fracture site. The femoral head is well seated. The  more distal femur shows no acute abnormality. Diffuse vascular calcifications are seen. No soft tissue abnormality is noted. IMPRESSION: Comminuted intratrochanteric left femoral fracture with impaction and angulation as described. Electronically Signed   By: Inez Catalina M.D.   On: 07/25/2019 19:18    EKG: Independently reviewed.   Assessment/Plan Principal Problem:   Fracture, proximal femur, left, closed, initial encounter ([place])   Accidental fall   Preoperative clearance -Left hip fracture from an accidental fall -Patient has a history of CAD and hypertension, but has no chest pain.  EKG nonacute -Consider patient low to moderate risk of perioperative cardiopulmonary events -Echocardiogram will be ordered to assist with preoperative clearance for anticipated procedure within the next 24 hours  COVID-19 infection --Airborne precautions -Is asymptomatic    CAD (coronary artery disease) -No chest pain -Hold antiplatelets but can continue statin until n.p.o.    Essential hypertension -IV labetalol as needed systolic over [id number]   Dementia without behavioral disturbance ([place]) -Son reports that patient does have sundowning -Redirection, IV Haldol if medical restraints needed    DVT prophylaxis: SCD given surgery Code Status: DNR Family Communication:  Son Sandia Gwinn  Disposition Plan: Back to previous home environment Consults called: Leim Fabry , ortho , Dr. Humphrey Rolls, cardiology for clearance Status:inp    Athena Masse MD [facility]     07/25/2019, 7:50 PM

## 2019-07-25 NOTE — ED Notes (Signed)
This RN back in the room. Pt calling out for water. Explained pt is npo and she could wet her lips with washcloth. Pt asking this RN to stay. Explained I could not stay and that again her family is on the way. Pt possibly not remembering due to morphine.

## 2019-07-25 NOTE — ED Notes (Signed)
Date and time results received: 07/25/19 2124  Test: SARS coronavirus  Critical Value: Positive  Name of Provider Notified: Rufina Falco, NP

## 2019-07-25 NOTE — ED Notes (Signed)
Pt given phone to speak with son. Son has been updated on status.

## 2019-07-25 NOTE — Progress Notes (Signed)
Full consult note to follow tomorrow AM.  Called by ED staff. Imaging reviewed.  - Plan for surgery tomorrow, likely afternoon.  - NPO after midnight - Hold anticoagulation - Admit to Hospitalist team.

## 2019-07-25 NOTE — ED Notes (Signed)
This RN back in the room. Pt requesting water. Explained she is NPO and gave her a wet washcloth to wipe her face and mouth with.

## 2019-07-25 NOTE — ED Notes (Signed)
This Rn to see if pt has any needs. Pt states "that depends." Pt asked about surgery. Reminded pt that surgery will be tomorrow. Pt states she needs to call her family. Reminded pt that she just spoke with her son. Pt then accused this RN of eavesdropping. Pt continues to be confused. Will continue to monitor.

## 2019-07-25 NOTE — ED Triage Notes (Signed)
Pt to the er via ems for left femur pain. Pt slid off a stool in the kitchen . Pt normally very active and uses a walker for ambulation. Over the last 56 months there has been some failure to thrive. Pt currently not taking any meds per EMS. Pt has shortening and rotation of the left leg. Pt is able to answer questions.

## 2019-07-25 NOTE — ED Notes (Signed)
Pt repositioned in bed for comfort. Robe removed and cardiac monitor reapplied. Pt given water to drink. Pt repeating previous questions. Pt questions answered again.

## 2019-07-25 NOTE — ED Notes (Signed)
Pt asking for someone to sit with her. Family contacted by Dr Jimmye Norman and Santiago Glad is on the way to sit with her. Pt given pain meds for pain and overhead light out for comfort.

## 2019-07-25 NOTE — ED Provider Notes (Addendum)
[facility] Emergency Department Provider Note       Time seen: ----------------------------------------- 6:15 PM on 07/25/2019 -----------------------------------------   I have reviewed the triage vital signs and the nursing notes.  HISTORY   Chief Complaint No chief complaint on file.    HPI [name] [name] is a 86 y.o. female with a history of breast cancer, chronic kidney disease, GERD, hyperlipidemia, hypertension, dementia who presents to the ED for a fall with left leg pain.  Patient is having pain in the left femur, has difficulty with weightbearing.  Reportedly she had a fall where she sat down hard on a bench.  Past Medical History:  Diagnosis Date  . Cancer ([place])    breast  . Chronic kidney disease   . Coronary artery disease   . GERD (gastroesophageal reflux disease)   . Gout   . HH (hiatus hernia)   . Hypercholesteremia   . Hypertension   . Murmur   . Palpitations     Patient Active Problem List   Diagnosis Date Noted  . Chest pain 10/23/2017    Past Surgical History:  Procedure Laterality Date  . APPENDECTOMY    . cardiac stents    . CATARACT EXTRACTION W/PHACO Right 09/15/2015   Procedure: CATARACT EXTRACTION PHACO AND INTRAOCULAR LENS PLACEMENT (IOC);  Surgeon: Estill Cotta, MD;  Location: [facility] ORS;  Service: Ophthalmology;  Laterality: Right;  [place] 00:57.6 AP% 23.9 CDE 25.53 fluid pack lot # [id number]  . cornary stent    . LOBECTOMY     lung  . MASTECTOMY      Allergies Patient has no known allergies.  Social History Social History   Tobacco Use  . Smoking status: Never Smoker  . Smokeless tobacco: Never Used  Substance Use Topics  . Alcohol use: No  . Drug use: No    Review of Systems Constitutional: Negative for fever. Cardiovascular: Negative for chest pain. Respiratory: Negative for shortness of breath. Gastrointestinal: Negative for abdominal pain Musculoskeletal: Positive for left leg  pain Skin: Negative for rash. Neurological: Negative for headaches, focal weakness or numbness.  All systems negative/normal/unremarkable except as stated in the HPI  ____________________________________________   PHYSICAL EXAM:  VITAL SIGNS: ED Triage Vitals  Enc Vitals Group     BP      Pulse      Resp      Temp      Temp src      SpO2      Weight      Height      Head Circumference      Peak Flow      Pain Score      Pain Loc      Pain Edu?      Excl. in [place]?     Constitutional: Alert but disoriented, well appearing and in no distress. Eyes: Conjunctivae are normal. Normal extraocular movements. Cardiovascular: Normal rate, regular rhythm. No murmurs, rubs, or gallops. Respiratory: Normal respiratory effort without tachypnea nor retractions. Breath sounds are clear and equal bilaterally. No wheezes/rales/rhonchi. Gastrointestinal: Soft and nontender. Normal bowel sounds Musculoskeletal: Left lower extremity is shortened and externally rotated, tenderness noted over the left femur area Neurologic:  Normal speech and language. No gross focal neurologic deficits are appreciated.  Skin:  Skin is warm, dry and intact. No rash noted. Psychiatric: Mood and affect are normal. Speech and behavior are normal.  ___________________________________________  ED COURSE:  As part of my medical decision making, I reviewed  the following data within the [place] History obtained from family if available, nursing notes, old chart and ekg, as well as notes from prior ED visits. Patient presented for fall with left leg pain, we will assess with labs and imaging as indicated at this time.   Procedures  [name] [name] was evaluated in Emergency Department on 07/25/2019 for the symptoms described in the history of present illness. She was evaluated in the context of the global COVID-19 pandemic, which necessitated consideration that the patient might be at risk for  infection with the SARS-CoV-2 virus that causes COVID-19. Institutional protocols and algorithms that pertain to the evaluation of patients at risk for COVID-19 are in a state of rapid change based on information released by regulatory bodies including the CDC and federal and state organizations. These policies and algorithms were followed during the patient's care in the ED.  ____________________________________________   EKG: EKG interpreted by me, sinus rhythm with rate of 88 bpm, leftward axis, normal QT  LABS (pertinent positives/negatives)  Labs Reviewed  COMPREHENSIVE METABOLIC PANEL - Abnormal; Notable for the following components:      Result Value   Potassium 3.3 (*)    Glucose, Bld 114 (*)    Total Protein 6.3 (*)    Albumin 2.8 (*)    Total Bilirubin 1.3 (*)    GFR calc non Af Amer 54 (*)    All other components within normal limits  RESPIRATORY PANEL BY RT PCR (FLU A&B, COVID)  CBC WITH DIFFERENTIAL/PLATELET  PROTIME-INR  CBC  BASIC METABOLIC PANEL  SAMPLE TO BLOOD BANK    RADIOLOGY Images were viewed by me  Left femur x-rays Did reveal an intertrochanteric left hip fracture  IMPRESSION:  Chronic interstitial changes and hiatal hernia. No acute abnormality  noted.  ____________________________________________   DIFFERENTIAL DIAGNOSIS   Fall, contusion, fracture, dislocation, occult infection  FINAL ASSESSMENT AND PLAN  Fall, left hip fracture   Plan: The patient had presented for a fall with resulting left femur and leg pain. Patient's labs are still pending at this time. Patient's imaging did reveal an intertrochanteric left hip fracture.  I will discuss with orthopedics and the hospitalist service for admission.   Laurence Aly, MD    Note: This note was generated in part or whole with voice recognition software. Voice recognition is usually quite accurate but there are transcription errors that can and very often do occur. I apologize for any  typographical errors that were not detected and corrected.     Earleen Newport, MD 07/25/19 Si [place]    Earleen Newport, MD 07/25/19 [phone]

## 2019-07-25 NOTE — ED Notes (Signed)
No change in pain. Pt declined ice pack or other comfort measures.

## 2019-07-26 ENCOUNTER — Inpatient Hospital Stay: Payer: Medicare Other | Admitting: Anesthesiology

## 2019-07-26 ENCOUNTER — Encounter
Admission: EM | Disposition: A | Discharge status: Skilled Nursing Facility | Payer: Self-pay | Source: Home / Self Care | Attending: Internal Medicine

## 2019-07-26 ENCOUNTER — Encounter: Payer: Self-pay | Admitting: Internal Medicine

## 2019-07-26 ENCOUNTER — Inpatient Hospital Stay: Payer: Medicare Other

## 2019-07-26 HISTORY — PX: INTRAMEDULLARY (IM) NAIL INTERTROCHANTERIC: SHX5875

## 2019-07-26 LAB — TYPE AND SCREEN
ABO/RH(D): B POS
Antibody Screen: NEGATIVE

## 2019-07-26 LAB — ABO/RH: ABO/RH(D): B POS

## 2019-07-26 LAB — BASIC METABOLIC PANEL
Anion gap: 12 (ref 5–15)
BUN: 19 mg/dL (ref 8–23)
CO2: 28 mmol/L (ref 22–32)
Calcium: 9.2 mg/dL (ref 8.9–10.3)
Chloride: 102 mmol/L (ref 98–111)
Creatinine, Ser: 1.03 mg/dL — ABNORMAL HIGH (ref 0.44–1.00)
GFR calc Af Amer: 54 mL/min — ABNORMAL LOW (ref >60)
GFR calc non Af Amer: 46 mL/min — ABNORMAL LOW (ref >60)
Glucose, Bld: 136 mg/dL — ABNORMAL HIGH (ref 70–99)
Potassium: 4 mmol/L (ref 3.5–5.1)
Sodium: 142 mmol/L (ref 135–145)

## 2019-07-26 LAB — CBC
HCT: 37.4 % (ref 36.0–46.0)
Hemoglobin: 12.5 g/dL (ref 12.0–15.0)
MCH: 31.3 pg (ref 26.0–34.0)
MCHC: 33.4 g/dL (ref 30.0–36.0)
MCV: 93.5 fL (ref 80.0–100.0)
Platelets: 194 10*3/uL (ref 150–400)
RBC: 4 MIL/uL (ref 3.87–5.11)
RDW: 12 % (ref 11.5–15.5)
WBC: 15.6 10*3/uL — ABNORMAL HIGH (ref 4.0–10.5)
nRBC: 0 % (ref 0.0–0.2)

## 2019-07-26 SURGERY — FIXATION, FRACTURE, INTERTROCHANTERIC, WITH INTRAMEDULLARY ROD
Anesthesia: Monitor Anesthesia Care | Site: Hip | Laterality: Left

## 2019-07-26 MED ORDER — FENTANYL CITRATE (PF) 100 MCG/2ML IJ SOLN
INTRAMUSCULAR | Status: DC | PRN
  Administered 2019-07-26: 25 ug via INTRAVENOUS

## 2019-07-26 MED ORDER — FLEET ENEMA 7-19 GM/118ML RE ENEM: 1.0000 | ENEMA | Freq: Once | RECTAL | Status: DC | PRN

## 2019-07-26 MED ORDER — ONDANSETRON HCL 4 MG/2ML IJ SOLN: 4.0000 mg | Freq: Four times a day (QID) | INTRAMUSCULAR | Status: DC | PRN

## 2019-07-26 MED ORDER — DEXAMETHASONE SODIUM PHOSPHATE 10 MG/ML IJ SOLN
INTRAMUSCULAR | Status: AC
  Filled 2019-07-26: qty 1

## 2019-07-26 MED ORDER — CEFAZOLIN SODIUM-DEXTROSE 1-4 GM/50ML-% IV SOLN
INTRAVENOUS | Status: AC
  Administered 2019-07-27: 1 g via INTRAVENOUS
  Filled 2019-07-26: qty 50

## 2019-07-26 MED ORDER — TRAMADOL HCL 50 MG PO TABS
50.0000 mg | ORAL_TABLET | Freq: Four times a day (QID) | ORAL | Status: DC | PRN
  Administered 2019-08-03: 22:00:00 50 mg via ORAL
  Filled 2019-07-26: qty 1

## 2019-07-26 MED ORDER — ENOXAPARIN SODIUM 40 MG/0.4ML ~~LOC~~ SOLN
40.0000 mg | SUBCUTANEOUS | Status: DC
  Administered 2019-07-28 – 2019-08-06 (×10): 40 mg via SUBCUTANEOUS
  Filled 2019-07-26 (×11): qty 0.4

## 2019-07-26 MED ORDER — BUPIVACAINE HCL (PF) 0.5 % IJ SOLN
INTRAMUSCULAR | Status: AC
  Filled 2019-07-26: qty 10

## 2019-07-26 MED ORDER — ACETAMINOPHEN 500 MG PO TABS
1000.0000 mg | ORAL_TABLET | Freq: Three times a day (TID) | ORAL | Status: AC
  Administered 2019-07-26 – 2019-07-27 (×3): 1000 mg via ORAL
  Filled 2019-07-26 (×4): qty 2

## 2019-07-26 MED ORDER — OXYCODONE HCL 5 MG PO TABS: 2.5000 mg | ORAL_TABLET | ORAL | Status: DC | PRN

## 2019-07-26 MED ORDER — CEFAZOLIN SODIUM-DEXTROSE 1-4 GM/50ML-% IV SOLN
1.0000 g | Freq: Once | INTRAVENOUS | Status: AC
  Administered 2019-07-26: 16:00:00 1 g via INTRAVENOUS
  Filled 2019-07-26: qty 50

## 2019-07-26 MED ORDER — SODIUM CHLORIDE 0.9 % IV SOLN: INTRAVENOUS | Status: DC | PRN

## 2019-07-26 MED ORDER — CEFAZOLIN SODIUM-DEXTROSE 1-4 GM/50ML-% IV SOLN
1.0000 g | Freq: Four times a day (QID) | INTRAVENOUS | Status: AC
  Administered 2019-07-26 – 2019-07-27 (×2): 1 g via INTRAVENOUS
  Filled 2019-07-26 (×3): qty 50

## 2019-07-26 MED ORDER — PHENYLEPHRINE HCL (PRESSORS) 10 MG/ML IV SOLN
INTRAVENOUS | Status: DC | PRN
  Administered 2019-07-26: 100 ug via INTRAVENOUS

## 2019-07-26 MED ORDER — ONDANSETRON HCL 4 MG/2ML IJ SOLN
INTRAMUSCULAR | Status: AC
  Filled 2019-07-26: qty 2

## 2019-07-26 MED ORDER — PROPOFOL 500 MG/50ML IV EMUL
INTRAVENOUS | Status: DC | PRN
  Administered 2019-07-26: 25 ug/kg/min via INTRAVENOUS

## 2019-07-26 MED ORDER — SUCCINYLCHOLINE CHLORIDE 200 MG/10ML IV SOSY
PREFILLED_SYRINGE | INTRAVENOUS | Status: AC
  Filled 2019-07-26: qty 10

## 2019-07-26 MED ORDER — METOCLOPRAMIDE HCL 5 MG/ML IJ SOLN: 5.0000 mg | Freq: Three times a day (TID) | INTRAMUSCULAR | Status: DC | PRN

## 2019-07-26 MED ORDER — METHOCARBAMOL 500 MG PO TABS
500.0000 mg | ORAL_TABLET | Freq: Four times a day (QID) | ORAL | Status: DC | PRN
  Administered 2019-07-31: 500 mg via ORAL
  Filled 2019-07-26 (×2): qty 1

## 2019-07-26 MED ORDER — PNEUMOCOCCAL VAC POLYVALENT 25 MCG/0.5ML IJ INJ: 0.5000 mL | INJECTION | INTRAMUSCULAR | Status: DC

## 2019-07-26 MED ORDER — SODIUM CHLORIDE 0.9 % IV SOLN
INTRAVENOUS | Status: DC | PRN
  Administered 2019-07-26: 16:00:00 25 ug/min via INTRAVENOUS

## 2019-07-26 MED ORDER — BUPIVACAINE HCL (PF) 0.5 % IJ SOLN
INTRAMUSCULAR | Status: DC | PRN
  Administered 2019-07-26: 2.5 mL

## 2019-07-26 MED ORDER — FENTANYL CITRATE (PF) 100 MCG/2ML IJ SOLN
INTRAMUSCULAR | Status: AC
  Filled 2019-07-26: qty 2

## 2019-07-26 MED ORDER — METHOCARBAMOL 1000 MG/10ML IJ SOLN
500.0000 mg | Freq: Four times a day (QID) | INTRAVENOUS | Status: DC | PRN
  Filled 2019-07-26: qty 5

## 2019-07-26 MED ORDER — HYDROMORPHONE HCL 1 MG/ML IJ SOLN: 0.2500 mg | INTRAMUSCULAR | Status: DC | PRN

## 2019-07-26 MED ORDER — METOCLOPRAMIDE HCL 5 MG PO TABS: 5.0000 mg | ORAL_TABLET | Freq: Three times a day (TID) | ORAL | Status: DC | PRN

## 2019-07-26 MED ORDER — CHLORHEXIDINE GLUCONATE CLOTH 2 % EX PADS
6.0000 | MEDICATED_PAD | Freq: Every day | CUTANEOUS | Status: DC
  Administered 2019-07-26 – 2019-08-06 (×9): 6 via TOPICAL

## 2019-07-26 MED ORDER — SENNOSIDES-DOCUSATE SODIUM 8.6-50 MG PO TABS: 1.0000 | ORAL_TABLET | Freq: Every evening | ORAL | Status: DC | PRN

## 2019-07-26 MED ORDER — ONDANSETRON HCL 4 MG/2ML IJ SOLN
INTRAMUSCULAR | Status: DC | PRN
  Administered 2019-07-26: 4 mg via INTRAVENOUS

## 2019-07-26 MED ORDER — ONDANSETRON HCL 4 MG PO TABS: 4.0000 mg | ORAL_TABLET | Freq: Four times a day (QID) | ORAL | Status: DC | PRN

## 2019-07-26 MED ORDER — HALOPERIDOL LACTATE 2 MG/ML PO CONC
1.0000 mg | Freq: Three times a day (TID) | ORAL | Status: DC | PRN
  Filled 2019-07-26: qty 0.5

## 2019-07-26 MED ORDER — SODIUM CHLORIDE 0.9 % IR SOLN
Status: DC | PRN
  Administered 2019-07-26: 250 mL

## 2019-07-26 MED ORDER — PROPOFOL 10 MG/ML IV BOLUS
INTRAVENOUS | Status: DC | PRN
  Administered 2019-07-26 (×2): 10 mg via INTRAVENOUS

## 2019-07-26 MED ORDER — BUPIVACAINE LIPOSOME 1.3 % IJ SUSP
INTRAMUSCULAR | Status: DC | PRN
  Administered 2019-07-26: 17:00:00 50 mL

## 2019-07-26 MED ORDER — OXYCODONE HCL 5 MG PO TABS: 5.0000 mg | ORAL_TABLET | ORAL | Status: DC | PRN

## 2019-07-26 MED ORDER — EPHEDRINE 5 MG/ML INJ
INTRAVENOUS | Status: AC
  Filled 2019-07-26: qty 20

## 2019-07-26 MED ORDER — EPHEDRINE SULFATE 50 MG/ML IJ SOLN
INTRAMUSCULAR | Status: DC | PRN
  Administered 2019-07-26: 5 mg via INTRAVENOUS

## 2019-07-26 MED ORDER — MIDAZOLAM HCL 2 MG/2ML IJ SOLN
INTRAMUSCULAR | Status: AC
  Filled 2019-07-26: qty 2

## 2019-07-26 MED ORDER — DOCUSATE SODIUM 100 MG PO CAPS
100.0000 mg | ORAL_CAPSULE | Freq: Two times a day (BID) | ORAL | Status: DC
  Administered 2019-07-26 – 2019-08-06 (×15): 100 mg via ORAL
  Filled 2019-07-26 (×19): qty 1

## 2019-07-26 MED ORDER — BISACODYL 10 MG RE SUPP
10.0000 mg | Freq: Every day | RECTAL | Status: DC | PRN
  Administered 2019-08-02: 17:00:00 10 mg via RECTAL
  Filled 2019-07-26: qty 1

## 2019-07-26 MED ORDER — SODIUM CHLORIDE 0.9 % IV SOLN: INTRAVENOUS | Status: DC

## 2019-07-26 SURGICAL SUPPLY — 46 items
"PENCIL ELECTRO HAND CTR " (MISCELLANEOUS) ×1 IMPLANT
BIT DRILL 4.0X280 (BIT) ×2 IMPLANT
BLADE SURG 15 STRL LF DISP TIS (BLADE) ×1 IMPLANT
BLADE SURG 15 STRL SS (BLADE) ×3
CANISTER SUCT 1200ML W/VALVE (MISCELLANEOUS) ×3 IMPLANT
CHLORAPREP W/TINT 26 (MISCELLANEOUS) ×3 IMPLANT
COVER WAND RF STERILE (DRAPES) ×3 IMPLANT
DRAPE 3/4 80X56 (DRAPES) ×3 IMPLANT
DRAPE SURG 17X11 SM STRL (DRAPES) ×2 IMPLANT
DRAPE U-SHAPE 47X51 STRL (DRAPES) ×6 IMPLANT
DRSG OPSITE POSTOP 3X4 (GAUZE/BANDAGES/DRESSINGS) ×9 IMPLANT
ELECT REM PT RETURN 9FT ADLT (ELECTROSURGICAL) ×3
ELECTRODE REM PT RTRN 9FT ADLT (ELECTROSURGICAL) ×1 IMPLANT
GLOVE BIOGEL PI IND STRL 8 (GLOVE) ×1 IMPLANT
GLOVE BIOGEL PI INDICATOR 8 (GLOVE) ×2
GLOVE SURG SYN 7.5  E (GLOVE) ×3
GLOVE SURG SYN 7.5 E (GLOVE) ×1 IMPLANT
GLOVE SURG SYN 7.5 PF PI (GLOVE) ×1 IMPLANT
GOWN STRL REUS W/ TWL LRG LVL3 (GOWN DISPOSABLE) ×1 IMPLANT
GOWN STRL REUS W/ TWL XL LVL3 (GOWN DISPOSABLE) ×1 IMPLANT
GOWN STRL REUS W/TWL LRG LVL3 (GOWN DISPOSABLE) ×3
GOWN STRL REUS W/TWL XL LVL3 (GOWN DISPOSABLE) ×3
GUIDE PIN 3.2X330 (PIN) ×6
GUIDEWIRE BALL NOSE 3.0X900 (WIRE) ×2 IMPLANT
KIT PATIENT CARE HANA TABLE (KITS) ×3 IMPLANT
KIT TURNOVER KIT A (KITS) ×3 IMPLANT
MAT ABSORB  FLUID 56X50 GRAY (MISCELLANEOUS) ×6
MAT ABSORB FLUID 56X50 GRAY (MISCELLANEOUS) ×2 IMPLANT
NAIL 11X36X130D ANGLED ES (Nail) ×2 IMPLANT
NDL FILTER BLUNT 18X1 1/2 (NEEDLE) ×1 IMPLANT
NEEDLE FILTER BLUNT 18X 1/2SAF (NEEDLE) ×2
NEEDLE FILTER BLUNT 18X1 1/2 (NEEDLE) ×1 IMPLANT
NEEDLE HYPO 22GX1.5 SAFETY (NEEDLE) ×3 IMPLANT
NS IRRIG 1000ML POUR BTL (IV SOLUTION) ×3 IMPLANT
PACK HIP COMPR (MISCELLANEOUS) ×3 IMPLANT
PENCIL ELECTRO HAND CTR (MISCELLANEOUS) ×3 IMPLANT
PIN GUIDE 3.2X330 (PIN) IMPLANT
SCREW LEFTY LAG 10.5X100 (Screw) ×2 IMPLANT
SCREW LOCK CORT 5X34 (Screw) ×2 IMPLANT
STAPLER SKIN PROX 35W (STAPLE) ×3 IMPLANT
SUT VIC AB 0 CT1 36 (SUTURE) ×2 IMPLANT
SUT VIC AB 2-0 CT2 27 (SUTURE) ×3 IMPLANT
SYR 10ML LL (SYRINGE) ×3 IMPLANT
SYR 30ML LL (SYRINGE) ×3 IMPLANT
TAPE CLOTH 3X10 WHT NS LF (GAUZE/BANDAGES/DRESSINGS) ×6 IMPLANT
TOOL ACTIVATION (INSTRUMENTS) ×2 IMPLANT

## 2019-07-26 NOTE — ED Notes (Signed)
Pt repositioned in bed and explained again why she is in hospital

## 2019-07-26 NOTE — ED Notes (Signed)
Pt has been unable to rest all night. Pt has been offered pain meds and has declined. Pt agreed to a [place] so she can rest and speak with doctor this morning. Purewick applied so pt can urinate.

## 2019-07-26 NOTE — ED Notes (Signed)
Called and spoke with patient's son, Dan Humphreys [id number], to verify verbal telephone consent. Dan verified consent discussed with Dr. Posey Pronto. Consent witnessed and placed in patient's chart.

## 2019-07-26 NOTE — Transfer of Care (Signed)
Immediate Anesthesia Transfer of Care Note  Patient: DANNON DEMSKY  Procedure(s) Performed: INTRAMEDULLARY (IM) NAIL INTERTROCHANTRIC (Left Hip)  Patient Location: PACU and Nursing Unit  Anesthesia Type:Spinal  Level of Consciousness: awake and alert   Airway & Oxygen Therapy: Patient Spontanous Breathing and Patient connected to face mask oxygen  Post-op Assessment: Report given to RN and Post -op Vital signs reviewed and stable  Post vital signs: Reviewed and stable  Last Vitals:  Vitals Value Taken Time  BP 133/73 07/26/19 1809  Temp 36.3 C 07/26/19 1809  Pulse 109 07/26/19 1809  Resp 17 07/26/19 1809  SpO2 100 % 07/26/19 1809    Last Pain:  Vitals:   07/26/19 1809  TempSrc: Oral  PainSc:          Complications: No apparent anesthesia complications

## 2019-07-26 NOTE — Consult Note (Signed)
[name] [name] is a 86 y.o. female  [id number]  Primary Cardiologist: [name] [name] Reason for Consultation: Hip fracture  HPI: This is a 86 year old old pleasant white female who is well-known to me for the past 2 decades presented to the hospital with hip fracture and needs surgery thus I was asked to evaluate the patient.  Patient does have history of coronary artery disease and hypertension but has been stable.  She denies any chest pain shortness of breath   Review of Systems: No chest pain   Past Medical History:  Diagnosis Date  . Cancer ([place])    breast  . Chronic kidney disease   . Coronary artery disease   . GERD (gastroesophageal reflux disease)   . Gout   . HH (hiatus hernia)   . Hypercholesteremia   . Hypertension   . Murmur   . Palpitations     (Not in a hospital admission)      Infusions: . sodium chloride 75 mL/hr at 07/26/19 0731  .  ceFAZolin (ANCEF) IV Stopped (07/26/19 [id number])    No Known Allergies  Social History   Socioeconomic History  . Marital status: Widowed    Spouse name: Not on file  . Number of children: Not on file  . Years of education: Not on file  . Highest education level: Not on file  Occupational History  . Not on file  Tobacco Use  . Smoking status: Never Smoker  . Smokeless tobacco: Never Used  Substance and Sexual Activity  . Alcohol use: No  . Drug use: No  . Sexual activity: Not Currently  Other Topics Concern  . Not on file  Social History Narrative  . Not on file   Social Determinants of Health   Financial Resource Strain:   . Difficulty of Paying Living Expenses:   Food Insecurity:   . Worried About Charity fundraiser in the Last Year:   . Arboriculturist in the Last Year:   Transportation Needs:   . Film/video editor (Medical):   Marland Kitchen Lack of Transportation (Non-Medical):   Physical Activity:   . Days of Exercise per Week:   . Minutes of Exercise per Session:   Stress:   . Feeling of Stress  :   Social Connections:   . Frequency of Communication with Friends and Family:   . Frequency of Social Gatherings with Friends and Family:   . Attends Religious Services:   . Active Member of Clubs or Organizations:   . Attends Archivist Meetings:   Marland Kitchen Marital Status:   Intimate Partner Violence:   . Fear of Current or Ex-Partner:   . Emotionally Abused:   Marland Kitchen Physically Abused:   . Sexually Abused:     No family history on file.  PHYSICAL EXAM: Vitals:   07/26/19 0600 07/26/19 0730  BP: 127/70 (!) 147/84  Pulse: 89 (!) 107  Resp: 15 (!) 24  Temp:  97.7 F (36.5 C)  SpO2: 96% 97%    No intake or output data in the 24 hours ending 07/26/19 0925  General:  Well appearing. No respiratory difficulty HEENT: normal Neck: supple. no JVD. Carotids 2+ bilat; no bruits. No lymphadenopathy or thryomegaly appreciated. Cor: PMI nondisplaced. Regular rate & rhythm. No rubs, gallops or murmurs. Lungs: clear Abdomen: soft, nontender, nondistended. No hepatosplenomegaly. No bruits or masses. Good bowel sounds. Extremities: no cyanosis, clubbing, rash, edema Neuro: alert & oriented x 3, cranial nerves grossly intact.  moves all 4 extremities w/o difficulty. Affect pleasant.  ECG: Sinus tachycardia with nonspecific ST-T changes  Results for orders placed or performed during the hospital encounter of 07/25/19 (from the past 24 hour(s))  Sample to Blood Bank     Status: None   Collection Time: 07/25/19  6:18 PM  Result Value Ref Range   Blood Bank Specimen SAMPLE AVAILABLE FOR TESTING    Sample Expiration      07/28/2019,2359 Performed at [facility], [address] [place], [place] [address]   CBC with Differential/Platelet     Status: None   Collection Time: 07/25/19  6:18 PM  Result Value Ref Range   WBC 7.2 4.0 - 10.5 K/uL   RBC 4.26 3.87 - 5.11 MIL/uL   Hemoglobin 13.4 12.0 - 15.0 g/dL   HCT 40.1 36.0 - 46.0 %   MCV 94.1 80.0 - 100.0 fL   MCH 31.5 26.0 -  34.0 pg   MCHC 33.4 30.0 - 36.0 g/dL   RDW 12.2 11.5 - 15.5 %   Platelets 195 150 - 400 K/uL   nRBC 0.0 0.0 - 0.2 %   Neutrophils Relative % 74 %   Neutro Abs 5.2 1.7 - 7.7 K/uL   Lymphocytes Relative 15 %   Lymphs Abs 1.1 0.7 - 4.0 K/uL   Monocytes Relative 9 %   Monocytes Absolute 0.7 0.1 - 1.0 K/uL   Eosinophils Relative 1 %   Eosinophils Absolute 0.1 0.0 - 0.5 K/uL   Basophils Relative 1 %   Basophils Absolute 0.1 0.0 - 0.1 K/uL   Immature Granulocytes 0 %   Abs Immature Granulocytes 0.02 0.00 - 0.07 K/uL  Comprehensive metabolic panel     Status: Abnormal   Collection Time: 07/25/19  6:18 PM  Result Value Ref Range   Sodium 141 135 - 145 mmol/L   Potassium 3.3 (L) 3.5 - 5.1 mmol/L   Chloride 101 98 - 111 mmol/L   CO2 31 22 - 32 mmol/L   Glucose, Bld 114 (H) 70 - 99 mg/dL   BUN 16 8 - 23 mg/dL   Creatinine, Ser 0.90 0.44 - 1.00 mg/dL   Calcium 9.2 8.9 - 10.3 mg/dL   Total Protein 6.3 (L) 6.5 - 8.1 g/dL   Albumin 2.8 (L) 3.5 - 5.0 g/dL   AST 19 15 - 41 U/L   ALT 11 0 - 44 U/L   Alkaline Phosphatase 69 38 - 126 U/L   Total Bilirubin 1.3 (H) 0.3 - 1.2 mg/dL   GFR calc non Af Amer 54 (L) >60 mL/min   GFR calc Af Amer >60 >60 mL/min   Anion gap 9 5 - 15  Protime-INR     Status: None   Collection Time: 07/25/19  7:28 PM  Result Value Ref Range   Prothrombin Time 13.0 11.4 - 15.2 seconds   INR 1.0 0.8 - 1.2  Respiratory Panel by RT PCR (Flu A&B, Covid) - Nasopharyngeal Swab     Status: Abnormal   Collection Time: 07/25/19  7:28 PM   Specimen: Nasopharyngeal Swab  Result Value Ref Range   SARS Coronavirus 2 by RT PCR POSITIVE (A) NEGATIVE   Influenza A by PCR NEGATIVE NEGATIVE   Influenza B by PCR NEGATIVE NEGATIVE  CBC     Status: Abnormal   Collection Time: 07/26/19  5:05 AM  Result Value Ref Range   WBC 15.6 (H) 4.0 - 10.5 K/uL   RBC 4.00 3.87 - 5.11 MIL/uL  Hemoglobin 12.5 12.0 - 15.0 g/dL   HCT 37.4 36.0 - 46.0 %   MCV 93.5 80.0 - 100.0 fL   MCH 31.3 26.0 -  34.0 pg   MCHC 33.4 30.0 - 36.0 g/dL   RDW 12.0 11.5 - 15.5 %   Platelets 194 150 - 400 K/uL   nRBC 0.0 0.0 - 0.2 %  Basic metabolic panel     Status: Abnormal   Collection Time: 07/26/19  5:05 AM  Result Value Ref Range   Sodium 142 135 - 145 mmol/L   Potassium 4.0 3.5 - 5.1 mmol/L   Chloride 102 98 - 111 mmol/L   CO2 28 22 - 32 mmol/L   Glucose, Bld 136 (H) 70 - 99 mg/dL   BUN 19 8 - 23 mg/dL   Creatinine, Ser 1.03 (H) 0.44 - 1.00 mg/dL   Calcium 9.2 8.9 - 10.3 mg/dL   GFR calc non Af Amer 46 (L) >60 mL/min   GFR calc Af Amer 54 (L) >60 mL/min   Anion gap 12 5 - 15   DG Chest 1 View  Result Date: 07/25/2019 CLINICAL DATA:  Recent fall with known left hip fracture, initial encounter EXAM: CHEST  1 VIEW COMPARISON:  10/23/2017 FINDINGS: Cardiac shadow is within normal limits. Aortic calcifications are seen. The lungs are well aerated with mild interstitial changes stable from the previous exam. No focal infiltrate or sizable effusion is seen. Chronic hiatal hernia is noted on the left. IMPRESSION: Chronic interstitial changes and hiatal hernia. No acute abnormality noted. Electronically Signed   By: Inez Catalina M.D.   On: 07/25/2019 19:20   DG Pelvis 1-2 Views  Result Date: 07/25/2019 CLINICAL DATA:  Recent fall with left hip pain, initial encounter EXAM: PELVIS - 1 VIEW COMPARISON:  None. FINDINGS: Comminuted fracture of the proximal left femur is again noted. Pelvic ring is intact. Degenerative changes of the right hip joint are noted with some remodeling of the femoral head. Degenerative changes of lumbar spine are seen as well. IMPRESSION: Proximal left femoral fracture as described. Electronically Signed   By: Inez Catalina M.D.   On: 07/25/2019 19:23   DG Femur Min 2 Views Left  Result Date: 07/25/2019 CLINICAL DATA:  Recent fall with left leg pain, initial encounter EXAM: LEFT FEMUR 2 VIEWS COMPARISON:  None. FINDINGS: There is a comminuted fracture of the left femur in the  intratrochanteric region with impaction and angulation at the fracture site. The femoral head is well seated. The more distal femur shows no acute abnormality. Diffuse vascular calcifications are seen. No soft tissue abnormality is noted. IMPRESSION: Comminuted intratrochanteric left femoral fracture with impaction and angulation as described. Electronically Signed   By: Inez Catalina M.D.   On: 07/25/2019 19:18     ASSESSMENT AND PLAN: Hip fracture on the left side requiring hip surgery.  Patient denies any chest pain or shortness of breath and is an emergency surgery does proceed with surgery as planned.  Avangeline Stockburger A

## 2019-07-26 NOTE — Progress Notes (Signed)
Initial Nutrition Assessment  DOCUMENTATION CODES:   Not applicable  INTERVENTION:   RD will add supplements pending SLP evaluation and diet advancement   NUTRITION DIAGNOSIS:   Increased nutrient needs related to hip fracture, other (see comment)(COVID 19) as evidenced by increased estimated needs.  GOAL:   Patient will meet greater than or equal to 90% of their needs  MONITOR:   PO intake, Supplement acceptance, Labs, Weight trends, Skin, I & O's  REASON FOR ASSESSMENT:   Consult Assessment of nutrition requirement/status  ASSESSMENT:   86 y.o. female with a history of breast cancer, chronic kidney disease, GERD, hyperlipidemia, hypertension, hiatal hernia, dementia who presents with L hip fracture s/p fall and subsequently found to have COVID 19  Unable to see patient as pt remains in ED. Pt currently NPO for planned surgery today as well as SLP evaluation is pending. RD will add supplements once diet advanced. There is no weight history in chart to determine if any significant weight changes. Pt is at high risk for malnutrition. RD will re-assess patient at follow and once she is admitted to the hospital floor.   Medications reviewed and include: NaCl @75ml /hr, cefazolin  Labs reviewed: creat 1.03(H) Wbc- 15.6(H)  NUTRITION - FOCUSED PHYSICAL EXAM: Unable to complete at this time   Diet Order:   Diet Order            Diet NPO time specified  Diet effective now             EDUCATION NEEDS:   Not appropriate for education at this time  Skin:   not assessed   Last BM:  unknown  Height:   Ht Readings from Last 1 Encounters:  07/25/19 5\' 6"  (1.676 m)    Weight:   Wt Readings from Last 1 Encounters:  07/25/19 56.7 kg    Ideal Body Weight:  59 kg  BMI:  Body mass index is 20.18 kg/m.  Estimated Nutritional Needs:   Kcal:  1400-1600kcal/day  Protein:  70-80g/day  Fluid:  >1.4L/day  Knox Saliva MS, RD, LDN Please refer to AMION for RD and/or  RD on-call/weekend/after hours pager

## 2019-07-26 NOTE — Progress Notes (Signed)
Patient arrived to floor, assumed care of patient.

## 2019-07-26 NOTE — ED Notes (Signed)
Pt in hospital bed, pt able to answer some questions appropriately, but can get confused as to date. Pt to be moved to [place]

## 2019-07-26 NOTE — ED Notes (Signed)
ED TO INPATIENT HANDOFF REPORT  ED Nurse Name and Phone #: [phone]  S Name/Age/Gender [name] [name] 86 y.o. female Room/Bed: ED32A/ED32A  Code Status   Code Status: DNR  Home/SNF/Other Home Patient oriented to: self, place, time and situation Is this baseline? Yes   Triage Complete: Triage complete  Chief Complaint Closed left hip fracture, initial encounter ([place]) [S72.002A]  Triage Note Pt to the er via ems for left femur pain. Pt slid off a stool in the kitchen . Pt normally very active and uses a walker for ambulation. Over the last 56 months there has been some failure to thrive. Pt currently not taking any meds per EMS. Pt has shortening and rotation of the left leg. Pt is able to answer questions.     Allergies No Known Allergies  Level of Care/Admitting Diagnosis ED Disposition    ED Disposition Condition [place] [facility] Area: [place] [[id number]]  Level of Care: Med-Surg [16]  Covid Evaluation: Asymptomatic Screening Protocol (No Symptoms)  Diagnosis: Cl[facility] left hip fracture[id number]  Admitting Physician: Athena Masse [id number]  Attending Physician: Athena Masse [id number]  Estimated length of stay: 3 - 4 days  Certification:: I certify this patient will need inpatient services for at least 2 midnights       B Medical/Surgery History Past Medical History:  Diagnosis Date  . Cancer ([place])    breast  . Chronic kidney disease   . Coronary artery disease   . GERD (gastroesophageal reflux disease)   . Gout   . HH (hiatus hernia)   . Hypercholesteremia   . Hypertension   . Murmur   . Palpitations    Past Surgical History:  Procedure Laterality Date  . APPENDECTOMY    . cardiac stents    . CATARACT EXTRACTION W/PHACO Right 09/15/2015   Procedure: CATARACT EXTRACTION PHACO AND INTRAOCULAR LENS PLACEMENT (IOC);  Surgeon: Estill Cotta, MD;  Location: [facility] ORS;  Service: Ophthalmology;   Laterality: Right;  [place] 00:57.6 AP% 23.9 CDE 25.53 fluid pack lot # [id number]  . cornary stent    . LOBECTOMY     lung  . MASTECTOMY       A IV Location/Drains/Wounds Patient Lines/Drains/Airways Status   Active Line/Drains/Airways    Name:   Placement date:   Placement time:   Site:   Days:   Peripheral IV 07/25/19 Left Antecubital   07/25/19    1800    Antecubital   1   Incision (Closed) 09/15/15 Eye Right   09/15/15    0859     1410          Intake/Output Last 24 hours No intake or output data in the 24 hours ending 07/26/19 1402  Labs/Imaging Results for orders placed or performed during the hospital encounter of 07/25/19 (from the past 48 hour(s))  Sample to Blood Bank     Status: None   Collection Time: 07/25/19  6:18 PM  Result Value Ref Range   Blood Bank Specimen SAMPLE AVAILABLE FOR TESTING    Sample Expiration      07/28/2019,2359 Performed at [facility], [address] [place], [place] [address]   CBC with Differential/Platelet     Status: None   Collection Time: 07/25/19  6:18 PM  Result Value Ref Range   WBC 7.2 4.0 - 10.5 K/uL   RBC 4.26 3.87 - 5.11 MIL/uL   Hemoglobin 13.4 12.0 - 15.0 g/dL  HCT 40.1 36.0 - 46.0 %   MCV 94.1 80.0 - 100.0 fL   MCH 31.5 26.0 - 34.0 pg   MCHC 33.4 30.0 - 36.0 g/dL   RDW 12.2 11.5 - 15.5 %   Platelets 195 150 - 400 K/uL   nRBC 0.0 0.0 - 0.2 %   Neutrophils Relative % 74 %   Neutro Abs 5.2 1.7 - 7.7 K/uL   Lymphocytes Relative 15 %   Lymphs Abs 1.1 0.7 - 4.0 K/uL   Monocytes Relative 9 %   Monocytes Absolute 0.7 0.1 - 1.0 K/uL   Eosinophils Relative 1 %   Eosinophils Absolute 0.1 0.0 - 0.5 K/uL   Basophils Relative 1 %   Basophils Absolute 0.1 0.0 - 0.1 K/uL   Immature Granulocytes 0 %   Abs Immature Granulocytes 0.02 0.00 - 0.07 K/uL    Comment: Performed at [facility], [address] [place], [place] [address]  Comprehensive metabolic panel     Status: Abnormal   Collection Time:  07/25/19  6:18 PM  Result Value Ref Range   Sodium 141 135 - 145 mmol/L   Potassium 3.3 (L) 3.5 - 5.1 mmol/L   Chloride 101 98 - 111 mmol/L   CO2 31 22 - 32 mmol/L   Glucose, Bld 114 (H) 70 - 99 mg/dL    Comment: Glucose reference range applies only to samples taken after fasting for at least 8 hours.   BUN 16 8 - 23 mg/dL   Creatinine, Ser 0.90 0.44 - 1.00 mg/dL   Calcium 9.2 8.9 - 10.3 mg/dL   Total Protein 6.3 (L) 6.5 - 8.1 g/dL   Albumin 2.8 (L) 3.5 - 5.0 g/dL   AST 19 15 - 41 U/L   ALT 11 0 - 44 U/L   Alkaline Phosphatase 69 38 - 126 U/L   Total Bilirubin 1.3 (H) 0.3 - 1.2 mg/dL   GFR calc non Af Amer 54 (L) >60 mL/min   GFR calc Af Amer >60 >60 mL/min   Anion gap 9 5 - 15    Comment: Performed at [facility], [address] [place], [place] [address]  Type and screen [place]     Status: None   Collection Time: 07/25/19  6:18 PM  Result Value Ref Range   ABO/RH(D) B POS    Antibody Screen NEG    Sample Expiration      07/29/2019,2359 Performed at [facility], [address] [place], [place] [address]   Protime-INR     Status: None   Collection Time: 07/25/19  7:28 PM  Result Value Ref Range   Prothrombin Time 13.0 11.4 - 15.2 seconds   INR 1.0 0.8 - 1.2    Comment: (NOTE) INR goal varies based on device and disease states. Performed at [facility], [address] [place], [place] [address]   Respiratory Panel by RT PCR (Flu A&B, Covid) - Nasopharyngeal Swab     Status: Abnormal   Collection Time: 07/25/19  7:28 PM   Specimen: Nasopharyngeal Swab  Result Value Ref Range   SARS Coronavirus 2 by RT PCR POSITIVE (A) NEGATIVE    Comment: RESULT CALLED TO, READ BACK BY AND VERIFIED WITH: KATIE FERGUSON @ 2120 ON 07/25/2019 BY CAF (NOTE) SARS-CoV-2 target nucleic acids are DETECTED. SARS-CoV-2 RNA is generally detectable in upper respiratory specimens  during the acute phase of infection. Positive results are  indicative of the presence of the identified virus,  but do not rule out bacterial infection or co-infection with other pathogens not detected by the test. Clinical correlation with patient history and other diagnostic information is necessary to determine patient infection status. The expected result is Negative. Fact Sheet for Patients:  PinkCheek.be Fact Sheet for Healthcare Providers: GravelBags.it This test is not yet approved or cleared by the [place] FDA and  has been authorized for detection and/or diagnosis of SARS-CoV-2 by FDA under an Emergency Use Authorization (EUA).  This EUA will remain in effect (meaning this test can be u sed) for the duration of  the COVID-19 declaration under Section 564(b)(1) of the Act, 21 U.S.C. section 360bbb-3(b)(1), unless the authorization is terminated or revoked sooner.    Influenza A by PCR NEGATIVE NEGATIVE   Influenza B by PCR NEGATIVE NEGATIVE    Comment: (NOTE) The Xpert Xpress SARS-CoV-2/FLU/RSV assay is intended as an aid in  the diagnosis of influenza from Nasopharyngeal swab specimens and  should not be used as a sole basis for treatment. Nasal washings and  aspirates are unacceptable for Xpert Xpress SARS-CoV-2/FLU/RSV  testing. Fact Sheet for Patients: PinkCheek.be Fact Sheet for Healthcare Providers: GravelBags.it This test is not yet approved or cleared by the [place] FDA and  has been authorized for detection and/or diagnosis of SARS-CoV-2 by  FDA under an Emergency Use Authorization (EUA). This EUA will remain  in effect (meaning this test can be used) for the duration of the  Covid-19 declaration under Section 564(b)(1) of the Act, 21  U.S.C. section 360bbb-3(b)(1), unless the authorization is  terminated or revoked. Performed at [facility], [address] [place], [place]  [address]   CBC     Status: Abnormal   Collection Time: 07/26/19  5:05 AM  Result Value Ref Range   WBC 15.6 (H) 4.0 - 10.5 K/uL   RBC 4.00 3.87 - 5.11 MIL/uL   Hemoglobin 12.5 12.0 - 15.0 g/dL   HCT 37.4 36.0 - 46.0 %   MCV 93.5 80.0 - 100.0 fL   MCH 31.3 26.0 - 34.0 pg   MCHC 33.4 30.0 - 36.0 g/dL   RDW 12.0 11.5 - 15.5 %   Platelets 194 150 - 400 K/uL   nRBC 0.0 0.0 - 0.2 %    Comment: Performed at [facility], [address] [place], [place] [id number]  Basic metabolic panel     Status: Abnormal   Collection Time: 07/26/19  5:05 AM  Result Value Ref Range   Sodium 142 135 - 145 mmol/L   Potassium 4.0 3.5 - 5.1 mmol/L   Chloride 102 98 - 111 mmol/L   CO2 28 22 - 32 mmol/L   Glucose, Bld 136 (H) 70 - 99 mg/dL    Comment: Glucose reference range applies only to samples taken after fasting for at least 8 hours.   BUN 19 8 - 23 mg/dL   Creatinine, Ser 1.03 (H) 0.44 - 1.00 mg/dL   Calcium 9.2 8.9 - 10.3 mg/dL   GFR calc non Af Amer 46 (L) >60 mL/min   GFR calc Af Amer 54 (L) >60 mL/min   Anion gap 12 5 - 15    Comment: Performed at [facility], [address] [place], [place] [address]  ABO/Rh     Status: None   Collection Time: 07/26/19  5:05 AM  Result Value Ref Range   ABO/RH(D)      B POS Performed at [facility], [address],  [place], [place] [address]    DG Chest 1 View  Result Date: 07/25/2019 CLINICAL DATA:  Recent fall with known left hip fracture, initial encounter EXAM: CHEST  1 VIEW COMPARISON:  10/23/2017 FINDINGS: Cardiac shadow is within normal limits. Aortic calcifications are seen. The lungs are well aerated with mild interstitial changes stable from the previous exam. No focal infiltrate or sizable effusion is seen. Chronic hiatal hernia is noted on the left. IMPRESSION: Chronic interstitial changes and hiatal hernia. No acute abnormality noted. Electronically Signed   By: Inez Catalina M.D.   On: 07/25/2019 19:20   DG Pelvis  1-2 Views  Result Date: 07/25/2019 CLINICAL DATA:  Recent fall with left hip pain, initial encounter EXAM: PELVIS - 1 VIEW COMPARISON:  None. FINDINGS: Comminuted fracture of the proximal left femur is again noted. Pelvic ring is intact. Degenerative changes of the right hip joint are noted with some remodeling of the femoral head. Degenerative changes of lumbar spine are seen as well. IMPRESSION: Proximal left femoral fracture as described. Electronically Signed   By: Inez Catalina M.D.   On: 07/25/2019 19:23   DG Femur Min 2 Views Left  Result Date: 07/25/2019 CLINICAL DATA:  Recent fall with left leg pain, initial encounter EXAM: LEFT FEMUR 2 VIEWS COMPARISON:  None. FINDINGS: There is a comminuted fracture of the left femur in the intratrochanteric region with impaction and angulation at the fracture site. The femoral head is well seated. The more distal femur shows no acute abnormality. Diffuse vascular calcifications are seen. No soft tissue abnormality is noted. IMPRESSION: Comminuted intratrochanteric left femoral fracture with impaction and angulation as described. Electronically Signed   By: Inez Catalina M.D.   On: 07/25/2019 19:18    Pending Labs Unresulted Labs (From admission, onward)   None      Vitals/Pain Today's Vitals   07/26/19 1035 07/26/19 1122 07/26/19 1122 07/26/19 1145  BP: (!) 139/56  (!) 109/52 137/67  Pulse: (!) 104  92 95  Resp: (!) 24  15 16   Temp:      TempSrc:      SpO2: 96%  97% 97%  Weight:      Height:      PainSc:  0-No pain      Isolation Precautions Airborne and Contact precautions  Medications Medications  HYDROcodone-acetaminophen (NORCO/VICODIN) 5-325 MG per tablet 1-2 tablet (has no administration in time range)  morphine 2 MG/ML injection 0.5 mg (0.5 mg Intravenous Given 07/26/19 0506)  0.9 %  sodium chloride infusion ( Intravenous New Bag/Given 07/26/19 1042)  zolpidem (AMBIEN) tablet 5 mg (5 mg Oral Given 07/26/19 0218)  senna-docusate  (Senokot-S) tablet 1 tablet (has no administration in time range)  acetaminophen (TYLENOL) tablet 650 mg (650 mg Oral Given 07/26/19 0740)    Or  acetaminophen (TYLENOL) suppository 650 mg ( Rectal See Alternative 07/26/19 0740)  ondansetron (ZOFRAN) tablet 4 mg (has no administration in time range)    Or  ondansetron (ZOFRAN) injection 4 mg (has no administration in time range)  ceFAZolin (ANCEF) IVPB 1 g/50 mL premix (0 g Intravenous Hold 07/26/19 0723)  morphine 2 MG/ML injection 2 mg (2 mg Intravenous Given 07/25/19 1829)  morphine 2 MG/ML injection 2 mg (2 mg Intravenous Given 07/25/19 1941)    Mobility non-ambulatory High fall risk   Focused Assessments    R Recommendations: See Admitting Provider Note  Report given to:

## 2019-07-26 NOTE — Progress Notes (Signed)
PROGRESS NOTE    [name] [name]  [id number]  DOB: Sep 18, 1921  DOA: 07/25/2019 PCP: Jodi Marble, MD Outpatient Specialists:   Hospital course:  86 year old independent living female with full-time caregiver who has PMH significant for HTN, CAD and dementia with sundowning is admitted 07/25/2019 with left hip fracture status post mechanical fall.  Preop evaluation revealed positive Covid PCR.  Patient had no oxygen requirement and chest x-ray was without acute process.  Subjective:  Patient admits to feeling weak.  She feels pain if she moves her legs but is otherwise okay.  He does understand that she is in the hospital.  She notes she does walk around at home and would like to continue to walk around if possible.   Objective: Vitals:   07/26/19 1035 07/26/19 1122 07/26/19 1145 07/26/19 1442  BP: (!) 139/56 (!) 109/52 137/67 107/66  Pulse: (!) 104 92 95 80  Resp: (!) 24 15 16 16   Temp:      TempSrc:      SpO2: 96% 97% 97% 94%  Weight:      Height:       No intake or output data in the 24 hours ending 07/26/19 1522 Filed Weights   07/25/19 1827  Weight: 56.7 kg     Assessment & Plan:   86 year old independent living female with full-time caregiver and history of HTN, CAD and dementia with sundowning presents with left hip fracture after mechanical fall.  She is found to be Covid positive with negative chest x-ray no hypoxia.  Left hip fracture, proximal femur fracture Followed by orthopedics who was discussed possible surgery with patient's family. Since patient is independent living and does walk around at home they will proceed with surgery fully cognizant of multiple comorbidities of patient including advanced age and dementia. Pain management and DVT prophylaxis per orthopedics.  COVID-19 infection Chest x-ray is without acute abnormalities, patient is not hypoxic. We will not start remdesivir as patient does not really have severe disease as per  CDC/WHO guidelines. No indication for steroids given no hypoxia.  Leukocytosis Patient with leukocytosis this morning of unclear etiology. No clear evidence for acute bacterial infection. Will follow.  CAD (coronary artery disease) Does not appear to be on any CAD medications at home. Does not appear to be on any antiplatelets at home.  HTN She is normotensive, does not appear to be on any BP meds at home.  Dementia Reorder Haldol per home regimen however will make it every 8 as needed rather than twice daily standing.   DVT prophylaxis: SCD Code Status: DNR Family Communication: Dr. Posey Pronto of orthopedics spoke with son Annesha Litts. Disposition Plan:   Patient is from: Home  Anticipated Discharge Location: Rehab  Barriers to Discharge: Acute hip fracture  Is patient medically stable for Discharge: No   Consultants:  Orthopedics  Cardiology for preoperative evaluation.  Procedures:  For surgery in the morning  Antimicrobials:  None   Exam:  General: Thin pleasant female lying in bed appearing uncomfortable but denying that she was uncomfortable.  Respiratory distress. Eyes: sclera anicteric, conjuctiva mild injection bilaterally CVS: S1-S2, regular  Respiratory:  decreased air entry bilaterally secondary to decreased inspiratory effort, rales at bases  GI: Emaciated, scaphoid, positive bowel sounds.  Nontender. LE: No edema.  Neuro: A/O x 3, Moving all extremities equally with normal strength, CN 3-12 intact, grossly nonfocal.    Data Reviewed: Basic Metabolic Panel: Recent Labs  Lab 07/25/19 1818 07/26/19 0505  NA  141 142  K 3.3* 4.0  CL 101 102  CO2 31 28  GLUCOSE 114* 136*  BUN 16 19  CREATININE 0.90 1.03*  CALCIUM 9.2 9.2   Liver Function Tests: Recent Labs  Lab 07/25/19 1818  AST 19  ALT 11  ALKPHOS 69  BILITOT 1.3*  PROT 6.3*  ALBUMIN 2.8*   No results for input(s): LIPASE, AMYLASE in the last 168 hours. No results for input(s):  AMMONIA in the last 168 hours. CBC: Recent Labs  Lab 07/25/19 1818 07/26/19 0505  WBC 7.2 15.6*  NEUTROABS 5.2  --   HGB 13.4 12.5  HCT 40.1 37.4  MCV 94.1 93.5  PLT 195 194   Cardiac Enzymes: No results for input(s): CKTOTAL, CKMB, CKMBINDEX, TROPONINI in the last 168 hours. BNP (last 3 results) No results for input(s): PROBNP in the last 8760 hours. CBG: No results for input(s): GLUCAP in the last 168 hours.  Recent Results (from the past 240 hour(s))  Respiratory Panel by RT PCR (Flu A&B, Covid) - Nasopharyngeal Swab     Status: Abnormal   Collection Time: 07/25/19  7:28 PM   Specimen: Nasopharyngeal Swab  Result Value Ref Range Status   SARS Coronavirus 2 by RT PCR POSITIVE (A) NEGATIVE Final    Comment: RESULT CALLED TO, READ BACK BY AND VERIFIED WITH: KATIE FERGUSON @ 2120 ON 07/25/2019 BY CAF (NOTE) SARS-CoV-2 target nucleic acids are DETECTED. SARS-CoV-2 RNA is generally detectable in upper respiratory specimens  during the acute phase of infection. Positive results are indicative of the presence of the identified virus, but do not rule out bacterial infection or co-infection with other pathogens not detected by the test. Clinical correlation with patient history and other diagnostic information is necessary to determine patient infection status. The expected result is Negative. Fact Sheet for Patients:  PinkCheek.be Fact Sheet for Healthcare Providers: GravelBags.it This test is not yet approved or cleared by the [place] FDA and  has been authorized for detection and/or diagnosis of SARS-CoV-2 by FDA under an Emergency Use Authorization (EUA).  This EUA will remain in effect (meaning this test can be u sed) for the duration of  the COVID-19 declaration under Section 564(b)(1) of the Act, 21 U.S.C. section 360bbb-3(b)(1), unless the authorization is terminated or revoked sooner.    Influenza A by  PCR NEGATIVE NEGATIVE Final   Influenza B by PCR NEGATIVE NEGATIVE Final    Comment: (NOTE) The Xpert Xpress SARS-CoV-2/FLU/RSV assay is intended as an aid in  the diagnosis of influenza from Nasopharyngeal swab specimens and  should not be used as a sole basis for treatment. Nasal washings and  aspirates are unacceptable for Xpert Xpress SARS-CoV-2/FLU/RSV  testing. Fact Sheet for Patients: PinkCheek.be Fact Sheet for Healthcare Providers: GravelBags.it This test is not yet approved or cleared by the [place] FDA and  has been authorized for detection and/or diagnosis of SARS-CoV-2 by  FDA under an Emergency Use Authorization (EUA). This EUA will remain  in effect (meaning this test can be used) for the duration of the  Covid-19 declaration under Section 564(b)(1) of the Act, 21  U.S.C. section 360bbb-3(b)(1), unless the authorization is  terminated or revoked. Performed at [facility], [address] [place], [place] [address]       Studies: DG Chest 1 View  Result Date: 07/25/2019 CLINICAL DATA:  Recent fall with known left hip fracture, initial encounter EXAM: CHEST  1 VIEW COMPARISON:  10/23/2017 FINDINGS: Cardiac shadow is within  normal limits. Aortic calcifications are seen. The lungs are well aerated with mild interstitial changes stable from the previous exam. No focal infiltrate or sizable effusion is seen. Chronic hiatal hernia is noted on the left. IMPRESSION: Chronic interstitial changes and hiatal hernia. No acute abnormality noted. Electronically Signed   By: Inez Catalina M.D.   On: 07/25/2019 19:20   DG Pelvis 1-2 Views  Result Date: 07/25/2019 CLINICAL DATA:  Recent fall with left hip pain, initial encounter EXAM: PELVIS - 1 VIEW COMPARISON:  None. FINDINGS: Comminuted fracture of the proximal left femur is again noted. Pelvic ring is intact. Degenerative changes of the right hip joint are noted  with some remodeling of the femoral head. Degenerative changes of lumbar spine are seen as well. IMPRESSION: Proximal left femoral fracture as described. Electronically Signed   By: Inez Catalina M.D.   On: 07/25/2019 19:23   DG Femur Min 2 Views Left  Result Date: 07/25/2019 CLINICAL DATA:  Recent fall with left leg pain, initial encounter EXAM: LEFT FEMUR 2 VIEWS COMPARISON:  None. FINDINGS: There is a comminuted fracture of the left femur in the intratrochanteric region with impaction and angulation at the fracture site. The femoral head is well seated. The more distal femur shows no acute abnormality. Diffuse vascular calcifications are seen. No soft tissue abnormality is noted. IMPRESSION: Comminuted intratrochanteric left femoral fracture with impaction and angulation as described. Electronically Signed   By: Inez Catalina M.D.   On: 07/25/2019 19:18     Scheduled Meds: Continuous Infusions: . sodium chloride 75 mL/hr at 07/26/19 1042  .  ceFAZolin (ANCEF) IV Stopped (07/26/19 [id number])    Principal Problem:   Fracture, proximal femur, left, closed, initial encounter ([place]) Active Problems:   CAD (coronary artery disease)   Essential hypertension   Dementia without behavioral disturbance ([place])   Accidental fall   Preoperative clearance   Closed left hip fracture, initial encounter ([place])     Britlee Skolnik Derek Jack, [facility]  If 7PM-7AM, please contact night-coverage [email or web address] Password [email or web address] 07/26/2019, 3:22 PM    LOS: 1 day

## 2019-07-26 NOTE — Op Note (Signed)
DATE OF SURGERY: 07/26/2019  PREOPERATIVE DIAGNOSIS: Left intertrochanteric hip fracture  POSTOPERATIVE DIAGNOSIS: Left intertrochanteric hip fracture  PROCEDURE: Intramedullary nailing of L femur with cephalomedullary device  SURGEON: Cato Mulligan, MD  ANESTHESIA: spinal  EBL: 50 cc  IVF: per anesthesia record  COMPONENTS:  AOS Galileo ES Long Nail: 11x312mm; 131mm lag screw; 54mm distal cortical interlocking screw  INDICATIONS: [name] [name] is a 86 y.o. female who sustained an intertrochanteric fracture after a fall. Risks and benefits of intramedullary nailing were explained to the patient and family (who were PoA). Risks include but are not limited to bleeding, infection, injury to tissues, nerves, vessels, nonunion/malunion, hardware failure, limb length discrepancy/hip rotation mismatch and risks of anesthesia. The patient and/or family understand these risks, have completed an informed consent, and wish to proceed.   PROCEDURE:  The patient was brought into the operating room. After administering anesthesia, the patient was placed in the supine position on the Hana table. The uninjured leg was placed in an extended position while the injured lower extremity was placed in longitudinal traction. The fracture was reduced using longitudinal traction and internal rotation. The adequacy of reduction was verified fluoroscopically in AP and lateral projections and found to be acceptable. The lateral aspect of the right hip and thigh were prepped with ChloraPrep solution before being draped sterilely. Preoperative IV antibiotics were administered. A timeout was performed to verify the appropriate surgical site, patient, and procedure.    The greater trochanter was identified and an approximately 6 cm incision was made about 2 fingerbreadths above the tip of the greater trochanter. The incision was carried down through the subcutaneous tissues to expose the gluteal fascia. This was split  the length of the incision, providing access to the tip of the trochanter. Under fluoroscopic guidance, a guidewire was drilled through the tip of the trochanter into the proximal metaphysis to the level of the lesser trochanter. After verifying its position fluoroscopically in AP and lateral projections, it was overreamed with the opening reamer to the level of the lesser trochanter. A guidewire was passed down through the femoral canal to the supracondylar region. The adequacy of guidewire position was verified fluoroscopically in AP and lateral projections before the length of the guidewire within the canal was measured and a nail of appropriate length was selected. The guidewire was overreamed sequentially using the flexible reamers. The appropriate sized nail was selected and advanced to the appropriate depth as verified fluoroscopically.    The guide system for the lag screw was positioned and advanced through an approximately 3cm stab incision over the lateral aspect of the proximal femur. The guidewire was drilled up through the femoral nail and into the femoral neck to rest within 5 mm of subchondral bone. After verifying its position in the femoral neck and head in both AP and lateral projections, the guidewire was measured and appropriate sized lag screw was selected. The guidewire was overreamed to the appropriate depth before the lag screw was inserted and advanced to the appropriate depth as verified fluoroscopically in AP and lateral projections. The lag screw was advanced and the locking mechanism was deployed. Again, the adequacy of hardware position and fracture reduction was verified fluoroscopically in AP and lateral projections.   Attention was then turned to the distal interlocking screw in the diaphysis. Using a targeted assembly, a stab incision was made and hole was drilled through the nail. An interlocking screw was placed with excellent purchase. Again the adequacy of screw position  was verified fluoroscopically in AP and lateral projections.   The wounds were irrigated thoroughly with sterile saline solution. Local anesthetic was injected into the wounds. Deep fascia was closed with 0-Vicryl. The subcutaneous tissues were closed using 2-0 Vicryl interrupted sutures. The skin was closed using staples. Sterile occlusive dressings were applied to all wounds. The patient was then transferred to the recovery room in satisfactory condition.   POSTOPERATIVE PLAN: The patient will be WBAT on the operative extremity. Lovenox 40mg /day x 4 weeks to start on POD#1. Perioperative IV antibiotics x 24 hours. PT/OT on POD#1.

## 2019-07-26 NOTE — TOC Progression Note (Signed)
Transition of Care [facility]) - Progression Note    Patient Details  Name: [name] [name] MRN: [id number] Date of Birth: 1921-11-27  Transition of Care [facility]) CM/SW [place], RN Phone Number: 07/26/2019, 10:21 AM  Clinical Narrative:    Called and left voicemail with patient's son, Dan Humphreys [id number] for follow up.         Expected Discharge Plan and Services                                                 Social Determinants of Health (SDOH) Interventions    Readmission Risk Interventions No flowsheet data found.

## 2019-07-26 NOTE — Anesthesia Preprocedure Evaluation (Addendum)
Anesthesia Evaluation  Patient identified by MRN, date of birth, ID band Patient confused    Reviewed: Allergy & Precautions, H&P , NPO status , Patient's Chart, lab work & pertinent test results, reviewed documented beta blocker date and time , Unable to perform ROS - Chart review only  History of Anesthesia Complications Negative for: history of anesthetic complications  Airway Mallampati: II  TM Distance: >3 FB Neck ROM: Full    Dental no notable dental hx. (+) Teeth Intact   Pulmonary neg pulmonary ROS, neg sleep apnea, neg COPD, Patient abstained from smoking.Not current smoker,    Pulmonary exam normal breath sounds clear to auscultation       Cardiovascular Exercise Tolerance: Good METShypertension, + CAD and + Cardiac Stents  (-) Past MI (-) dysrhythmias + Valvular Problems/Murmurs  Rhythm:Regular Rate:Normal - Systolic murmurs Cardiology clearance noted.  ECHO 12/2018 NORMAL LEFT VENTRICULAR SYSTOLIC FUNCTION WITH MILD LVH  NORMAL RIGHT VENTRICULAR SYSTOLIC FUNCTION  VALVULAR REGURGITATION: MILD AR, MILD PR, MODERATE TR  VALVULAR STENOSIS: MILD MS   Neuro/Psych PSYCHIATRIC DISORDERS Dementia negative neurological ROS     GI/Hepatic hiatal hernia, neg GERD  ,(+)     (-) substance abuse  ,   Endo/Other  neg diabetes  Renal/GU CRFRenal disease     Musculoskeletal   Abdominal   Peds  Hematology   Anesthesia Other Findings Past Medical History: No date: Cancer ([place])     Comment:  breast No date: Chronic kidney disease No date: Coronary artery disease No date: GERD (gastroesophageal reflux disease) No date: Gout No date: HH (hiatus hernia) No date: Hypercholesteremia No date: Hypertension No date: Murmur No date: Palpitations  Past Surgical History: No date: APPENDECTOMY No date: cardiac stents 09/15/2015: CATARACT EXTRACTION W/PHACO; Right     Comment:  Procedure: CATARACT EXTRACTION PHACO AND  INTRAOCULAR               LENS PLACEMENT (IOC);  Surgeon: Estill Cotta, MD;                Location: [facility] ORS;  Service: Ophthalmology;  Laterality:              Right;  [place] 00:57.6 AP% 23.9 CDE 25.53 fluid pack lot #              [id number] No date: cornary stent No date: LOBECTOMY     Comment:  lung No date: MASTECTOMY  BMI    Body Mass Index: 20.18 kg/m      Reproductive/Obstetrics                          Anesthesia Physical Anesthesia Plan  ASA: III and emergent  Anesthesia Plan: General/Spinal   Post-op Pain Management:    Induction: Intravenous  PONV Risk Score and Plan: 3 and Ondansetron, Dexamethasone, Propofol infusion and TIVA  Airway Management Planned: Natural Airway  Additional Equipment: None  Intra-op Plan:   Post-operative Plan:   Informed Consent: I have reviewed the patients History and Physical, chart, labs and discussed the procedure including the risks, benefits and alternatives for the proposed anesthesia with the patient or authorized representative who has indicated his/her understanding and acceptance.       Plan Discussed with: CRNA and Surgeon  Anesthesia Plan Comments: (Discussed R/B/A of neuraxial anesthesia technique with POA: - rare risks of spinal/epidural hematoma, nerve damage, infection - Risk of PDPH - Risk of nausea and vomiting - Risk of conversion to general  anesthesia and its associated risks, including sore throat, damage to lips/teeth/oropharynx, and rare risks such as cardiac and respiratory events.  Phone consent from son Anastasio Auerbach, states has not taken any plavix for over a month  Addendum by Bertell Maria , MD: Damaris Schooner to [name] again on phone regarding patient's DNR. They agree to suspend it for perioperative period and then resume afterwards. )    Anesthesia Quick Evaluation

## 2019-07-26 NOTE — Anesthesia Procedure Notes (Addendum)
Spinal  Patient location during procedure: OR Start time: 07/26/2019 3:50 PM End time: 07/26/2019 3:54 PM Staffing Performed: resident/CRNA  Anesthesiologist: Arita Miss, MD Resident/CRNA: Jerrye Noble, CRNA Preanesthetic Checklist Completed: patient identified, IV checked, site marked, risks and benefits discussed, surgical consent, monitors and equipment checked, pre-op evaluation and timeout performed Spinal Block Patient position: right lateral decubitus Prep: ChloraPrep Patient monitoring: continuous pulse ox and blood pressure Approach: midline Location: L3-4 Injection technique: single-shot Needle Needle type: Quincke  Needle gauge: 22 G Needle length: 9 cm Additional Notes Negative paresthesia, negative heme.

## 2019-07-26 NOTE — H&P (Signed)
Paper H&P to be scanned into permanent record. H&P reviewed. No significant changes noted.  

## 2019-07-26 NOTE — TOC Initial Note (Signed)
Transition of Care [facility]) - Initial/Assessment Note    Patient Details  Name: [name] [name] MRN: [id number] Date of Birth: 02-11-1922  Transition of Care [facility]) CM/SW Contact:    Anselm Pancoast, RN Phone Number: 07/26/2019, 11:21 AM  Clinical Narrative:                 Spoke to [name], son states family is paying private for 24/7 caregiver and is hoping for ALF/Memory care placement after rehab. Family has spoken to [facility] and will plan to pay privately for placement. Patient has sun downers and is mostly demented at night.    Expected Discharge Plan: [place]     Patient Goals and CMS Choice Patient states their goals for this hospitalization and ongoing recovery are:: Get settled into ALF or memory care after rehab      Expected Discharge Plan and Services Expected Discharge Plan: [place]       Living arrangements for the past 2 months: Single Family Home                                      Prior Living Arrangements/Services Living arrangements for the past 2 months: Single Family Home Lives with:: Self(Paid 24/7 caregivers) Patient language and need for interpreter reviewed:: Yes Do you feel safe going back to the place where you live?: Yes      Need for Family Participation in Patient Care: Yes (Comment) Care giver support system in place?: Yes (comment) Current home services: DME(walker-pt refuses to use it most times) Criminal Activity/Legal Involvement Pertinent to Current Situation/Hospitalization: No - Comment as needed  Activities of Daily Living      Permission Sought/Granted Permission sought to share information with : Case Manager Permission granted to share information with : Yes, Verbal Permission Granted  Share Information with NAME: Case Manager/Social Work           Emotional Assessment Appearance:: Appears stated age Attitude/Demeanor/Rapport: Unable to Assess Affect (typically observed): Unable to  Assess Orientation: : Oriented to Self, Oriented to Place, Oriented to  Time, Oriented to Situation, Fluctuating Orientation (Suspected and/or reported Sundowners)(Sun Downers) Alcohol / Substance Use: Never Used Psych Involvement: No (comment)  Admission diagnosis:  Closed left hip fracture, initial encounter [facility]) [S72.002A] Patient Active Problem List   Diagnosis Date Noted  . Fracture, proximal femur, left, closed, initial encounter ([place]) 07/25/2019  . CAD (coronary artery disease) 07/25/2019  . Essential hypertension 07/25/2019  . Dementia without behavioral disturbance ([place]) 07/25/2019  . Accidental fall 07/25/2019  . Preoperative clearance 07/25/2019  . Closed left hip fracture, initial encounter ([place]) 07/25/2019  . Chest pain 10/23/2017   PCP:  Jodi Marble, MD Pharmacy:   [facility] [address], [place] AT [facility] [place] [address] Phone: [phone] Fax: [phone]  [id number] [place], [place] [address] [place] [address] Phone: [phone] Fax: [phone]     Social Determinants of Health (SDOH) Interventions    Readmission Risk Interventions No flowsheet data found.

## 2019-07-26 NOTE — TOC Progression Note (Signed)
Transition of Care [facility]) - Progression Note    Patient Details  Name: [name] [name] MRN: [id number] Date of Birth: 31-Mar-1922  Transition of Care [facility]) CM/SW [place], RN Phone Number: 07/26/2019, 3:47 PM  Clinical Narrative:    Incoming call from [name], son states he talked to family and they requested no referral be sent to [facility] [facility].    Expected Discharge Plan: [place]    Expected Discharge Plan and Services Expected Discharge Plan: [place] arrangements for the past 2 months: Single Family Home                                       Social Determinants of Health (SDOH) Interventions    Readmission Risk Interventions No flowsheet data found.

## 2019-07-26 NOTE — ED Notes (Signed)
No urine noted in suction canister or depends. Bladder scan done showing approx 90 ml of urine in bladder.

## 2019-07-26 NOTE — ED Notes (Signed)
Pt switched over to hospital bed and positioned for comfort. Purewick in place, peri care done. Pt awaiting hospital bed availability.

## 2019-07-26 NOTE — Consult Note (Addendum)
ORTHOPAEDIC CONSULTATION  REQUESTING PHYSICIAN: Oren Binet*  Chief Complaint:   L hip pain  History of Present Illness: [name] [name] is a 86 y.o. female who had a fall yesterday after she fell off a stool in her kitchen.  The patient noted immediate hip pain and inability to ambulate.  The patient ambulates with a walker at baseline. She lives independently, but has a 24/7 caregiver. She frequently will sundown at night.  Pain is described as sharp at its worst and a dull ache at its best.  Pain is rated a 10 out of 10 in severity.  Pain is improved with rest and immobilization.  Pain is worse with any sort of movement.  X-rays in the emergency department show a left intertrochanteric hip fracture.  Of note, she has a history of CAD with stent placement. She is on Plavix.   Past Medical History:  Diagnosis Date  . Cancer ([place])    breast  . Chronic kidney disease   . Coronary artery disease   . GERD (gastroesophageal reflux disease)   . Gout   . HH (hiatus hernia)   . Hypercholesteremia   . Hypertension   . Murmur   . Palpitations    Past Surgical History:  Procedure Laterality Date  . APPENDECTOMY    . cardiac stents    . CATARACT EXTRACTION W/PHACO Right 09/15/2015   Procedure: CATARACT EXTRACTION PHACO AND INTRAOCULAR LENS PLACEMENT (IOC);  Surgeon: Estill Cotta, MD;  Location: [facility] ORS;  Service: Ophthalmology;  Laterality: Right;  [place] 00:57.6 AP% 23.9 CDE 25.53 fluid pack lot # [id number]  . cornary stent    . LOBECTOMY     lung  . MASTECTOMY     Social History   Socioeconomic History  . Marital status: Widowed    Spouse name: Not on file  . Number of children: Not on file  . Years of education: Not on file  . Highest education level: Not on file  Occupational History  . Not on file  Tobacco Use  . Smoking status: Never Smoker  . Smokeless tobacco: Never Used  Substance and  Sexual Activity  . Alcohol use: No  . Drug use: No  . Sexual activity: Not Currently  Other Topics Concern  . Not on file  Social History Narrative  . Not on file   Social Determinants of Health   Financial Resource Strain:   . Difficulty of Paying Living Expenses:   Food Insecurity:   . Worried About Charity fundraiser in the Last Year:   . Arboriculturist in the Last Year:   Transportation Needs:   . Film/video editor (Medical):   Marland Kitchen Lack of Transportation (Non-Medical):   Physical Activity:   . Days of Exercise per Week:   . Minutes of Exercise per Session:   Stress:   . Feeling of Stress :   Social Connections:   . Frequency of Communication with Friends and Family:   . Frequency of Social Gatherings with Friends and Family:   . Attends Religious Services:   . Active Member of Clubs or Organizations:   . Attends Archivist Meetings:   Marland Kitchen Marital Status:    No family history on file. No Known Allergies Prior to Admission medications   Medication Sig Start Date End Date Taking? Authorizing Provider  haloperidol (HALDOL) 2 MG/ML solution Take 1 mg by mouth 2 (two) times daily.    Yes [provider]  LORazepam (  ATIVAN) 0.5 MG tablet Take 0.5 mg by mouth every 4 (four) hours as needed for anxiety or sleep.   Yes [provider]   Recent Labs    07/25/19 1818 07/25/19 1928 07/26/19 0505  WBC 7.2  --  15.6*  HGB 13.4  --  12.5  HCT 40.1  --  37.4  PLT 195  --  194  K 3.3*  --  4.0  CL 101  --  102  CO2 31  --  28  BUN 16  --  19  CREATININE 0.90  --  1.03*  GLUCOSE 114*  --  136*  CALCIUM 9.2  --  9.2  INR  --  1.0  --    DG Chest 1 View  Result Date: 07/25/2019 CLINICAL DATA:  Recent fall with known left hip fracture, initial encounter EXAM: CHEST  1 VIEW COMPARISON:  10/23/2017 FINDINGS: Cardiac shadow is within normal limits. Aortic calcifications are seen. The lungs are well aerated with mild interstitial changes stable from  the previous exam. No focal infiltrate or sizable effusion is seen. Chronic hiatal hernia is noted on the left. IMPRESSION: Chronic interstitial changes and hiatal hernia. No acute abnormality noted. Electronically Signed   By: Inez Catalina M.D.   On: 07/25/2019 19:20   DG Pelvis 1-2 Views  Result Date: 07/25/2019 CLINICAL DATA:  Recent fall with left hip pain, initial encounter EXAM: PELVIS - 1 VIEW COMPARISON:  None. FINDINGS: Comminuted fracture of the proximal left femur is again noted. Pelvic ring is intact. Degenerative changes of the right hip joint are noted with some remodeling of the femoral head. Degenerative changes of lumbar spine are seen as well. IMPRESSION: Proximal left femoral fracture as described. Electronically Signed   By: Inez Catalina M.D.   On: 07/25/2019 19:23   DG Femur Min 2 Views Left  Result Date: 07/25/2019 CLINICAL DATA:  Recent fall with left leg pain, initial encounter EXAM: LEFT FEMUR 2 VIEWS COMPARISON:  None. FINDINGS: There is a comminuted fracture of the left femur in the intratrochanteric region with impaction and angulation at the fracture site. The femoral head is well seated. The more distal femur shows no acute abnormality. Diffuse vascular calcifications are seen. No soft tissue abnormality is noted. IMPRESSION: Comminuted intratrochanteric left femoral fracture with impaction and angulation as described. Electronically Signed   By: Inez Catalina M.D.   On: 07/25/2019 19:18     Positive ROS: All other systems have been reviewed and were otherwise negative with the exception of those mentioned in the HPI and as above.  Physical Exam: BP 107/66 (BP Location: Right Arm)   Pulse 80   Temp 97.7 F (36.5 C) (Oral)   Resp 16   Ht 5\' 6"  (1.676 m)   Wt 56.7 kg   SpO2 94%   BMI 20.18 kg/m  General:  Alert, no acute distress Psychiatric:  Patient is NOT competent for consent, non-agitated Cardiovascular:  No pedal edema, regular rate and rhythm Respiratory:   No wheezing, non-labored breathing GI:  Abdomen is soft and non-tender Skin:  No lesions in the area of chief complaint, no erythema Neurologic:  Sensation intact distally, CN grossly intact Lymphatic:  No axillary or cervical lymphadenopathy  Orthopedic Exam:  LLE: + DF/PF/EHL SILT grossly over foot Foot wwp +Log roll/axial load   X-rays:  As above: L intertrochanteric hip fracture  Assessment/Plan: [name] [name] is a 86 y.o. female with a L intertrochanteric hip fracture   1.  I discussed the various treatment options including both surgical and non-surgical management of her fracture with the patient's family (medical PoA). We discussed the high risk of perioperative complications due to patient's age, dementia, and other co-morbidities. After discussion of risks, benefits, and alternatives to surgery, the family and patient were in agreement to proceed with surgery. The goals of surgery would be to provide adequate pain relief and allow for mobilization. Plan for surgery is R hip cephalomedullary nailing later today. 2. NPO until OR. 3. Hold anticoagulation in advance of OR   Leim Fabry   07/26/2019 2:54 PM

## 2019-07-27 LAB — COMPREHENSIVE METABOLIC PANEL
ALT: 11 U/L (ref 0–44)
AST: 16 U/L (ref 15–41)
Albumin: 2.4 g/dL — ABNORMAL LOW (ref 3.5–5.0)
Alkaline Phosphatase: 57 U/L (ref 38–126)
Anion gap: 11 (ref 5–15)
BUN: 25 mg/dL — ABNORMAL HIGH (ref 8–23)
CO2: 24 mmol/L (ref 22–32)
Calcium: 8.6 mg/dL — ABNORMAL LOW (ref 8.9–10.3)
Chloride: 108 mmol/L (ref 98–111)
Creatinine, Ser: 0.96 mg/dL (ref 0.44–1.00)
GFR calc Af Amer: 58 mL/min — ABNORMAL LOW (ref >60)
GFR calc non Af Amer: 50 mL/min — ABNORMAL LOW (ref >60)
Glucose, Bld: 116 mg/dL — ABNORMAL HIGH (ref 70–99)
Potassium: 3.8 mmol/L (ref 3.5–5.1)
Sodium: 143 mmol/L (ref 135–145)
Total Bilirubin: 1.2 mg/dL (ref 0.3–1.2)
Total Protein: 5.2 g/dL — ABNORMAL LOW (ref 6.5–8.1)

## 2019-07-27 LAB — CBC
HCT: 33 % — ABNORMAL LOW (ref 36.0–46.0)
Hemoglobin: 10.8 g/dL — ABNORMAL LOW (ref 12.0–15.0)
MCH: 31.2 pg (ref 26.0–34.0)
MCHC: 32.7 g/dL (ref 30.0–36.0)
MCV: 95.4 fL (ref 80.0–100.0)
Platelets: 145 10*3/uL — ABNORMAL LOW (ref 150–400)
RBC: 3.46 MIL/uL — ABNORMAL LOW (ref 3.87–5.11)
RDW: 12.5 % (ref 11.5–15.5)
WBC: 17.6 10*3/uL — ABNORMAL HIGH (ref 4.0–10.5)
nRBC: 0.1 % (ref 0.0–0.2)

## 2019-07-27 MED ORDER — HALOPERIDOL LACTATE 5 MG/ML IJ SOLN
2.0000 mg | Freq: Once | INTRAMUSCULAR | Status: AC
  Administered 2019-07-27: 04:00:00 2 mg via INTRAMUSCULAR
  Filled 2019-07-27: qty 1

## 2019-07-27 MED ORDER — ADULT MULTIVITAMIN W/MINERALS CH
1.0000 | ORAL_TABLET | Freq: Every day | ORAL | Status: DC
  Administered 2019-07-28 – 2019-08-06 (×9): 1 via ORAL
  Filled 2019-07-27 (×9): qty 1

## 2019-07-27 MED ORDER — ENSURE ENLIVE PO LIQD
237.0000 mL | Freq: Two times a day (BID) | ORAL | Status: DC
  Administered 2019-07-28 – 2019-08-02 (×6): 237 mL via ORAL

## 2019-07-27 NOTE — Progress Notes (Signed)
   Subjective: 1 Day Post-Op Procedure(s) (LRB): INTRAMEDULLARY (IM) NAIL INTERTROCHANTRIC (Left) Patient reports pain as mild.  Difficult historian Patient is well, and has had no acute complaints or problems Denies any CP, SOB, ABD pain. We will continue therapy today.   Objective: Vital signs in last 24 hours: Temp:  [97.4 F (36.3 C)-98.5 F (36.9 C)] 98.5 F (36.9 C) (04/02 0824) Pulse Rate:  [80-126] 97 (04/02 0824) Resp:  [15-18] 17 (04/02 0824) BP: (107-143)/(52-73) 143/59 (04/02 0824) SpO2:  [91 %-100 %] 95 % (04/02 0824)  Intake/Output from previous day: 04/01 0701 - 04/02 0700 In: 850 [I.V.:800; IV Piggyback:50] Out: 250 [Urine:200; Blood:50] Intake/Output this shift: No intake/output data recorded.  Recent Labs    07/25/19 1818 07/26/19 0505 07/27/19 0503  HGB 13.4 12.5 10.8*   Recent Labs    07/26/19 0505 07/27/19 0503  WBC 15.6* 17.6*  RBC 4.00 3.46*  HCT 37.4 33.0*  PLT 194 145*   Recent Labs    07/26/19 0505 07/27/19 0503  NA 142 143  K 4.0 3.8  CL 102 108  CO2 28 24  BUN 19 25*  CREATININE 1.03* 0.96  GLUCOSE 136* 116*  CALCIUM 9.2 8.6*   Recent Labs    07/25/19 1928  INR 1.0    EXAM General - Patient is Alert, Appropriate and Oriented Extremity - Sensation intact distally Intact pulses distally No cellulitis present Compartment soft Dressing - dressing C/D/I and no drainage Motor Function - intact, moving foot and toes well on exam.   Past Medical History:  Diagnosis Date  . Cancer ([place])    breast  . Chronic kidney disease   . Coronary artery disease   . GERD (gastroesophageal reflux disease)   . Gout   . HH (hiatus hernia)   . Hypercholesteremia   . Hypertension   . Murmur   . Palpitations     Assessment/Plan:   1 Day Post-Op Procedure(s) (LRB): INTRAMEDULLARY (IM) NAIL INTERTROCHANTRIC (Left) Principal Problem:   Fracture, proximal femur, left, closed, initial encounter ([place]) Active Problems:   CAD  (coronary artery disease)   Essential hypertension   Dementia without behavioral disturbance ([place])   Accidental fall   Preoperative clearance   Closed left hip fracture, initial encounter ([place])  Estimated body mass index is 20.18 kg/m as calculated from the following:   Height as of this encounter: 5\' 6"  (1.676 m).   Weight as of this encounter: 56.7 kg. Advance diet Up with therapy, weightbearing as tolerated left lower extremity Needs bowel movement Labs stable Vital signs stable Recheck labs in the morning   DVT Prophylaxis - Lovenox, TED hose and SCDs Weight-Bearing as tolerated to left leg   T. Rachelle Hora, PA-C [place] 07/27/2019, 10:44 AM

## 2019-07-27 NOTE — Progress Notes (Signed)
Pt is extremely agitated and combative. HR went up to 130s but was not sustained. She is equally  non compliant with taking oral /PO meds; as a result unable to give pt Po Haldol. She has pulled out 2 peripheral IVs at this point even with [place] on. NP Rufina Falco notified for further orders.

## 2019-07-27 NOTE — Anesthesia Postprocedure Evaluation (Signed)
Anesthesia Post Note  Patient: [name] [name]  Procedure(s) Performed: INTRAMEDULLARY (IM) NAIL INTERTROCHANTRIC (Left Hip)  Patient location during evaluation: Other (patient recovered in OR due to covid+. Able to move legs prior to transport ) Anesthesia Type: MAC and Spinal Level of consciousness: awake and alert Pain management: pain level controlled Vital Signs Assessment: post-procedure vital signs reviewed and stable Respiratory status: spontaneous breathing, respiratory function stable and patient connected to nasal cannula oxygen Cardiovascular status: blood pressure returned to baseline and stable Postop Assessment: no headache, no backache, no apparent nausea or vomiting and spinal receding Anesthetic complications: no     Last Vitals:  Vitals:   07/26/19 1809 07/26/19 2352  BP: 133/73 135/71  Pulse: (!) 109 (!) 126  Resp: 17 18  Temp: (!) 36.3 C (!) 36.4 C  SpO2: 100% 91%    Last Pain:  Vitals:   07/26/19 1809  TempSrc: Oral  PainSc: 0-No pain                 Arita Miss

## 2019-07-27 NOTE — Progress Notes (Signed)
PT Cancellation Note  Patient Details Name: [name] [name] MRN: [id number] DOB: 11/17/1921   Cancelled Treatment:    Reason Eval/Treat Not Completed: Fatigue/lethargy limiting ability to participate(Consult received and chart reviewed.  Per primary RN, patient had "rough night" with significant agitation.  Currently sleeping soundly.  Will allow patient to rest for now and will re-attempt evaluation in PM.)   Lanier Felty H. Owens Shark, PT, DPT, NCS 07/27/19, 11:02 AM [phone]
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
## 2019-07-27 NOTE — Evaluation (Signed)
Occupational Therapy Evaluation Patient Details Name: [name] [name] MRN: [id number] DOB: [birth year] Today's Date: 07/27/2019    History of Present Illness 86 year old independent living female with full-time caregiver who has PMH significant for HTN, CAD and dementia with sundowning is admitted 07/25/2019 with left hip fracture status post mechanical fall.  Preop evaluation revealed positive Covid PCR.  Patient had no oxygen requirement and chest x-ray was without acute process.   Clinical Impression   [name] [name] was seen for OT evaluation this date. Prior to hospital admission, pt was MOD I c RW and lived alone c 24 hour caregiver. Pt presents to acute OT demonstrating impaired ADL performance, functional cognition, and functional mobility 2/2 decreased LB access, functional strength/endurance/balance deficits, and decreased safety awareness. Pt currently requires SETUP + VCs for self-feeding and hand washing long sitting in bed - VCs for initiation and sequencing. TOTAL A don L sock at bed level. MIN A x2 + RW for sit<>stand trial at EOB - pt demonstrated L knee buckling after ~2 mins standing. Pt would benefit from skilled OT to address noted impairments and functional limitations (see below for any additional details) in order to maximize safety and independence while minimizing falls risk and caregiver burden. Upon hospital discharge, recommend STR to maximize pt safety and return to PLOF.     Follow Up Recommendations  SNF    Equipment Recommendations       Recommendations for Other Services       Precautions / Restrictions Precautions Precautions: Fall Restrictions Weight Bearing Restrictions: Yes LLE Weight Bearing: Weight bearing as tolerated      Mobility Bed Mobility Overal bed mobility: Needs Assistance Bed Mobility: Supine to Sit;Sit to Supine     Supine to sit: Mod assist;+2 for safety/equipment;HOB elevated Sit to supine: Mod assist   General bed mobility  comments: Slow initiation of movement and VCs t/o mobility for sequencing  Transfers Overall transfer level: Needs assistance Equipment used: Rolling walker (2 wheeled) Transfers: Sit to/from Stand Sit to Stand: Min assist;+2 physical assistance         General transfer comment: VCs t/o mobility for sequencing     Balance Overall balance assessment: Needs assistance Sitting-balance support: Single extremity supported;Feet supported Sitting balance-Leahy Scale: Poor   Postural control: Right lateral lean Standing balance support: Bilateral upper extremity supported Standing balance-Leahy Scale: Poor Standing balance comment: Pt demonstrated L leg buckling after stading ~2 mins at EOB                           ADL either performed or assessed with clinical judgement   ADL Overall ADL's : Needs assistance/impaired                                       General ADL Comments: SETUP + VCs for self-feeding and self-drinking seated in bed - pt demonstrated coughing with and without use of straw, VCs to take small sips. TOTAL A don L sock at bed level. SETUP for hand washing at bed level. MOD A to doff/don down seated in bed - MIN physical assist + assist for line management and sequencing     Vision         Perception     Praxis      Pertinent Vitals/Pain Pain Assessment: Faces Faces Pain Scale: Hurts little more Pain Location: L hip Pain  Descriptors / Indicators: Grimacing;Guarding Pain Intervention(s): Limited activity within patient's tolerance;RN gave pain meds during session     Hand Dominance Right   Extremity/Trunk Assessment Upper Extremity Assessment Upper Extremity Assessment: Generalized weakness(B grip 3+/5. BUE AROM shoulder flex ~90*, elbow flex WFL)   Lower Extremity Assessment Lower Extremity Assessment: LLE deficits/detail LLE: Unable to fully assess due to pain       Communication Communication Communication: No  difficulties   Cognition Arousal/Alertness: Awake/alert Behavior During Therapy: WFL for tasks assessed/performed Overall Cognitive Status: No family/caregiver present to determine baseline cognitive functioning                                 General Comments: Hx of dementia c sundowning. Pt AxO x2 (disoriented to time and situation)   General Comments  Upon arrival pt had created a hole in mitt restraint and was covered in small pellets - RN assisted to change bedding.    Exercises Exercises: Other exercises Other Exercises Other Exercises: Pt educated re: RW technique, OT role, phone and call bell orientation, importance of not pulling on lines/leads  Other Exercises: Self-feeding/drinking, don gown, don L sock, sup<>sit, sit<>stand, hand washing, bed mobility   Shoulder Instructions      Home Living Family/patient expects to be discharged to:: Skilled nursing facility Living Arrangements: Other (Comment);Alone(24 hour caregiver)                                      Prior Functioning/Environment Level of Independence: Independent with assistive device(s)        Comments: Per [facility] pt was MOD I c RW and 24 hr caregiver         OT Problem List: Decreased strength;Decreased range of motion;Impaired balance (sitting and/or standing);Decreased activity tolerance;Decreased safety awareness;Decreased knowledge of use of DME or AE      OT Treatment/Interventions: Self-care/ADL training;Therapeutic exercise;Neuromuscular education;Energy conservation;DME and/or AE instruction;Therapeutic activities;Cognitive remediation/compensation;Patient/family education;Balance training    OT Goals(Current goals can be found in the care plan section) Acute Rehab OT Goals Patient Stated Goal: To go home OT Goal Formulation: With patient Time For Goal Achievement: 08/10/19 Potential to Achieve Goals: Fair ADL Goals Pt Will Perform Upper Body Dressing: with  set-up;sitting Pt Will Perform Lower Body Dressing: with min assist;sitting/lateral leans;with caregiver independent in assisting(c LRAD & AE PRN) Pt Will Transfer to Toilet: with min assist;squat pivot transfer;bedside commode(c LRAD)  OT Frequency: Min 2X/week   Barriers to D/C: Inaccessible home environment          Co-evaluation PT/OT/SLP Co-Evaluation/Treatment: Yes Reason for Co-Treatment: Necessary to address cognition/behavior during functional activity;To address functional/ADL transfers   OT goals addressed during session: ADL's and self-care      AM-PAC OT "6 Clicks" Daily Activity     Outcome Measure Help from another person eating meals?: A Little Help from another person taking care of personal grooming?: A Little Help from another person toileting, which includes using toliet, bedpan, or urinal?: A Lot Help from another person bathing (including washing, rinsing, drying)?: A Lot Help from another person to put on and taking off regular upper body clothing?: A Little Help from another person to put on and taking off regular lower body clothing?: A Lot 6 Click Score: 15   End of Session Equipment Utilized During Treatment: Rolling walker  Activity Tolerance: Patient tolerated  treatment well Patient left: in bed;with call bell/phone within reach;with bed alarm set  OT Visit Diagnosis: Other abnormalities of gait and mobility (R26.89);Unsteadiness on feet (R26.81)                Time: [id number] OT Time Calculation (min): 47 min Charges:  OT General Charges $OT Visit: 1 Visit OT Evaluation $OT Eval Moderate Complexity: 1 Mod OT Treatments $Self Care/Home Management : 23-37 mins  Dessie Coma, M.S. OTR/L  07/27/19, 4:19 PM

## 2019-07-27 NOTE — Progress Notes (Signed)
SLP Cancellation Note  Patient Details Name: [name] [name] MRN: [id number] DOB: 05/22/1921   Cancelled treatment:       Reason Eval/Treat Not Completed: Fatigue/lethargy limiting ability to participate;Other (comment)((Consult received and chart reviewed.  Per primary RN, patie)  Leroy Sea, MS/CCC- SLP  Stein Windhorst, Susie 07/27/2019, 11:30 AM

## 2019-07-27 NOTE — Progress Notes (Signed)
OT Cancellation Note  Patient Details Name: TANESIA GLADFELTER MRN: [id number] DOB: 25-Jun-1921   Cancelled Treatment:    Reason Eval/Treat Not Completed: Fatigue/lethargy limiting ability to participate Per RN, pt is sleeping soundly, had a "rough night" - was very agitated, swinging at staff. Resting soundly right now. Will hold OT eval this AM and re-attempt in PM as available.  Dessie Coma, M.S. OTR/L  07/27/19, 11:06 AM

## 2019-07-27 NOTE — Progress Notes (Signed)
PROGRESS NOTE    [name] [name]  [id number]  DOB: Sep 29, 1921  DOA: 07/25/2019 PCP: Jodi Marble, MD Outpatient Specialists:   Hospital course:  86 year old independent living female with full-time caregiver who has PMH significant for HTN, CAD and dementia with sundowning is admitted 07/25/2019 with left hip fracture status post mechanical fall.  Preop evaluation revealed positive Covid PCR.  Patient had no oxygen requirement and chest x-ray was without acute process.  Subjective:  Patient states she is tired.  She states "why are you doing this to me".  She seemed to understand that she had broken her hip.  She said she was too tired to do anything today.   Objective: Vitals:   07/26/19 2352 07/27/19 0500 07/27/19 0824 07/27/19 1500  BP: 135/71  (!) 143/59 (!) 147/63  Pulse: (!) 126 86 97 (!) 119  Resp: 18 15 17 20   Temp: (!) 97.5 F (36.4 C)  98.5 F (36.9 C)   TempSrc:   Axillary   SpO2: 91% 96% 95% 98%  Weight:      Height:        Intake/Output Summary (Last 24 hours) at 07/27/2019 1846 Last data filed at 07/27/2019 1500 Gross per 24 hour  Intake 1292.77 ml  Output --  Net 1292.77 ml   Filed Weights   07/25/19 1827  Weight: 56.7 kg     Assessment & Plan:   86 year old independent living female with full-time caregiver and history of HTN, CAD and dementia with sundowning presents with left hip fracture after mechanical fall.  She is found to be Covid positive with negative chest x-ray no hypoxia.  Lethargy Patient looks significantly more tired today than she did yesterday, she apparently had "a rough night" is known to sundown.  Patient does also have persistent leukocytosis as well as intermittent tachycardia.  No hypoxia. We will check UA. Increase normal saline to 100 cc an hour from 75.  Intermittent tachycardia Will request EKG and place patient on telemetry to see if she is having intermittent A. fib.  Leukocytosis Leukocytosis is  persistent. Will request UA.  Left hip fracture, proximal femur fracture POD #1 status post pinning PT OT are working with patient as they are able to. Pain management and DVT prophylaxis per orthopedics.  COVID-19 infection Chest x-ray is without acute abnormalities, patient is not hypoxic. We will not start remdesivir as patient does not really have severe disease as per CDC/WHO guidelines. No indication for steroids given no hypoxia.  CAD (coronary artery disease) Does not appear to be on any CAD medications at home. Does not appear to be on any antiplatelets at home.  HTN She is normotensive, does not appear to be on any BP meds at home.  Dementia Reorder Haldol per home regimen however will make it every 8 as needed rather than twice daily standing.   DVT prophylaxis: SCD Code Status: DNR Family Communication: Dr. Posey Pronto of orthopedics spoke with son Corenthia Ellery. Disposition Plan:   Patient is from: Home  Anticipated Discharge Location: Rehab  Barriers to Discharge: Acute hip fracture  Is patient medically stable for Discharge: No   Consultants:  Orthopedics  Cardiology for preoperative evaluation.  Procedures:  For surgery in the morning  Antimicrobials:  None   Exam:  General: Thin pleasant female lying in bed appearing uncomfortable but denying that she was uncomfortable.  Respiratory distress. Eyes: sclera anicteric, conjuctiva mild injection bilaterally CVS: S1-S2, regular  Respiratory:  decreased air entry bilaterally secondary  to decreased inspiratory effort, rales at bases  GI: Emaciated, scaphoid, positive bowel sounds.  Nontender. LE: No edema.  Neuro: A/O x 3, Moving all extremities equally with normal strength, CN 3-12 intact, grossly nonfocal.    Data Reviewed: Basic Metabolic Panel: Recent Labs  Lab 07/25/19 1818 07/26/19 0505 07/27/19 0503  NA 141 142 143  K 3.3* 4.0 3.8  CL 101 102 108  CO2 31 28 24   GLUCOSE 114* 136* 116*    BUN 16 19 25*  CREATININE 0.90 1.03* 0.96  CALCIUM 9.2 9.2 8.6*   Liver Function Tests: Recent Labs  Lab 07/25/19 1818 07/27/19 0503  AST 19 16  ALT 11 11  ALKPHOS 69 57  BILITOT 1.3* 1.2  PROT 6.3* 5.2*  ALBUMIN 2.8* 2.4*   No results for input(s): LIPASE, AMYLASE in the last 168 hours. No results for input(s): AMMONIA in the last 168 hours. CBC: Recent Labs  Lab 07/25/19 1818 07/26/19 0505 07/27/19 0503  WBC 7.2 15.6* 17.6*  NEUTROABS 5.2  --   --   HGB 13.4 12.5 10.8*  HCT 40.1 37.4 33.0*  MCV 94.1 93.5 95.4  PLT 195 194 145*   Cardiac Enzymes: No results for input(s): CKTOTAL, CKMB, CKMBINDEX, TROPONINI in the last 168 hours. BNP (last 3 results) No results for input(s): PROBNP in the last 8760 hours. CBG: No results for input(s): GLUCAP in the last 168 hours.  Recent Results (from the past 240 hour(s))  Respiratory Panel by RT PCR (Flu A&B, Covid) - Nasopharyngeal Swab     Status: Abnormal   Collection Time: 07/25/19  7:28 PM   Specimen: Nasopharyngeal Swab  Result Value Ref Range Status   SARS Coronavirus 2 by RT PCR POSITIVE (A) NEGATIVE Final    Comment: RESULT CALLED TO, READ BACK BY AND VERIFIED WITH: KATIE FERGUSON @ 2120 ON 07/25/2019 BY CAF (NOTE) SARS-CoV-2 target nucleic acids are DETECTED. SARS-CoV-2 RNA is generally detectable in upper respiratory specimens  during the acute phase of infection. Positive results are indicative of the presence of the identified virus, but do not rule out bacterial infection or co-infection with other pathogens not detected by the test. Clinical correlation with patient history and other diagnostic information is necessary to determine patient infection status. The expected result is Negative. Fact Sheet for Patients:  PinkCheek.be Fact Sheet for Healthcare Providers: GravelBags.it This test is not yet approved or cleared by the [place] FDA and   has been authorized for detection and/or diagnosis of SARS-CoV-2 by FDA under an Emergency Use Authorization (EUA).  This EUA will remain in effect (meaning this test can be u sed) for the duration of  the COVID-19 declaration under Section 564(b)(1) of the Act, 21 U.S.C. section 360bbb-3(b)(1), unless the authorization is terminated or revoked sooner.    Influenza A by PCR NEGATIVE NEGATIVE Final   Influenza B by PCR NEGATIVE NEGATIVE Final    Comment: (NOTE) The Xpert Xpress SARS-CoV-2/FLU/RSV assay is intended as an aid in  the diagnosis of influenza from Nasopharyngeal swab specimens and  should not be used as a sole basis for treatment. Nasal washings and  aspirates are unacceptable for Xpert Xpress SARS-CoV-2/FLU/RSV  testing. Fact Sheet for Patients: PinkCheek.be Fact Sheet for Healthcare Providers: GravelBags.it This test is not yet approved or cleared by the [place] FDA and  has been authorized for detection and/or diagnosis of SARS-CoV-2 by  FDA under an Emergency Use Authorization (EUA). This EUA will remain  in effect (meaning this  test can be used) for the duration of the  Covid-19 declaration under Section 564(b)(1) of the Act, 21  U.S.C. section 360bbb-3(b)(1), unless the authorization is  terminated or revoked. Performed at [facility], [address] [place], [place] [address]       Studies: DG Chest 1 View  Result Date: 07/25/2019 CLINICAL DATA:  Recent fall with known left hip fracture, initial encounter EXAM: CHEST  1 VIEW COMPARISON:  10/23/2017 FINDINGS: Cardiac shadow is within normal limits. Aortic calcifications are seen. The lungs are well aerated with mild interstitial changes stable from the previous exam. No focal infiltrate or sizable effusion is seen. Chronic hiatal hernia is noted on the left. IMPRESSION: Chronic interstitial changes and hiatal hernia. No acute abnormality  noted. Electronically Signed   By: Inez Catalina M.D.   On: 07/25/2019 19:20   DG Pelvis 1-2 Views  Result Date: 07/25/2019 CLINICAL DATA:  Recent fall with left hip pain, initial encounter EXAM: PELVIS - 1 VIEW COMPARISON:  None. FINDINGS: Comminuted fracture of the proximal left femur is again noted. Pelvic ring is intact. Degenerative changes of the right hip joint are noted with some remodeling of the femoral head. Degenerative changes of lumbar spine are seen as well. IMPRESSION: Proximal left femoral fracture as described. Electronically Signed   By: Inez Catalina M.D.   On: 07/25/2019 19:23   DG HIP OPERATIVE UNILAT W OR W/O PELVIS LEFT  Result Date: 07/26/2019 CLINICAL DATA:  Left IM nail EXAM: OPERATIVE left HIP (WITH PELVIS IF PERFORMED) 4 VIEWS TECHNIQUE: Fluoroscopic spot image(s) were submitted for interpretation post-operatively. COMPARISON:  07/25/2019 FINDINGS: Four low resolution intraoperative spot views of the left hip. Images demonstrate internal fixation of left intertrochanteric fracture. Total fluoroscopy time was 53 seconds IMPRESSION: Intraoperative fluoroscopic assistance provided during surgical fixation of left hip fracture Electronically Signed   By: Donavan Foil M.D.   On: 07/26/2019 20:34   DG Femur Min 2 Views Left  Result Date: 07/25/2019 CLINICAL DATA:  Recent fall with left leg pain, initial encounter EXAM: LEFT FEMUR 2 VIEWS COMPARISON:  None. FINDINGS: There is a comminuted fracture of the left femur in the intratrochanteric region with impaction and angulation at the fracture site. The femoral head is well seated. The more distal femur shows no acute abnormality. Diffuse vascular calcifications are seen. No soft tissue abnormality is noted. IMPRESSION: Comminuted intratrochanteric left femoral fracture with impaction and angulation as described. Electronically Signed   By: Inez Catalina M.D.   On: 07/25/2019 19:18     Scheduled Meds: . acetaminophen  1,000 mg Oral  Q8H  . Chlorhexidine Gluconate Cloth  6 each Topical Daily  . docusate sodium  100 mg Oral BID  . enoxaparin (LOVENOX) injection  40 mg Subcutaneous Q24H  . [START ON 07/28/2019] feeding supplement (ENSURE ENLIVE)  237 mL Oral BID BM  . [START ON 07/28/2019] multivitamin with minerals  1 tablet Oral Daily  . pneumococcal 23 valent vaccine  0.5 mL Intramuscular Tomorrow-1000   Continuous Infusions: . sodium chloride 75 mL/hr at 07/27/19 1500  . methocarbamol (ROBAXIN) IV      Principal Problem:   Fracture, proximal femur, left, closed, initial encounter ([place]) Active Problems:   CAD (coronary artery disease)   Essential hypertension   Dementia without behavioral disturbance ([place])   Accidental fall   Preoperative clearance   Closed left hip fracture, initial encounter ([place])     Brevin Mcfadden Derek Jack, [facility]  If 7PM-7AM, please contact night-coverage  [email or web address] Password [facility] 07/27/2019, 6:46 PM    LOS: 2 days

## 2019-07-27 NOTE — Progress Notes (Signed)
SUBJECTIVE: Patient was agitated this morning   Vitals:   07/26/19 1809 07/26/19 2352 07/27/19 0500 07/27/19 0824  BP: 133/73 135/71  (!) 143/59  Pulse: (!) 109 (!) 126 86 97  Resp: 17 18 15 17   Temp: (!) 97.4 F (36.3 C) (!) 97.5 F (36.4 C)  98.5 F (36.9 C)  TempSrc: Oral   Axillary  SpO2: 100% 91% 96% 95%  Weight:      Height:        Intake/Output Summary (Last 24 hours) at 07/27/2019 0847 Last data filed at 07/26/2019 1811 Gross per 24 hour  Intake 850 ml  Output 250 ml  Net 600 ml    LABS: Basic Metabolic Panel: Recent Labs    07/26/19 0505 07/27/19 0503  NA 142 143  K 4.0 3.8  CL 102 108  CO2 28 24  GLUCOSE 136* 116*  BUN 19 25*  CREATININE 1.03* 0.96  CALCIUM 9.2 8.6*   Liver Function Tests: Recent Labs    07/25/19 1818 07/27/19 0503  AST 19 16  ALT 11 11  ALKPHOS 69 57  BILITOT 1.3* 1.2  PROT 6.3* 5.2*  ALBUMIN 2.8* 2.4*   No results for input(s): LIPASE, AMYLASE in the last 72 hours. CBC: Recent Labs    07/25/19 1818 07/25/19 1818 07/26/19 0505 07/27/19 0503  WBC 7.2   < > 15.6* 17.6*  NEUTROABS 5.2  --   --   --   HGB 13.4   < > 12.5 10.8*  HCT 40.1   < > 37.4 33.0*  MCV 94.1   < > 93.5 95.4  PLT 195   < > 194 145*   < > = values in this interval not displayed.   Cardiac Enzymes: No results for input(s): CKTOTAL, CKMB, CKMBINDEX, TROPONINI in the last 72 hours. BNP: Invalid input(s): POCBNP D-Dimer: No results for input(s): DDIMER in the last 72 hours. Hemoglobin A1C: No results for input(s): HGBA1C in the last 72 hours. Fasting Lipid Panel: No results for input(s): CHOL, HDL, LDLCALC, TRIG, CHOLHDL, LDLDIRECT in the last 72 hours. Thyroid Function Tests: No results for input(s): TSH, T4TOTAL, T3FREE, THYROIDAB in the last 72 hours.  Invalid input(s): FREET3 Anemia Panel: No results for input(s): VITAMINB12, FOLATE, FERRITIN, TIBC, IRON, RETICCTPCT in the last 72 hours.   PHYSICAL EXAM General: Well developed, well  nourished, in no acute distress HEENT:  Normocephalic and atramatic Neck:  No JVD.  Lungs: Clear bilaterally to auscultation and percussion. Heart: HRRR . Normal S1 and S2 without gallops or murmurs.  Abdomen: Bowel sounds are positive, abdomen soft and non-tender  Msk:  Back normal, normal gait. Normal strength and tone for age. Extremities: No clubbing, cyanosis or edema.   Neuro: Alert and oriented X 3. Psych:  Good affect, responds appropriately  TELEMETRY: Occasional sinus tachycardia  ASSESSMENT AND PLAN: Status post hip surgery with occasional agitation overall doing fine.  Principal Problem:   Fracture, proximal femur, left, closed, initial encounter ([place]) Active Problems:   CAD (coronary artery disease)   Essential hypertension   Dementia without behavioral disturbance ([place])   Accidental fall   Preoperative clearance   Closed left hip fracture, initial encounter ([facility])    [name] Laming A, MD, [facility] 07/27/2019 8:47 AM

## 2019-07-28 LAB — BASIC METABOLIC PANEL
Anion gap: 3 — ABNORMAL LOW (ref 5–15)
BUN: 27 mg/dL — ABNORMAL HIGH (ref 8–23)
CO2: 29 mmol/L (ref 22–32)
Calcium: 8.3 mg/dL — ABNORMAL LOW (ref 8.9–10.3)
Chloride: 109 mmol/L (ref 98–111)
Creatinine, Ser: 0.93 mg/dL (ref 0.44–1.00)
GFR calc Af Amer: >60 mL/min (ref >60)
GFR calc non Af Amer: 52 mL/min — ABNORMAL LOW (ref >60)
Glucose, Bld: 92 mg/dL (ref 70–99)
Potassium: 3.6 mmol/L (ref 3.5–5.1)
Sodium: 141 mmol/L (ref 135–145)

## 2019-07-28 LAB — CBC
HCT: 26.4 % — ABNORMAL LOW (ref 36.0–46.0)
Hemoglobin: 9 g/dL — ABNORMAL LOW (ref 12.0–15.0)
MCH: 31.5 pg (ref 26.0–34.0)
MCHC: 34.1 g/dL (ref 30.0–36.0)
MCV: 92.3 fL (ref 80.0–100.0)
Platelets: 144 10*3/uL — ABNORMAL LOW (ref 150–400)
RBC: 2.86 MIL/uL — ABNORMAL LOW (ref 3.87–5.11)
RDW: 12.6 % (ref 11.5–15.5)
WBC: 14.8 10*3/uL — ABNORMAL HIGH (ref 4.0–10.5)
nRBC: 0 % (ref 0.0–0.2)

## 2019-07-28 MED ORDER — SODIUM CHLORIDE 0.9 % IV BOLUS
250.0000 mL | Freq: Once | INTRAVENOUS | Status: AC
  Administered 2019-07-28: 21:00:00 250 mL via INTRAVENOUS

## 2019-07-28 MED ORDER — ENOXAPARIN SODIUM 40 MG/0.4ML ~~LOC~~ SOLN: 40.0000 mg | SUBCUTANEOUS | 0 refills | Status: AC

## 2019-07-28 MED ORDER — ASPIRIN EC 81 MG PO TBEC
81.0000 mg | DELAYED_RELEASE_TABLET | Freq: Every day | ORAL | Status: DC
  Administered 2019-07-29 – 2019-08-06 (×8): 81 mg via ORAL
  Filled 2019-07-28 (×9): qty 1

## 2019-07-28 MED ORDER — ENALAPRIL MALEATE 2.5 MG PO TABS
2.5000 mg | ORAL_TABLET | Freq: Every day | ORAL | Status: DC
  Administered 2019-07-28 – 2019-08-06 (×9): 2.5 mg via ORAL
  Filled 2019-07-28 (×12): qty 1

## 2019-07-28 MED ORDER — ASPIRIN EC 81 MG PO TBEC
81.0000 mg | DELAYED_RELEASE_TABLET | Freq: Every day | ORAL | Status: DC
  Administered 2019-07-28: 12:00:00 81 mg via ORAL

## 2019-07-28 MED ORDER — METOPROLOL TARTRATE 5 MG/5ML IV SOLN
INTRAVENOUS | Status: AC
  Filled 2019-07-28: qty 25

## 2019-07-28 MED ORDER — ASPIRIN 81 MG PO CHEW
CHEWABLE_TABLET | ORAL | Status: AC
  Administered 2019-07-28: 81 mg
  Filled 2019-07-28: qty 1

## 2019-07-28 MED ORDER — METOPROLOL SUCCINATE ER 25 MG PO TB24
25.0000 mg | ORAL_TABLET | Freq: Every day | ORAL | Status: DC
  Administered 2019-07-28 – 2019-08-06 (×9): 25 mg via ORAL
  Filled 2019-07-28 (×10): qty 1

## 2019-07-28 NOTE — Evaluation (Signed)
Clinical/Bedside Swallow Evaluation Patient Details  Name: [name] [name] MRN: [id number] Date of Birth: 16-Oct-1921  Today's Date: 07/28/2019 Time: SLP Start Time (ACUTE ONLY): 0840 SLP Stop Time (ACUTE ONLY): 0940 SLP Time Calculation (min) (ACUTE ONLY): 60 min  Past Medical History:  Past Medical History:  Diagnosis Date  . Cancer ([place])    breast  . Chronic kidney disease   . Coronary artery disease   . GERD (gastroesophageal reflux disease)   . Gout   . HH (hiatus hernia)   . Hypercholesteremia   . Hypertension   . Murmur   . Palpitations    Past Surgical History:  Past Surgical History:  Procedure Laterality Date  . APPENDECTOMY    . cardiac stents    . CATARACT EXTRACTION W/PHACO Right 09/15/2015   Procedure: CATARACT EXTRACTION PHACO AND INTRAOCULAR LENS PLACEMENT (IOC);  Surgeon: Estill Cotta, MD;  Location: [facility] ORS;  Service: Ophthalmology;  Laterality: Right;  [place] 00:57.6 AP% 23.9 CDE 25.53 fluid pack lot # [id number]  . cornary stent    . LOBECTOMY     lung  . MASTECTOMY     HPI:  Pt is a 86 y.o. female with medical history significant for Dementia, with sundowning, coronary artery disease status post LAD stent, followed by Dr. Humphrey Rolls, hyperlipidemia, Hiatal Hernia, GERD, and hypertension who was brought in for evaluation after she suffered what appears to be a mechanical fall.  Patient attempted to sit down and missed and fell onto her left hip and was unable to bear weight thereafter.  She had no loss of consciousness did not hit her head.  Chest x-ray showed no acute findings.  Urinalysis clear.  Tested positive for Covid after admission.  Pt is post op Day 2 of INTRAMEDULLARY (IM) NAIL INTERTROCHANTRIC (Left).  Pt is extremely agitated and combative but calmer and participated well w/ this SLP on day of this evaluation.    Assessment / Plan / Recommendation Clinical Impression  Pt appears to present w/ Mild-Mod oropharyngeal phase dysphagia in the setting of  Baseline Cognitive decline/Dementia, weakness/confusion, and recent traumatic fall w/ hip fx. Mild+ overt, clinical s/s of aspiration noted during oral intake/po trials this morning. Pt was awake/alert and calm to verbally engage w/ SLP then help hold Cup to feed self; setup and cues given intermittently to maintain attention w/ tasks. Pt followed instructions w/ cues. Pt needed full positioning support to sit upright for oral intake. Pt consumed trials of thin(via Cup) and Nectar liquids, purees and mech soft/solid food trials. Inconsistent, overt s/s of aspiration noted w/ trials of thin liquids -- delayed coughing, throat clearing. No overt s/s of aspiration noted w/ trials of Nectar liquids via Cup/straw nor immediately w/ food trials; no decline in vocal quality or respiratory status during/post trials. Oral phase appeared grossly [facility] for bolus management of liquids and purees and well-broken down soft solids; A-P transfer was timely and oral clearing achieved post swallows. Min+ increased time for full mastication w/ increased texture solids(meats) d/t Missing Dentition for mastication. OM exam appeared [facility] w/ no unilateral weakness noted. Recommend a mech soft diet (w/ Minced meats, gravy to moisten for ease of chewing) w/ Nectar liquids. Recommend aspiration precautions; pills in a puree for safer swallowing than w/ liquids. Tray setup at meals; assist and monitor at meals d/t weakness and Cognitive decline/status. ST services will continue to f/u w/ toleration of diet and trials to upgrade diet consistency as pt's Cognitive status continues to improve.  SLP Visit  Diagnosis: Dysphagia, oropharyngeal phase (R13.12)(Cognitive decline baseline)    Aspiration Risk  Mild aspiration risk;Risk for inadequate nutrition/hydration    Diet Recommendation  Dysphagia level 3 w/ MINCED meats, gravies; NECTAR consistency liquids. Aspiration precautions. GERD precautions. Support at all meals w/ setup and feeding d/t  Cognitive decline, weakness.  Medication Administration: Whole meds with puree(for safer swallowing)    Other  Recommendations Recommended Consults: (Dietician f/u for support; Palliative Care for [place]) Oral Care Recommendations: Oral care BID;Oral care before and after PO;Staff/trained caregiver to provide oral care Other Recommendations: Order thickener from pharmacy;Prohibited food (jello, ice cream, thin soups);Remove water pitcher;Have oral suction available   Follow up Recommendations Skilled Nursing facility      Frequency and Duration min 3x week  2 weeks       Prognosis Prognosis for Safe Diet Advancement: Fair Barriers to Reach Goals: Cognitive deficits;Time post onset;Severity of deficits      Swallow Study   General Date of Onset: 07/25/19 HPI: Pt is a 86 y.o. female with medical history significant for Dementia, with sundowning, coronary artery disease status post LAD stent, followed by Dr. Humphrey Rolls, hyperlipidemia, Hiatal Hernia, GERD, and hypertension who was brought in for evaluation after she suffered what appears to be a mechanical fall.  Patient attempted to sit down and missed and fell onto her left hip and was unable to bear weight thereafter.  She had no loss of consciousness did not hit her head.  Chest x-ray showed no acute findings.  Urinalysis clear.  Tested positive for Covid after admission.  Pt is post op Day 2 of INTRAMEDULLARY (IM) NAIL INTERTROCHANTRIC (Left).  Pt is extremely agitated and combative but calmer and participated well w/ this SLP on day of this evaluation.  Type of Study: Bedside Swallow Evaluation Previous Swallow Assessment: none Diet Prior to this Study: Regular;Thin liquids Temperature Spikes Noted: No(wbc 14.8 declining) Respiratory Status: Room air History of Recent Intubation: No Behavior/Cognition: Alert;Cooperative;Pleasant mood;Confused;Distractible;Requires cueing Oral Cavity Assessment: Dry;Dried secretions(min+) Oral Care Completed  by SLP: Yes Oral Cavity - Dentition: Adequate natural dentition;Missing dentition Vision: Functional for self-feeding Self-Feeding Abilities: Able to feed self;Needs assist;Needs set up;Total assist(often did not attempt) Patient Positioning: Upright in bed(needed full positioning support) Baseline Vocal Quality: Low vocal intensity(adequate) Volitional Cough: Strong(adequate) Volitional Swallow: Able to elicit    Oral/Motor/Sensory Function Overall Oral Motor/Sensory Function: Within functional limits   Ice Chips Ice chips: Within functional limits Presentation: Spoon(fed; 3 trials) Other Comments: masticated small pieces   Thin Liquid Thin Liquid: Impaired Presentation: Cup;Self Fed(8 trials) Oral Phase Impairments: (none) Oral Phase Functional Implications: (min disorganized) Pharyngeal  Phase Impairments: Throat Clearing - Delayed(x3)    Nectar Thick Nectar Thick Liquid: Within functional limits Presentation: Cup;Self Fed;Straw(4 trials via each)   Honey Thick Honey Thick Liquid: Not tested   Puree Puree: Within functional limits Presentation: Spoon(fed; 8 trials)   Solid     Solid: Impaired Presentation: Spoon(fed; 10 trials ) Oral Phase Impairments: Impaired mastication(missing dentition) Oral Phase Functional Implications: Impaired mastication;Prolonged oral transit(missing dentition) Pharyngeal Phase Impairments: (none)       [name], MS, CCC-SLP Emilyn Ruble 07/28/2019,11:35 AM

## 2019-07-28 NOTE — Progress Notes (Signed)
PROGRESS NOTE    [name] [name]  [id number]  DOB: [birth year]  DOA: 07/25/2019 PCP: Jodi Marble, MD Outpatient Specialists:   Hospital course:  86 year old independent living female with full-time caregiver who has PMH significant for HTN, CAD and dementia with sundowning is admitted 07/25/2019 with left hip fracture status post mechanical fall.  Preop evaluation revealed positive Covid PCR.  Patient had no oxygen requirement and chest x-ray was without acute process.  Subjective:  Patient appears much more alert today however when I asked her if she knows what is going on she shakes her head no.  I explained that she broke her hip and she asks me which hip was broken.  She denies feeling short of breath.  She admits to feeling very tired.  She states she is not hungry but thinks she ate well this morning.  Objective: Vitals:   07/27/19 0824 07/27/19 1500 07/28/19 0957 07/28/19 1550  BP: (!) 143/59 (!) 147/63 (!) 137/51 137/62  Pulse: 97 (!) 119    Resp: 17 20    Temp: 98.5 F (36.9 C)  98.2 F (36.8 C) (!) 97.1 F (36.2 C)  TempSrc: Axillary  Oral Oral  SpO2: 95% 98%    Weight:      Height:        Intake/Output Summary (Last 24 hours) at 07/28/2019 1809 Last data filed at 07/28/2019 1700 Gross per 24 hour  Intake --  Output 300 ml  Net -300 ml   Filed Weights   07/25/19 1827  Weight: 56.7 kg     Assessment & Plan:   86 year old independent living female with full-time caregiver and history of HTN, CAD and dementia with sundowning presents with left hip fracture after mechanical fall.  She is found to be Covid positive with negative chest x-ray no hypoxia.  Lethargy Patient definitely looks better this morning than she did yesterday.  She is less confused and is sitting up and had a good appetite from what I could see of what she ate from her breakfast. She is speaking with me coherently although her memory is clearly diminished.  Leukocytosis Is  persistent since admission. Patient remains afebrile UA is negative Patient has no oxygen requirement and chest x-ray remains negative Abdomen is soft  Anemia Patient had a progressive decline in her H&H since her surgery. Unclear what estimated blood loss was during surgery. Patient remains hemodynamically stable. We will continue to follow.  Intermittent tachycardia Improved overnight, possibly secondary to IV fluid increase. Continue normal saline at 100 cc an hour. As noted above, tachycardia is possibly also secondary to her developing anemia EKG today shows sinus rhythm without PVCs or A. Fib. She has some J-point elevation.  Left hip fracture, proximal femur fracture POD #2 status post pinning PT OT are working with patient as they are able to. Pain management and DVT prophylaxis per orthopedics.  COVID-19 infection Chest x-ray is without acute abnormalities, patient is not hypoxic. We will not start remdesivir as patient does not really have severe disease as per CDC/WHO guidelines. No indication for steroids given no hypoxia.  CAD (coronary artery disease) Does not appear to be on any CAD medications at home. Does not appear to be on any antiplatelets at home.  HTN She is normotensive, does not appear to be on any BP meds at home.  Dementia Reorder Haldol per home regimen however will make it every 8 as needed rather than twice daily standing.   DVT prophylaxis: SCD  Code Status: DNR Family Communication: Dr. Posey Pronto of orthopedics spoke with son Reilee Hirose. Disposition Plan:   Patient is from: Home  Anticipated Discharge Location: Rehab  Barriers to Discharge: Acute hip fracture  Is patient medically stable for Discharge: No   Consultants:  Orthopedics  Cardiology for preoperative evaluation.  Procedures:  For surgery in the morning  Antimicrobials:  None   Exam:  General: Thin pleasant female lying in bed appearing uncomfortable but  denying that she was uncomfortable.  Respiratory distress. Eyes: sclera anicteric, conjuctiva mild injection bilaterally CVS: S1-S2, regular  Respiratory:  decreased air entry bilaterally secondary to decreased inspiratory effort, rales at bases  GI: Emaciated, scaphoid, positive bowel sounds.  Nontender. LE: No edema.  Neuro: A/O x 3, Moving all extremities equally with normal strength, CN 3-12 intact, grossly nonfocal.    Data Reviewed: Basic Metabolic Panel: Recent Labs  Lab 07/25/19 1818 07/26/19 0505 07/27/19 0503 07/28/19 0507  NA 141 142 143 141  K 3.3* 4.0 3.8 3.6  CL 101 102 108 109  CO2 31 28 24 29   GLUCOSE 114* 136* 116* 92  BUN 16 19 25* 27*  CREATININE 0.90 1.03* 0.96 0.93  CALCIUM 9.2 9.2 8.6* 8.3*   Liver Function Tests: Recent Labs  Lab 07/25/19 1818 07/27/19 0503  AST 19 16  ALT 11 11  ALKPHOS 69 57  BILITOT 1.3* 1.2  PROT 6.3* 5.2*  ALBUMIN 2.8* 2.4*   No results for input(s): LIPASE, AMYLASE in the last 168 hours. No results for input(s): AMMONIA in the last 168 hours. CBC: Recent Labs  Lab 07/25/19 1818 07/26/19 0505 07/27/19 0503 07/28/19 0507  WBC 7.2 15.6* 17.6* 14.8*  NEUTROABS 5.2  --   --   --   HGB 13.4 12.5 10.8* 9.0*  HCT 40.1 37.4 33.0* 26.4*  MCV 94.1 93.5 95.4 92.3  PLT 195 194 145* 144*   Cardiac Enzymes: No results for input(s): CKTOTAL, CKMB, CKMBINDEX, TROPONINI in the last 168 hours. BNP (last 3 results) No results for input(s): PROBNP in the last 8760 hours. CBG: No results for input(s): GLUCAP in the last 168 hours.  Recent Results (from the past 240 hour(s))  Respiratory Panel by RT PCR (Flu A&B, Covid) - Nasopharyngeal Swab     Status: Abnormal   Collection Time: 07/25/19  7:28 PM   Specimen: Nasopharyngeal Swab  Result Value Ref Range Status   SARS Coronavirus 2 by RT PCR POSITIVE (A) NEGATIVE Final    Comment: RESULT CALLED TO, READ BACK BY AND VERIFIED WITH: KATIE FERGUSON @ 2120 ON 07/25/2019 BY  CAF (NOTE) SARS-CoV-2 target nucleic acids are DETECTED. SARS-CoV-2 RNA is generally detectable in upper respiratory specimens  during the acute phase of infection. Positive results are indicative of the presence of the identified virus, but do not rule out bacterial infection or co-infection with other pathogens not detected by the test. Clinical correlation with patient history and other diagnostic information is necessary to determine patient infection status. The expected result is Negative. Fact Sheet for Patients:  PinkCheek.be Fact Sheet for Healthcare Providers: GravelBags.it This test is not yet approved or cleared by the [place] FDA and  has been authorized for detection and/or diagnosis of SARS-CoV-2 by FDA under an Emergency Use Authorization (EUA).  This EUA will remain in effect (meaning this test can be u sed) for the duration of  the COVID-19 declaration under Section 564(b)(1) of the Act, 21 U.S.C. section 360bbb-3(b)(1), unless the authorization is terminated or revoked  sooner.    Influenza A by PCR NEGATIVE NEGATIVE Final   Influenza B by PCR NEGATIVE NEGATIVE Final    Comment: (NOTE) The Xpert Xpress SARS-CoV-2/FLU/RSV assay is intended as an aid in  the diagnosis of influenza from Nasopharyngeal swab specimens and  should not be used as a sole basis for treatment. Nasal washings and  aspirates are unacceptable for Xpert Xpress SARS-CoV-2/FLU/RSV  testing. Fact Sheet for Patients: PinkCheek.be Fact Sheet for Healthcare Providers: GravelBags.it This test is not yet approved or cleared by the [place] FDA and  has been authorized for detection and/or diagnosis of SARS-CoV-2 by  FDA under an Emergency Use Authorization (EUA). This EUA will remain  in effect (meaning this test can be used) for the duration of the  Covid-19 declaration under  Section 564(b)(1) of the Act, 21  U.S.C. section 360bbb-3(b)(1), unless the authorization is  terminated or revoked. Performed at [facility], [address] [place], [place] [address]       Studies: [place] HIP OPERATIVE UNILAT W OR W/O PELVIS LEFT  Result Date: 07/26/2019 CLINICAL DATA:  Left IM nail EXAM: OPERATIVE left HIP (WITH PELVIS IF PERFORMED) 4 VIEWS TECHNIQUE: Fluoroscopic spot image(s) were submitted for interpretation post-operatively. COMPARISON:  07/25/2019 FINDINGS: Four low resolution intraoperative spot views of the left hip. Images demonstrate internal fixation of left intertrochanteric fracture. Total fluoroscopy time was 53 seconds IMPRESSION: Intraoperative fluoroscopic assistance provided during surgical fixation of left hip fracture Electronically Signed   By: Donavan Foil M.D.   On: 07/26/2019 20:34     Scheduled Meds: . [START ON 07/29/2019] aspirin EC  81 mg Oral Daily  . Chlorhexidine Gluconate Cloth  6 each Topical Daily  . docusate sodium  100 mg Oral BID  . enalapril  2.5 mg Oral Daily  . enoxaparin (LOVENOX) injection  40 mg Subcutaneous Q24H  . feeding supplement (ENSURE ENLIVE)  237 mL Oral BID BM  . metoprolol succinate  25 mg Oral Daily  . multivitamin with minerals  1 tablet Oral Daily  . pneumococcal 23 valent vaccine  0.5 mL Intramuscular Tomorrow-1000   Continuous Infusions: . sodium chloride 100 mL/hr at 07/28/19 0511  . methocarbamol (ROBAXIN) IV      Principal Problem:   Fracture, proximal femur, left, closed, initial encounter ([place]) Active Problems:   CAD (coronary artery disease)   Essential hypertension   Dementia without behavioral disturbance ([place])   Accidental fall   Preoperative clearance   Closed left hip fracture, initial encounter ([place])     Nolen Lindamood Derek Jack, [facility]  If 7PM-7AM, please contact night-coverage [email or web address] Password [email or web address] 07/28/2019, 6:09 PM    LOS: 3 days

## 2019-07-28 NOTE — Progress Notes (Signed)
SUBJECTIVE: Patient is more comfortable last night not agitated   Vitals:   07/26/19 2352 07/27/19 0500 07/27/19 0824 07/27/19 1500  BP: 135/71  (!) 143/59 (!) 147/63  Pulse: (!) 126 86 97 (!) 119  Resp: 18 15 17 20   Temp: (!) 97.5 F (36.4 C)  98.5 F (36.9 C)   TempSrc:   Axillary   SpO2: 91% 96% 95% 98%  Weight:      Height:        Intake/Output Summary (Last 24 hours) at 07/28/2019 0840 Last data filed at 07/28/2019 0511 Gross per 24 hour  Intake 1292.77 ml  Output 300 ml  Net 992.77 ml    LABS: Basic Metabolic Panel: Recent Labs    07/27/19 0503 07/28/19 0507  NA 143 141  K 3.8 3.6  CL 108 109  CO2 24 29  GLUCOSE 116* 92  BUN 25* 27*  CREATININE 0.96 0.93  CALCIUM 8.6* 8.3*   Liver Function Tests: Recent Labs    07/25/19 1818 07/27/19 0503  AST 19 16  ALT 11 11  ALKPHOS 69 57  BILITOT 1.3* 1.2  PROT 6.3* 5.2*  ALBUMIN 2.8* 2.4*   No results for input(s): LIPASE, AMYLASE in the last 72 hours. CBC: Recent Labs    07/25/19 1818 07/26/19 0505 07/27/19 0503 07/28/19 0507  WBC 7.2   < > 17.6* 14.8*  NEUTROABS 5.2  --   --   --   HGB 13.4   < > 10.8* 9.0*  HCT 40.1   < > 33.0* 26.4*  MCV 94.1   < > 95.4 92.3  PLT 195   < > 145* 144*   < > = values in this interval not displayed.   Cardiac Enzymes: No results for input(s): CKTOTAL, CKMB, CKMBINDEX, TROPONINI in the last 72 hours. BNP: Invalid input(s): POCBNP D-Dimer: No results for input(s): DDIMER in the last 72 hours. Hemoglobin A1C: No results for input(s): HGBA1C in the last 72 hours. Fasting Lipid Panel: No results for input(s): CHOL, HDL, LDLCALC, TRIG, CHOLHDL, LDLDIRECT in the last 72 hours. Thyroid Function Tests: No results for input(s): TSH, T4TOTAL, T3FREE, THYROIDAB in the last 72 hours.  Invalid input(s): FREET3 Anemia Panel: No results for input(s): VITAMINB12, FOLATE, FERRITIN, TIBC, IRON, RETICCTPCT in the last 72 hours.   PHYSICAL EXAM General: Well developed, well  nourished, in no acute distress HEENT:  Normocephalic and atramatic Neck:  No JVD.  Lungs: Clear bilaterally to auscultation and percussion. Heart: HRRR . Normal S1 and S2 without gallops or murmurs.  Abdomen: Bowel sounds are positive, abdomen soft and non-tender  Msk:  Back normal, normal gait. Normal strength and tone for age. Extremities: No clubbing, cyanosis or edema.   Neuro: Alert and oriented X 3. Psych:  Good affect, responds appropriately  TELEMETRY: Sinus tachycardia with some ST changes  ASSESSMENT AND PLAN: Has sinus tachycardia post surgery and comfortable today.  Will repeat EKG.  Principal Problem:   Fracture, proximal femur, left, closed, initial encounter ([place]) Active Problems:   CAD (coronary artery disease)   Essential hypertension   Dementia without behavioral disturbance ([place])   Accidental fall   Preoperative clearance   Closed left hip fracture, initial encounter ([place])    Neoma Laming A, MD, [facility] 07/28/2019 8:40 AM

## 2019-07-28 NOTE — Progress Notes (Signed)
Has Inferior St elevation and probably had inferior wall MI and that's why had agitation yesterday.. But today comfortable and slept. Patient is not candidate for PCI. Will medically managae, asp/nitrates and metoprolol.

## 2019-07-28 NOTE — Progress Notes (Signed)
Spoke with [name], son, and provided update post hip surgery on [name]. Denies any additional concerns.

## 2019-07-28 NOTE — Progress Notes (Signed)
PT Cancellation Note  Patient Details Name: [name] [name] MRN: [id number] DOB: 06-19-1921   Cancelled Treatment:    Reason Eval/Treat Not Completed: Medical issues which prohibited therapy   Discussed with RN on unit and session held due to cardiac concerns.   Will continue as appropriate.   Janara Ruffini 07/28/2019, 12:34 PM

## 2019-07-28 NOTE — Progress Notes (Signed)
Spoke with Santiago Glad, daughter in law, and provided update on [name]. Educated family member on policy for providing updates. Stated understanding.

## 2019-07-28 NOTE — Progress Notes (Signed)
Spoke with [name] a second time and provided additional update... pt. Able to speak to family member at this time.

## 2019-07-28 NOTE — Progress Notes (Addendum)
Called and spoke with Son, [name] and gave pt. Update. Son expressed concerns that family has not been updated since admission - explained to son I have spoken with a family member each day to provide updates. Also reeducated family about update policy and confirmed he is the designated person to speak with. Family member expressed he did not like the policy and felt I was "slacking". Attempted to address family member Concerns with little affect.

## 2019-07-28 NOTE — Progress Notes (Signed)
   Subjective: 2 Days Post-Op Procedure(s) (LRB): INTRAMEDULLARY (IM) NAIL INTERTROCHANTRIC (Left) Patient reports pain as mild.  More alert and responsive this morning.  States her pain is mild. Patient is well, and has had no acute complaints or problems We will continue therapy today.   Objective: Vital signs in last 24 hours: Pulse Rate:  [119] 119 (04/02 1500) Resp:  [20] 20 (04/02 1500) BP: (147)/(63) 147/63 (04/02 1500) SpO2:  [98 %] 98 % (04/02 1500)  Intake/Output from previous day: 04/02 0701 - 04/03 0700 In: 1292.8 [P.O.:120; I.V.:1022.8; IV Piggyback:150] Out: 300 [Urine:300] Intake/Output this shift: No intake/output data recorded.  Recent Labs    07/25/19 1818 07/26/19 0505 07/27/19 0503 07/28/19 0507  HGB 13.4 12.5 10.8* 9.0*   Recent Labs    07/27/19 0503 07/28/19 0507  WBC 17.6* 14.8*  RBC 3.46* 2.86*  HCT 33.0* 26.4*  PLT 145* 144*   Recent Labs    07/27/19 0503 07/28/19 0507  NA 143 141  K 3.8 3.6  CL 108 109  CO2 24 29  BUN 25* 27*  CREATININE 0.96 0.93  GLUCOSE 116* 92  CALCIUM 8.6* 8.3*   Recent Labs    07/25/19 1928  INR 1.0    EXAM General - Patient is Alert and Appropriate Extremity - Sensation intact distally Intact pulses distally No cellulitis present Compartment soft Dressing - dressing C/D/I and no drainage Motor Function - intact, moving foot and toes well on exam.   Past Medical History:  Diagnosis Date  . Cancer ([place])    breast  . Chronic kidney disease   . Coronary artery disease   . GERD (gastroesophageal reflux disease)   . Gout   . HH (hiatus hernia)   . Hypercholesteremia   . Hypertension   . Murmur   . Palpitations     Assessment/Plan:   2 Days Post-Op Procedure(s) (LRB): INTRAMEDULLARY (IM) NAIL INTERTROCHANTRIC (Left) Principal Problem:   Fracture, proximal femur, left, closed, initial encounter ([place]) Active Problems:   CAD (coronary artery disease)   Essential hypertension   Dementia  without behavioral disturbance ([place])   Accidental fall   Preoperative clearance   Closed left hip fracture, initial encounter ([place])  Estimated body mass index is 20.18 kg/m as calculated from the following:   Height as of this encounter: 5\' 6"  (1.676 m).   Weight as of this encounter: 56.7 kg. Advance diet Up with therapy, weightbearing as tolerated left lower extremity Needs bowel movement Labs stable, hemoglobin 9.0 Vital signs stable Recheck labs in the morning    At discharge, skilled nursing facility to remove staples and apply Steri-Strips on 08/10/2019 TED hose bilateral lower extremity x6 weeks Lovenox 40 mg subcu daily x4 weeks   DVT Prophylaxis - Lovenox, TED hose and SCDs Weight-Bearing as tolerated to left leg   T. Rachelle Hora, PA-C [place] 07/28/2019, 9:06 AM

## 2019-07-28 NOTE — Progress Notes (Signed)
EKG completed and Dr. Humphrey Rolls notified via phone call of results - stated we will be up to look at it.

## 2019-07-29 ENCOUNTER — Inpatient Hospital Stay
Admit: 2019-07-29 | Discharge: 2019-07-29 | Disposition: A | Discharge status: Home / Self Care | Payer: Medicare Other | Attending: Cardiovascular Disease | Admitting: Cardiovascular Disease

## 2019-07-29 LAB — CBC
HCT: 25.8 % — ABNORMAL LOW (ref 36.0–46.0)
Hemoglobin: 8.6 g/dL — ABNORMAL LOW (ref 12.0–15.0)
MCH: 31.6 pg (ref 26.0–34.0)
MCHC: 33.3 g/dL (ref 30.0–36.0)
MCV: 94.9 fL (ref 80.0–100.0)
Platelets: 159 10*3/uL (ref 150–400)
RBC: 2.72 MIL/uL — ABNORMAL LOW (ref 3.87–5.11)
RDW: 12.8 % (ref 11.5–15.5)
WBC: 13.7 10*3/uL — ABNORMAL HIGH (ref 4.0–10.5)
nRBC: 0 % (ref 0.0–0.2)

## 2019-07-29 LAB — BASIC METABOLIC PANEL
Anion gap: 5 (ref 5–15)
BUN: 23 mg/dL (ref 8–23)
CO2: 28 mmol/L (ref 22–32)
Calcium: 8.2 mg/dL — ABNORMAL LOW (ref 8.9–10.3)
Chloride: 108 mmol/L (ref 98–111)
Creatinine, Ser: 0.78 mg/dL (ref 0.44–1.00)
GFR calc Af Amer: >60 mL/min (ref >60)
GFR calc non Af Amer: >60 mL/min (ref >60)
Glucose, Bld: 97 mg/dL (ref 70–99)
Potassium: 3.3 mmol/L — ABNORMAL LOW (ref 3.5–5.1)
Sodium: 141 mmol/L (ref 135–145)

## 2019-07-29 MED ORDER — POTASSIUM CHLORIDE 20 MEQ PO PACK
40.0000 meq | PACK | Freq: Once | ORAL | Status: AC
  Administered 2019-07-29: 17:00:00 40 meq via ORAL
  Filled 2019-07-29: qty 2

## 2019-07-29 NOTE — Progress Notes (Signed)
   Subjective: 3 Days Post-Op Procedure(s) (LRB): INTRAMEDULLARY (IM) NAIL INTERTROCHANTRIC (Left) Patient denies any left hip pain. Patient is well, and has had no acute complaints or problems We will continue therapy today.   Objective: Vital signs in last 24 hours: Temp:  [97.1 F (36.2 C)-98.5 F (36.9 C)] 98.5 F (36.9 C) (04/04 0300) Pulse Rate:  [75-103] 93 (04/04 0600) Resp:  [17-23] 19 (04/04 0600) BP: (107-153)/(42-115) 142/59 (04/04 0600) SpO2:  [95 %-98 %] 95 % (04/04 0600)  Intake/Output from previous day: 04/03 0701 - 04/04 0700 In: 3468.5 [P.O.:330; I.V.:2888.5] Out: 0  Intake/Output this shift: No intake/output data recorded.  Recent Labs    07/27/19 0503 07/28/19 0507 07/29/19 0607  HGB 10.8* 9.0* 8.6*   Recent Labs    07/28/19 0507 07/29/19 0607  WBC 14.8* 13.7*  RBC 2.86* 2.72*  HCT 26.4* 25.8*  PLT 144* 159   Recent Labs    07/28/19 0507 07/29/19 0607  NA 141 141  K 3.6 3.3*  CL 109 108  CO2 29 28  BUN 27* 23  CREATININE 0.93 0.78  GLUCOSE 92 97  CALCIUM 8.3* 8.2*   No results for input(s): LABPT, INR in the last 72 hours.  EXAM General - Patient is Alert and Appropriate Extremity - Sensation intact distally Intact pulses distally No cellulitis present Compartment soft Dressing - dressing C/D/I and no drainage Motor Function - intact, moving foot and toes well on exam.   Past Medical History:  Diagnosis Date  . Cancer ([place])    breast  . Chronic kidney disease   . Coronary artery disease   . GERD (gastroesophageal reflux disease)   . Gout   . HH (hiatus hernia)   . Hypercholesteremia   . Hypertension   . Murmur   . Palpitations     Assessment/Plan:   3 Days Post-Op Procedure(s) (LRB): INTRAMEDULLARY (IM) NAIL INTERTROCHANTRIC (Left) Principal Problem:   Fracture, proximal femur, left, closed, initial encounter ([place]) Active Problems:   CAD (coronary artery disease)   Essential hypertension   Dementia without  behavioral disturbance ([place])   Accidental fall   Preoperative clearance   Closed left hip fracture, initial encounter ([place])  Estimated body mass index is 20.18 kg/m as calculated from the following:   Height as of this encounter: 5\' 6"  (1.676 m).   Weight as of this encounter: 56.7 kg. Advance diet Up with therapy, weightbearing as tolerated left lower extremity Needs bowel movement Labs stable, hemoglobin 8.6.  Recheck hemoglobin in the morning. Vital signs stable   At discharge, skilled nursing facility to remove staples and apply Steri-Strips on 08/10/2019 TED hose bilateral lower extremity x6 weeks Lovenox 40 mg subcu daily x4 weeks   DVT Prophylaxis - Lovenox, TED hose and SCDs Weight-Bearing as tolerated to left leg   T. Rachelle Hora, PA-C [place] 07/29/2019, 7:13 AM

## 2019-07-29 NOTE — Progress Notes (Signed)
Physical Therapy Treatment Patient Details Name: [name] [name] MRN: [id number] DOB: 11/17/1921 Today's Date: 07/29/2019    History of Present Illness 86 year old independent living female with full-time caregiver who has PMH significant for HTN, CAD and dementia with sundowning is admitted 07/25/2019 with left hip fracture status post mechanical fall, s/p IM nailing (07/26/19), WBAT.  Preop evaluation revealed positive Covid PCR.  Patient had no oxygen requirement and chest x-ray was without acute process.    PT Comments    Pt in bed upon arrival, noted with soaked chuck pads purewick had migrated from dorsal to volar and thus ineffective. Pt assisted with bed mobility off wet linen onto dry, her dressing not adherent to anterior distal portion of skin, hence tape added to keep contact. Pt participates in AA/ROM of LLE, no grimacing, reports to feel good. MaxA to EOB where pt requires several minutes to achieve independent seated balance, otherwise has continual posterior lean. MaxA dependent transfer to standing, takes several small steps toward [facility], but needs mod-maxA for weight shifting and Left knee block to rectify Lt knee buckling in weight acceptance/SLS. Pt requires slightly less assist to come to standing with RW, about 30s to achieve balance without physical assist. Pt continued to progress slowly, could attempt some short distance AMB next session c RW.    Follow Up Recommendations  SNF     Equipment Recommendations  Rolling walker with 5" wheels;3in1 (PT)    Recommendations for Other Services       Precautions / Restrictions Precautions Precautions: Fall Restrictions LLE Weight Bearing: Weight bearing as tolerated    Mobility  Bed Mobility Overal bed mobility: Needs Assistance Bed Mobility: Supine to Sit;Sit to Supine     Supine to sit: +2 for physical assistance;Max assist Sit to supine: +2 for physical assistance;Max assist   General bed mobility comments: struggles  with sitting at EOB independently, requires several minutes to achieve  Transfers Overall transfer level: Needs assistance Equipment used: Rolling walker (2 wheeled);None Transfers: Sit to/from Stand Sit to Stand: Mod assist;Max assist         General transfer comment: MaxA without AD, ModA with RW; stands for 60sec, 30-40sec to establish independent standing balance.  Ambulation/Gait Ambulation/Gait assistance: (not attempted, but pt takes modA side steps toward [facility].)               Stairs             Wheelchair Mobility    Modified Rankin (Stroke Patients Only)       Balance                                            Cognition Arousal/Alertness: Awake/alert Behavior During Therapy: WFL for tasks assessed/performed Overall Cognitive Status: No family/caregiver present to determine baseline cognitive functioning                                 General Comments: Hx of dementia c sundowning. Follows commands, pleasant and cooperative      Exercises Total Joint Exercises Heel Slides: AAROM;Left;15 reps;Supine Hip ABduction/ADduction: AAROM;Left;15 reps;Supine    General Comments        Pertinent Vitals/Pain Pain Assessment: No/denies pain Faces Pain Scale: No hurt    Home Living  Prior Function            PT Goals (current goals can now be found in the care plan section) Acute Rehab PT Goals Patient Stated Goal: To go home PT Goal Formulation: With patient Time For Goal Achievement: 08/10/19 Potential to Achieve Goals: Fair Progress towards PT goals: Progressing toward goals    Frequency    7X/week      PT Plan Current plan remains appropriate    Co-evaluation              AM-PAC PT "6 Clicks" Mobility   Outcome Measure  Help needed turning from your back to your side while in a flat bed without using bedrails?: A Lot Help needed moving from lying on your back  to sitting on the side of a flat bed without using bedrails?: A Lot Help needed moving to and from a bed to a chair (including a wheelchair)?: A Lot Help needed standing up from a chair using your arms (e.g., wheelchair or bedside chair)?: A Lot Help needed to walk in hospital room?: A Lot Help needed climbing 3-5 steps with a railing? : Total 6 Click Score: 11    End of Session   Activity Tolerance: Patient tolerated treatment well;No increased pain Patient left: in bed;with call bell/phone within reach;with bed alarm set;Other (comment)(sitting tall, awaiting lunch tray) Nurse Communication: Mobility status PT Visit Diagnosis: Muscle weakness (generalized) (M62.81);Difficulty in walking, not elsewhere classified (R26.2);Pain Pain - Right/Left: Left Pain - part of body: Hip     Time: [id number] PT Time Calculation (min) (ACUTE ONLY): 39 min  Charges:  $Therapeutic Exercise: 38-52 mins                     12:39 PM, 07/29/19 Etta Grandchild, PT, DPT Physical Therapist - [facility]  [phone] ([place])    Palmer C 07/29/2019, 12:34 PM

## 2019-07-29 NOTE — Progress Notes (Addendum)
PROGRESS NOTE    [name] [name]  [id number]  DOB: 1921-08-03  DOA: 07/25/2019 PCP: Jodi Marble, MD Outpatient Specialists:   Hospital course:  86 year old independent living female with full-time caregiver who has PMH significant for HTN, CAD and dementia with sundowning is admitted 07/25/2019 with left hip fracture status post mechanical fall.  Preop evaluation revealed positive Covid PCR.  Patient had no oxygen requirement and chest x-ray was without acute process.  Subjective:  Patient appears tired.  However she says to me "Happy Easter" with a smile.  She admits to being tired.  She does not remember getting out of bed.  She does think PT is working with her but is not sure.  Not very hungry today.   Objective: Vitals:   07/29/19 0400 07/29/19 0500 07/29/19 0600 07/29/19 0800  BP: (!) 123/53 138/65 (!) 142/59 (!) 153/64  Pulse: 86 95 93 93  Resp: 18 19 19 18   Temp:    98.5 F (36.9 C)  TempSrc:    Oral  SpO2: 95% 95% 95% 96%  Weight:      Height:        Intake/Output Summary (Last 24 hours) at 07/29/2019 1555 Last data filed at 07/29/2019 0830 Gross per 24 hour  Intake 3708.47 ml  Output 0 ml  Net 3708.47 ml   Filed Weights   07/25/19 1827  Weight: 56.7 kg     Assessment & Plan:   86 year old independent living female with full-time caregiver and history of HTN, CAD and dementia with sundowning presents with left hip fracture after mechanical fall.  She is found to be Covid positive with negative chest x-ray no hypoxia.  Lethargy Patient again looks very tired and want today. Potassium is a little low which I will supplement. She remains on normal saline 100 cc an hour which is appropriate given patient's variable appetite. PT continues to work with patient.  Leukocytosis Is improving Patient remains afebrile UA is negative Patient has no oxygen requirement and chest x-ray remains negative Abdomen is soft  Anemia Patient had a progressive  decline in her H&H since her surgery but this has stabilized overnight. Patient remains hemodynamically stable. We will continue to follow.  Left hip fracture, proximal femur fracture POD #3 status post pinning PT OT are working with patient. Pain management and DVT prophylaxis per orthopedics.  COVID-19 infection Chest x-ray is without acute abnormalities, patient is not hypoxic. We will not start remdesivir as patient does not really have severe disease as per CDC/WHO guidelines. No indication for steroids given no hypoxia.  CAD (coronary artery disease) Does not appear to be on any CAD medications at home. Does not appear to be on any antiplatelets at home.  HTN She is relatively normotensive, does not appear to be on any BP meds at home.  Dementia Reorder Haldol per home regimen however will make it every 8 as needed rather than twice daily standing.   DVT prophylaxis: SCD Code Status: DNR Family Communication: Spoke with son Kathyjo Soles, he is very realistic about patient's age and prognosis. Disposition Plan:   Patient is from: Home  Anticipated Discharge Location: Rehab  Barriers to Discharge: Acute hip fracture  Is patient medically stable for Discharge: No   Consultants:  Orthopedics  Cardiology for preoperative evaluation.  Procedures:  For surgery in the morning  Antimicrobials:  None   Exam:  General: Thin pleasant female lying in bed appearing uncomfortable but denying that she was uncomfortable.  Respiratory  distress. Eyes: sclera anicteric, conjuctiva mild injection bilaterally CVS: S1-S2, regular  Respiratory:  decreased air entry bilaterally secondary to decreased inspiratory effort, rales at bases  GI: Emaciated, scaphoid, positive bowel sounds.  Nontender. LE: No edema.  Neuro: A/O x 3, Moving all extremities equally with normal strength, CN 3-12 intact, grossly nonfocal.    Data Reviewed: Basic Metabolic Panel: Recent Labs  Lab  07/25/19 1818 07/26/19 0505 07/27/19 0503 07/28/19 0507 07/29/19 0607  NA 141 142 143 141 141  K 3.3* 4.0 3.8 3.6 3.3*  CL 101 102 108 109 108  CO2 31 28 24 29 28   GLUCOSE 114* 136* 116* 92 97  BUN 16 19 25* 27* 23  CREATININE 0.90 1.03* 0.96 0.93 0.78  CALCIUM 9.2 9.2 8.6* 8.3* 8.2*   Liver Function Tests: Recent Labs  Lab 07/25/19 1818 07/27/19 0503  AST 19 16  ALT 11 11  ALKPHOS 69 57  BILITOT 1.3* 1.2  PROT 6.3* 5.2*  ALBUMIN 2.8* 2.4*   No results for input(s): LIPASE, AMYLASE in the last 168 hours. No results for input(s): AMMONIA in the last 168 hours. CBC: Recent Labs  Lab 07/25/19 1818 07/26/19 0505 07/27/19 0503 07/28/19 0507 07/29/19 0607  WBC 7.2 15.6* 17.6* 14.8* 13.7*  NEUTROABS 5.2  --   --   --   --   HGB 13.4 12.5 10.8* 9.0* 8.6*  HCT 40.1 37.4 33.0* 26.4* 25.8*  MCV 94.1 93.5 95.4 92.3 94.9  PLT 195 194 145* 144* 159   Cardiac Enzymes: No results for input(s): CKTOTAL, CKMB, CKMBINDEX, TROPONINI in the last 168 hours. BNP (last 3 results) No results for input(s): PROBNP in the last 8760 hours. CBG: No results for input(s): GLUCAP in the last 168 hours.  Recent Results (from the past 240 hour(s))  Respiratory Panel by RT PCR (Flu A&B, Covid) - Nasopharyngeal Swab     Status: Abnormal   Collection Time: 07/25/19  7:28 PM   Specimen: Nasopharyngeal Swab  Result Value Ref Range Status   SARS Coronavirus 2 by RT PCR POSITIVE (A) NEGATIVE Final    Comment: RESULT CALLED TO, READ BACK BY AND VERIFIED WITH: KATIE FERGUSON @ 2120 ON 07/25/2019 BY CAF (NOTE) SARS-CoV-2 target nucleic acids are DETECTED. SARS-CoV-2 RNA is generally detectable in upper respiratory specimens  during the acute phase of infection. Positive results are indicative of the presence of the identified virus, but do not rule out bacterial infection or co-infection with other pathogens not detected by the test. Clinical correlation with patient history and other diagnostic  information is necessary to determine patient infection status. The expected result is Negative. Fact Sheet for Patients:  PinkCheek.be Fact Sheet for Healthcare Providers: GravelBags.it This test is not yet approved or cleared by the [place] FDA and  has been authorized for detection and/or diagnosis of SARS-CoV-2 by FDA under an Emergency Use Authorization (EUA).  This EUA will remain in effect (meaning this test can be u sed) for the duration of  the COVID-19 declaration under Section 564(b)(1) of the Act, 21 U.S.C. section 360bbb-3(b)(1), unless the authorization is terminated or revoked sooner.    Influenza A by PCR NEGATIVE NEGATIVE Final   Influenza B by PCR NEGATIVE NEGATIVE Final    Comment: (NOTE) The Xpert Xpress SARS-CoV-2/FLU/RSV assay is intended as an aid in  the diagnosis of influenza from Nasopharyngeal swab specimens and  should not be used as a sole basis for treatment. Nasal washings and  aspirates are unacceptable for  Xpert Xpress SARS-CoV-2/FLU/RSV  testing. Fact Sheet for Patients: PinkCheek.be Fact Sheet for Healthcare Providers: GravelBags.it This test is not yet approved or cleared by the [place] FDA and  has been authorized for detection and/or diagnosis of SARS-CoV-2 by  FDA under an Emergency Use Authorization (EUA). This EUA will remain  in effect (meaning this test can be used) for the duration of the  Covid-19 declaration under Section 564(b)(1) of the Act, 21  U.S.C. section 360bbb-3(b)(1), unless the authorization is  terminated or revoked. Performed at [facility], [address] [place], [place] [address]       Studies: No results found.   Scheduled Meds: . aspirin EC  81 mg Oral Daily  . Chlorhexidine Gluconate Cloth  6 each Topical Daily  . docusate sodium  100 mg Oral BID  . enalapril  2.5 mg  Oral Daily  . enoxaparin (LOVENOX) injection  40 mg Subcutaneous Q24H  . feeding supplement (ENSURE ENLIVE)  237 mL Oral BID BM  . metoprolol succinate  25 mg Oral Daily  . multivitamin with minerals  1 tablet Oral Daily  . pneumococcal 23 valent vaccine  0.5 mL Intramuscular Tomorrow-1000   Continuous Infusions: . sodium chloride Stopped (07/29/19 0711)  . methocarbamol (ROBAXIN) IV      Principal Problem:   Fracture, proximal femur, left, closed, initial encounter ([place]) Active Problems:   CAD (coronary artery disease)   Essential hypertension   Dementia without behavioral disturbance ([place])   Accidental fall   Preoperative clearance   Closed left hip fracture, initial encounter ([place])     Rennae Ferraiolo Derek Jack, [facility]  If 7PM-7AM, please contact night-coverage [email or web address] Password [email or web address] 07/29/2019, 3:55 PM    LOS: 4 days

## 2019-07-29 NOTE — Plan of Care (Signed)
Discussed with patient plan of care for the evening, pain management and call bell with some teach back displayed; needs reinforcement

## 2019-07-29 NOTE — NC FL2 (Signed)
[place] LEVEL OF CARE SCREENING TOOL     IDENTIFICATION  Patient Name: [name] [name] Birthdate: 01-10-1922 Sex: female Admission Date (Current Location): 07/25/2019  [place] and [place] Number:  Engineering geologist and Address:  [facility], [address], [place], [place] [address]      Provider Number:    Attending Physician Name and Address:  Oren Binet*  Relative Name and Phone Number:  [phone]    Current Level of Care: Hospital Recommended Level of Care: [place] Prior Approval Number:    Date Approved/Denied:   PASRR Number: [id number]  Discharge Plan: Other (Comment)(ALF)    Current Diagnoses: Patient Active Problem List   Diagnosis Date Noted  . Fracture, proximal femur, left, closed, initial encounter ([place]) 07/25/2019  . CAD (coronary artery disease) 07/25/2019  . Essential hypertension 07/25/2019  . Dementia without behavioral disturbance ([place]) 07/25/2019  . Accidental fall 07/25/2019  . Preoperative clearance 07/25/2019  . Closed left hip fracture, initial encounter ([place]) 07/25/2019  . Chest pain 10/23/2017    Orientation RESPIRATION BLADDER Height & Weight     Self(flucuating orientation, suspected sundowners)  Normal Incontinent Weight: 125 lb (56.7 kg) Height:  5\' 6"  (167.6 cm)  BEHAVIORAL SYMPTOMS/MOOD NEUROLOGICAL BOWEL NUTRITION STATUS    (Dementia) Incontinent Diet(Nectar thick)  AMBULATORY STATUS COMMUNICATION OF NEEDS Skin   Extensive Assist Verbally Normal                       Personal Care Assistance Level of Assistance  Bathing, Feeding, Dressing Bathing Assistance: Limited assistance Feeding assistance: Limited assistance Dressing Assistance: Limited assistance     Functional Limitations Info  Sight, Hearing, Speech Sight Info: Adequate Hearing Info: Adequate Speech Info: Adequate    SPECIAL CARE FACTORS FREQUENCY  PT (By licensed PT),  OT (By licensed OT)     PT Frequency: 5x week OT Frequency: 5x week            Contractures Contractures Info: Not present    Additional Factors Info  Code Status Code Status Info: DNR             Current Medications (07/29/2019):  This is the current hospital active medication list Current Facility-Administered Medications  Medication Dose Route Frequency Provider Last Rate Last Admin  . 0.9 %  sodium chloride infusion   Intravenous Continuous Vashti Hey, MD   Stopped at 07/29/19 [phone]  . aspirin EC tablet 81 mg  81 mg Oral Daily Benita Gutter, RPH   81 mg at 07/29/19 1056  . bisacodyl (DULCOLAX) suppository 10 mg  10 mg Rectal Daily PRN Leim Fabry, MD      . Chlorhexidine Gluconate Cloth 2 % PADS 6 each  6 each Topical Daily Vashti Hey, MD   6 each at 07/26/19 2200  . docusate sodium (COLACE) capsule 100 mg  100 mg Oral BID Leim Fabry, MD   100 mg at 07/29/19 1056  . enalapril (VASOTEC) tablet 2.5 mg  2.5 mg Oral Daily Neoma Laming A, MD   2.5 mg at 07/29/19 1056  . enoxaparin (LOVENOX) injection 40 mg  40 mg Subcutaneous Q24H Leim Fabry, MD   40 mg at 07/29/19 1056  . feeding supplement (ENSURE ENLIVE) (ENSURE ENLIVE) liquid 237 mL  237 mL Oral BID BM Bonnell Public Tublu, MD   237 mL at 07/28/19 1137  . haloperidol (HALDOL) 2 MG/ML solution 1 mg  1 mg Oral  Q8H PRN Bonnell Public Tublu, MD      . HYDROmorphone (DILAUDID) injection 0.25-0.5 mg  0.25-0.5 mg Intravenous Q2H PRN Leim Fabry, MD      . methocarbamol (ROBAXIN) tablet 500 mg  500 mg Oral Q6H PRN Leim Fabry, MD       Or  . methocarbamol (ROBAXIN) 500 mg in dextrose 5 % 50 mL IVPB  500 mg Intravenous Q6H PRN Leim Fabry, MD      . metoCLOPramide (REGLAN) tablet 5-10 mg  5-10 mg Oral Q8H PRN Leim Fabry, MD       Or  . metoCLOPramide (REGLAN) injection 5-10 mg  5-10 mg Intravenous Q8H PRN Leim Fabry, MD      . metoprolol succinate (TOPROL-XL) 24 hr tablet 25 mg   25 mg Oral Daily Neoma Laming A, MD   25 mg at 07/29/19 1056  . multivitamin with minerals tablet 1 tablet  1 tablet Oral Daily Vashti Hey, MD   1 tablet at 07/29/19 1056  . ondansetron (ZOFRAN) tablet 4 mg  4 mg Oral Q6H PRN Leim Fabry, MD       Or  . ondansetron [facility]) injection 4 mg  4 mg Intravenous Q6H PRN Leim Fabry, MD      . oxyCODONE (Oxy IR/ROXICODONE) immediate release tablet 2.5-5 mg  2.5-5 mg Oral Q3H PRN Leim Fabry, MD      . oxyCODONE (Oxy IR/ROXICODONE) immediate release tablet 5-10 mg  5-10 mg Oral Q4H PRN Leim Fabry, MD      . pneumococcal 23 valent vaccine (PNEUMOVAX-23) injection 0.5 mL  0.5 mL Intramuscular Tomorrow-1000 Bonnell Public Tublu, MD      . senna-docusate (Senokot-S) tablet 1 tablet  1 tablet Oral QHS PRN Leim Fabry, MD      . sodium phosphate (FLEET) 7-19 GM/118ML enema 1 enema  1 enema Rectal Once PRN Leim Fabry, MD      . traMADol Veatrice Bourbon) tablet 50 mg  50 mg Oral Q6H PRN Leim Fabry, MD         Discharge Medications: Please see discharge summary for a list of discharge medications.  Relevant Imaging Results:  Relevant Lab Results:   Additional Information [address], [place]

## 2019-07-29 NOTE — TOC Progression Note (Signed)
Transition of Care [facility]) - Progression Note    Patient Details  Name: [name] [name] MRN: [id number] Date of Birth: Nov 28, 1921  [facility]) CM/SW Contact  Boris Sharper, LCSW Phone Number:[phone] 07/29/2019, 12:21 PM  Clinical Narrative:    CSW completed FL2 and faxed out to [facility] (family preference) ALF and surrounding facilities.   TOC will continue to follow for discharge planning needs.   Expected Discharge Plan: [place]    Expected Discharge Plan and Services Expected Discharge Plan: [place] arrangements for the past 2 months: Single Family Home                                       Social Determinants of Health (SDOH) Interventions    Readmission Risk Interventions No flowsheet data found.

## 2019-07-29 NOTE — Progress Notes (Signed)
*  PRELIMINARY RESULTS* Echocardiogram 2D Echocardiogram has been performed.  [name] [name] S [name] [name] 07/29/2019, 2:31 PM 

## 2019-07-29 NOTE — Progress Notes (Signed)
Spoke with [name], Son, and provided update on patient. Stated all concerns were addressed and denies any further questions.

## 2019-07-30 LAB — BASIC METABOLIC PANEL
Anion gap: 6 (ref 5–15)
BUN: 26 mg/dL — ABNORMAL HIGH (ref 8–23)
CO2: 28 mmol/L (ref 22–32)
Calcium: 8.3 mg/dL — ABNORMAL LOW (ref 8.9–10.3)
Chloride: 108 mmol/L (ref 98–111)
Creatinine, Ser: 0.75 mg/dL (ref 0.44–1.00)
GFR calc Af Amer: >60 mL/min (ref >60)
GFR calc non Af Amer: >60 mL/min (ref >60)
Glucose, Bld: 81 mg/dL (ref 70–99)
Potassium: 3.8 mmol/L (ref 3.5–5.1)
Sodium: 142 mmol/L (ref 135–145)

## 2019-07-30 LAB — ECHOCARDIOGRAM COMPLETE
Height: 66 in
Weight: 2000 oz

## 2019-07-30 MED ORDER — OXYCODONE HCL 5 MG PO TABS: 2.5000 mg | ORAL_TABLET | ORAL | Status: DC | PRN

## 2019-07-30 MED ORDER — BISACODYL 10 MG RE SUPP
10.0000 mg | RECTAL | Status: AC
  Filled 2019-07-30: qty 1

## 2019-07-30 MED ORDER — FLEET ENEMA 7-19 GM/118ML RE ENEM: 1.0000 | ENEMA | Freq: Every day | RECTAL | Status: DC | PRN

## 2019-07-30 NOTE — TOC Progression Note (Signed)
Transition of Care [facility]) - Progression Note    Patient Details  Name: [name] [name] MRN: [id number] Date of Birth: [birth year]  Transition of Care [facility]) CM/SW Contact  Olivia Pavelko, Gardiner Rhyme, LCSW Phone Number: 07/30/2019, 3:50 PM  Clinical Narrative:   Have contacted several facilities-[facility], [facility] which are not taking COVID pt's anymore. Trying to find a facility that takes COVID pt's along with her [facility]. Have left message [facility] to see if can take and if taking COVID pt's.     Expected Discharge Plan: [place]    Expected Discharge Plan and Services Expected Discharge Plan: [place] arrangements for the past 2 months: Single Family Home                                       Social Determinants of Health (SDOH) Interventions    Readmission Risk Interventions No flowsheet data found.

## 2019-07-30 NOTE — TOC Progression Note (Signed)
Transition of Care [facility]) - Progression Note    Patient Details  Name: [name] [name] MRN: [id number] Date of Birth: 1921-12-20  Transition of Care [facility]) CM/SW Contact  Talicia Sui, Gardiner Rhyme, LCSW Phone Number: 07/30/2019, 10:12 AM  Clinical Narrative:   Spoke with son-Dan via telephone to discuss pt is at a SNF level and not ALF. Have re-done the Fl2 and will sent out for bed offers. He is aware this is short term and she will continue to need 24 hr care at DC from SNF. He is aware of this and will work on this from SNF. Made aware only a few of SNF's taking COVID pt's. Faxed out Fl2 and work on [employer]    Expected Discharge Plan: [place]    Expected Discharge Plan and Services Expected Discharge Plan: [place] arrangements for the past 2 months: Single Family Home                                       Social Determinants of Health (SDOH) Interventions    Readmission Risk Interventions No flowsheet data found.

## 2019-07-30 NOTE — Progress Notes (Addendum)
PROGRESS NOTE    [name] [name]  [id number]  DOB: [birth year]  DOA: 07/25/2019 PCP: Jodi Marble, MD Outpatient Specialists:   Hospital course:  86 year old independent living female with full-time caregiver who has PMH significant for HTN, CAD and dementia with sundowning is admitted 07/25/2019 with left hip fracture status post mechanical fall.  Preop evaluation revealed positive Covid PCR.  Patient had no oxygen requirement and chest x-ray was without acute process.  Subjective:  Patient states she is doing okay.  Does remember getting up out of bed with PT.  No acute complaints.Does not remember having a BM but does have urge now.   Discussed with RN, patient attempted to defecate but was unable to do so.   Objective: Vitals:   07/29/19 1500 07/29/19 1600 07/29/19 1700 07/30/19 0800  BP: 131/65 (!) 127/52 (!) 131/55 (!) 150/58  Pulse: 82 85 83 85  Resp: 19 18 19 15   Temp:   97.6 F (36.4 C) (!) 97.2 F (36.2 C)  TempSrc:   Axillary Axillary  SpO2: 99% 96% 96% 96%  Weight:      Height:        Intake/Output Summary (Last 24 hours) at 07/30/2019 1514 Last data filed at 07/30/2019 [id number] Gross per 24 hour  Intake 240 ml  Output 700 ml  Net -460 ml   Filed Weights   07/25/19 1827  Weight: 56.7 kg     Assessment & Plan:   86 year old independent living female with full-time caregiver and history of HTN, CAD and dementia with sundowning presents with left hip fracture after mechanical fall.  She is found to be Covid positive with negative chest x-ray no hypoxia.  Lethargy Patient is better today, seems to have more energy as before. Appreciate dietary consult  Constipation Patient unable to swallow senna as ordered Will try Dulcolax suppository and if not effective, try Fleet enema   Leukocytosis No clear etiology, possibly secondary to constipation Patient remains afebrile UA is negative Patient has no oxygen requirement and chest x-ray remains  negative Abdomen is soft  Anemia Patient had a progressive decline in her H&H since her surgery but this has stabilized  Patient remains hemodynamically stable. We will continue to follow.  Left hip fracture, proximal femur fracture POD #4 status post pinning PT OT are working with patient. Pain management and DVT prophylaxis per orthopedics Awaiting discharge to a Covid positive rehab bed.  COVID-19 infection Chest x-ray is without acute abnormalities, patient is not hypoxic. We will not start remdesivir as patient does not really have severe disease as per CDC/WHO guidelines. No indication for steroids given no hypoxia.  CAD (coronary artery disease) Does not appear to be on any CAD medications at home. Does not appear to be on any antiplatelets at home.  HTN She is relatively normotensive, does not appear to be on any BP meds at home.  Dementia Reorder Haldol per home regimen however will make it every 8 as needed rather than twice daily standing.   DVT prophylaxis: SCD Code Status: DNR Family Communication: Spoke with son [name] [name], he is very realistic about patient's age and prognosis. Disposition Plan:   Patient is from: Home  Anticipated Discharge Location: Rehab  Barriers to Discharge: Looking for Covid rehab bed  Is patient medically stable for Discharge: Yes   Consultants:  Orthopedics  Cardiology for preoperative evaluation.  Procedures:  For surgery in the morning  Antimicrobials:  None   Exam:  General: Thin pleasant female  lying in bed in no acute distress, smiling, conversant. Eyes: sclera anicteric, conjuctiva mild injection bilaterally CVS: S1-S2, regular  Respiratory:  decreased air entry bilaterally secondary to decreased inspiratory effort, rales at bases  GI: Emaciated, scaphoid, positive bowel sounds.  Nontender. LE: No edema.  Neuro: A/O x 3, Moving all extremities equally with normal strength, CN 3-12 intact, grossly  nonfocal.    Data Reviewed: Basic Metabolic Panel: Recent Labs  Lab 07/26/19 0505 07/27/19 0503 07/28/19 0507 07/29/19 0607 07/30/19 0600  NA 142 143 141 141 142  K 4.0 3.8 3.6 3.3* 3.8  CL 102 108 109 108 108  CO2 28 24 29 28 28   GLUCOSE 136* 116* 92 97 81  BUN 19 25* 27* 23 26*  CREATININE 1.03* 0.96 0.93 0.78 0.75  CALCIUM 9.2 8.6* 8.3* 8.2* 8.3*   Liver Function Tests: Recent Labs  Lab 07/25/19 1818 07/27/19 0503  AST 19 16  ALT 11 11  ALKPHOS 69 57  BILITOT 1.3* 1.2  PROT 6.3* 5.2*  ALBUMIN 2.8* 2.4*   No results for input(s): LIPASE, AMYLASE in the last 168 hours. No results for input(s): AMMONIA in the last 168 hours. CBC: Recent Labs  Lab 07/25/19 1818 07/26/19 0505 07/27/19 0503 07/28/19 0507 07/29/19 0607  WBC 7.2 15.6* 17.6* 14.8* 13.7*  NEUTROABS 5.2  --   --   --   --   HGB 13.4 12.5 10.8* 9.0* 8.6*  HCT 40.1 37.4 33.0* 26.4* 25.8*  MCV 94.1 93.5 95.4 92.3 94.9  PLT 195 194 145* 144* 159   Cardiac Enzymes: No results for input(s): CKTOTAL, CKMB, CKMBINDEX, TROPONINI in the last 168 hours. BNP (last 3 results) No results for input(s): PROBNP in the last 8760 hours. CBG: No results for input(s): GLUCAP in the last 168 hours.  Recent Results (from the past 240 hour(s))  Respiratory Panel by RT PCR (Flu A&B, Covid) - Nasopharyngeal Swab     Status: Abnormal   Collection Time: 07/25/19  7:28 PM   Specimen: Nasopharyngeal Swab  Result Value Ref Range Status   SARS Coronavirus 2 by RT PCR POSITIVE (A) NEGATIVE Final    Comment: RESULT CALLED TO, READ BACK BY AND VERIFIED WITH: KATIE FERGUSON @ 2120 ON 07/25/2019 BY [facility] (NOTE) SARS-CoV-2 target nucleic acids are DETECTED. SARS-CoV-2 RNA is generally detectable in upper respiratory specimens  during the acute phase of infection. Positive results are indicative of the presence of the identified virus, but do not rule out bacterial infection or co-infection with other pathogens not detected by  the test. Clinical correlation with patient history and other diagnostic information is necessary to determine patient infection status. The expected result is Negative. Fact Sheet for Patients:  PinkCheek.be Fact Sheet for Healthcare Providers: GravelBags.it This test is not yet approved or cleared by the [place] FDA and  has been authorized for detection and/or diagnosis of SARS-CoV-2 by FDA under an Emergency Use Authorization (EUA).  This EUA will remain in effect (meaning this test can be u sed) for the duration of  the COVID-19 declaration under Section 564(b)(1) of the Act, 21 U.S.C. section 360bbb-3(b)(1), unless the authorization is terminated or revoked sooner.    Influenza A by PCR NEGATIVE NEGATIVE Final   Influenza B by PCR NEGATIVE NEGATIVE Final    Comment: (NOTE) The Xpert Xpress SARS-CoV-2/FLU/RSV assay is intended as an aid in  the diagnosis of influenza from Nasopharyngeal swab specimens and  should not be used as a sole basis for treatment.  Nasal washings and  aspirates are unacceptable for Xpert Xpress SARS-CoV-2/FLU/RSV  testing. Fact Sheet for Patients: PinkCheek.be Fact Sheet for Healthcare Providers: GravelBags.it This test is not yet approved or cleared by the [place] FDA and  has been authorized for detection and/or diagnosis of SARS-CoV-2 by  FDA under an Emergency Use Authorization (EUA). This EUA will remain  in effect (meaning this test can be used) for the duration of the  Covid-19 declaration under Section 564(b)(1) of the Act, 21  U.S.C. section 360bbb-3(b)(1), unless the authorization is  terminated or revoked. Performed at [facility], [address] [place], [place] [address]       Studies: ECHOCARDIOGRAM COMPLETE  Result Date: 07/30/2019    ECHOCARDIOGRAM REPORT   Patient Name:   [name] [name] Date  of Exam: 07/29/2019 Medical Rec #:  [id number]        Height:       66.0 in Accession #:    [id number]       Weight:       125.0 lb Date of Birth:  1921/07/28         BSA:          1.638 m Patient Age:    89 years         BP:           142/59 mmHg Patient Gender: F                HR:           86 bpm. Exam Location:  [facility] Procedure: 2D Echo Indications:     CAD Native Vessel 414.01/ I25.10  History:         Patient has no prior history of Echocardiogram examinations.  Sonographer:     Arville Go RDCS Referring Phys:  [place] Diagnosing Phys: Neoma Laming MD IMPRESSIONS  1. Left ventricular ejection fraction, by estimation, is 60 to 65%. The left ventricle has normal function. The left ventricle has no regional wall motion abnormalities. Left ventricular diastolic parameters are consistent with Grade I diastolic dysfunction (impaired relaxation).  2. Right ventricular systolic function is normal. The right ventricular size is normal.  3. The mitral valve is normal in structure. Mild mitral valve regurgitation. No evidence of mitral stenosis.  4. The aortic valve is normal in structure. Aortic valve regurgitation is mild. Mild aortic valve sclerosis is present, with no evidence of aortic valve stenosis.  5. The inferior vena cava is normal in size with greater than 50% respiratory variability, suggesting right atrial pressure of 3 mmHg. FINDINGS  Left Ventricle: Left ventricular ejection fraction, by estimation, is 60 to 65%. The left ventricle has normal function. The left ventricle has no regional wall motion abnormalities. The left ventricular internal cavity size was normal in size. There is  no left ventricular hypertrophy. Left ventricular diastolic parameters are consistent with Grade I diastolic dysfunction (impaired relaxation). Right Ventricle: The right ventricular size is normal. No increase in right ventricular wall thickness. Right ventricular systolic function is normal. Left Atrium: Left  atrial size was normal in size. Right Atrium: Right atrial size was normal in size. Pericardium: There is no evidence of pericardial effusion. Mitral Valve: The mitral valve is normal in structure. Normal mobility of the mitral valve leaflets. Mild mitral valve regurgitation. No evidence of mitral valve stenosis. MV peak gradient, 12.4 mmHg. The mean mitral valve gradient is 5.0 mmHg. Tricuspid Valve: The tricuspid valve is normal in  structure. Tricuspid valve regurgitation is mild . No evidence of tricuspid stenosis. Aortic Valve: The aortic valve is normal in structure. Aortic valve regurgitation is mild. Mild aortic valve sclerosis is present, with no evidence of aortic valve stenosis. Aortic valve peak gradient measures 5.5 mmHg. Pulmonic Valve: The pulmonic valve was normal in structure. Pulmonic valve regurgitation is mild. No evidence of pulmonic stenosis. Aorta: The aortic root is normal in size and structure. Venous: The inferior vena cava is normal in size with greater than 50% respiratory variability, suggesting right atrial pressure of 3 mmHg. IAS/Shunts: No atrial level shunt detected by color flow Doppler.  LEFT VENTRICLE PLAX 2D LVIDd:         2.62 cm  Diastology LVIDs:         1.89 cm  LV e' lateral:   6.64 cm/s LV PW:         1.52 cm  LV E/e' lateral: 14.3 LV IVS:        1.49 cm  LV e' medial:    3.81 cm/s LVOT diam:     1.70 cm  LV E/e' medial:  25.0 LV SV:         40 LV SV Index:   25 LVOT Area:     2.27 cm  RIGHT VENTRICLE RV Basal diam:  2.68 cm RV S prime:     12.80 cm/s TAPSE (M-mode): 1.8 cm LEFT ATRIUM           Index       RIGHT ATRIUM          Index LA diam:      2.30 cm 1.40 cm/m  RA Area:     9.56 cm LA Vol (A2C): 11.3 ml 6.90 ml/m  RA Volume:   17.40 ml 10.62 ml/m LA Vol (A4C): 44.5 ml 27.17 ml/m  AORTIC VALVE                PULMONIC VALVE AV Area (Vmax): 1.78 cm    PV Vmax:       0.95 m/s AV Vmax:        117.00 cm/s PV Peak grad:  3.6 mmHg AV Peak Grad:   5.5 mmHg LVOT Vmax:       91.60 cm/s LVOT Vmean:     60.300 cm/s LVOT VTI:       0.178 m  AORTA Ao Root diam: 3.00 cm Ao Asc diam:  2.90 cm MITRAL VALVE                TRICUSPID VALVE MV Area (PHT): 2.39 cm     TV Peak grad:   19.4 mmHg MV Peak grad:  12.4 mmHg    TV Vmax:        2.20 m/s MV Mean grad:  5.0 mmHg MV Vmax:       1.76 m/s     SHUNTS MV Vmean:      105.0 cm/s   Systemic VTI:  0.18 m MV Decel Time: 317 msec     Systemic Diam: 1.70 cm MV E velocity: 95.10 cm/s MV A velocity: 143.00 cm/s MV E/A ratio:  0.67 Neoma Laming MD Electronically signed by Neoma Laming MD Signature Date/Time: 07/30/2019/8:43:59 AM    Final      Scheduled Meds: . aspirin EC  81 mg Oral Daily  . Chlorhexidine Gluconate Cloth  6 each Topical Daily  . docusate sodium  100 mg Oral BID  . enalapril  2.5 mg Oral Daily  .  enoxaparin (LOVENOX) injection  40 mg Subcutaneous Q24H  . feeding supplement (ENSURE ENLIVE)  237 mL Oral BID BM  . metoprolol succinate  25 mg Oral Daily  . multivitamin with minerals  1 tablet Oral Daily  . pneumococcal 23 valent vaccine  0.5 mL Intramuscular Tomorrow-1000   Continuous Infusions: . sodium chloride Stopped (07/29/19 0711)  . methocarbamol (ROBAXIN) IV      Principal Problem:   Fracture, proximal femur, left, closed, initial encounter ([place]) Active Problems:   CAD (coronary artery disease)   Essential hypertension   Dementia without behavioral disturbance ([place])   Accidental fall   Preoperative clearance   Closed left hip fracture, initial encounter ([place])     Mattie Novosel Derek Jack, [facility]  If 7PM-7AM, please contact night-coverage [email or web address] Password [email or web address] 07/30/2019, 3:14 PM    LOS: 5 days

## 2019-07-30 NOTE — Care Management Important Message (Signed)
Important Message  Patient Details  Name: EVERETT ASARO MRN: [id number] Date of Birth: 06-Nov-1921   Medicare Important Message Given:  Yes  Given to bedside RN to give to pt-son discussed with on telephone   Elease Hashimoto, LCSW 07/30/2019, 10:52 AM

## 2019-07-30 NOTE — Progress Notes (Addendum)
Physical Therapy Treatment Patient Details Name: [name] [name] MRN: [id number] DOB: 01/21/1922 Today's Date: 07/30/2019    History of Present Illness 86 year old independent living female with full-time caregiver who has PMH significant for HTN, CAD and dementia with sundowning is admitted 07/25/2019 with left hip fracture status post mechanical fall, s/p IM nailing (07/26/19), WBAT.  Preop evaluation revealed positive Covid PCR.  Patient had no oxygen requirement and chest x-ray was without acute process.    PT Comments    Pt ready for session.  RN in room and stated pt is needing to use commode.  Assisted with care.  OOB with mod a x 2.  She initiates movements but is unable to complete on her own and needs extensive assist.  Once sitting, she is able to sit with min a x 1.  Max a/dependant stand pivot transfer to commode.  She is able to void.  Attempted to stand with RW for care but she is unable to stand completely today despite max a.  Stood pt with max a/dependant without RW for care than transitioned back to bed with max a x 2.  Participated in exercises as described below.   Pt with increased difficulty with standing and transfers today.  Per prior PT note anticipated progression to some limited gait today but it was not feasible.  Will continue with current goals and increase mobility as appropriate.  RN in room and agreed she was having increased difficulty with mobility today vs pervious encounters.  She does put in good effort with session and does quite well with ex.    Follow Up Recommendations  SNF     Equipment Recommendations  Rolling walker with 5" wheels;3in1 (PT)    Recommendations for Other Services       Precautions / Restrictions Precautions Precautions: Fall Restrictions Weight Bearing Restrictions: Yes LLE Weight Bearing: Weight bearing as tolerated    Mobility  Bed Mobility Overal bed mobility: Needs Assistance Bed Mobility: Supine to Sit;Sit to Supine      Supine to sit: +2 for physical assistance;Max assist Sit to supine: +2 for physical assistance;Max assist      Transfers Overall transfer level: Needs assistance Equipment used: Rolling walker (2 wheeled);None Transfers: Sit to/from [facility] Sit to Stand: Max assist;Total assist Stand pivot transfers: Max assist;Total assist       General transfer comment: unable to transfer or stand effectively with RW for care after voiding or for a safe transfer today  Ambulation/Gait             General Gait Details: unsafe/unable; limited tolerance for L LE WBing   Stairs             Wheelchair Mobility    Modified Rankin (Stroke Patients Only)       Balance Overall balance assessment: Needs assistance Sitting-balance support: No upper extremity supported;Feet supported Sitting balance-Leahy Scale: Fair Sitting balance - Comments: maintains with close sup for periods of time; R lean to offset L hip Postural control: Right lateral lean Standing balance support: Bilateral upper extremity supported Standing balance-Leahy Scale: Zero Standing balance comment: uanble to stand with RW today for static care.                            Cognition Arousal/Alertness: Awake/alert Behavior During Therapy: WFL for tasks assessed/performed Overall Cognitive Status: No family/caregiver present to determine baseline cognitive functioning  General Comments: Hx of dementia c sundowning. Follows commands, pleasant and cooperative      Exercises Other Exercises Other Exercises: BLE ankle pumps, heel slides, ab/add x 10 AROM RLE, AAROM LLE Other Exercises: to commode to void    General Comments        Pertinent Vitals/Pain Pain Assessment: Faces Faces Pain Scale: Hurts a little bit Pain Location: L hip Pain Descriptors / Indicators: Grimacing;Guarding Pain Intervention(s): Limited activity within  patient's tolerance    Home Living                      Prior Function            PT Goals (current goals can now be found in the care plan section) Progress towards PT goals: Progressing toward goals    Frequency    7X/week      PT Plan Current plan remains appropriate    Co-evaluation              AM-PAC PT "6 Clicks" Mobility   Outcome Measure  Help needed turning from your back to your side while in a flat bed without using bedrails?: A Lot Help needed moving from lying on your back to sitting on the side of a flat bed without using bedrails?: A Lot Help needed moving to and from a bed to a chair (including a wheelchair)?: Total Help needed standing up from a chair using your arms (e.g., wheelchair or bedside chair)?: Total Help needed to walk in hospital room?: Total Help needed climbing 3-5 steps with a railing? : Total 6 Click Score: 8    End of Session Equipment Utilized During Treatment: Gait belt Activity Tolerance: Patient limited by fatigue Patient left: in bed;with call bell/phone within reach;with bed alarm set;Other (comment) Nurse Communication: Mobility status Pain - Right/Left: Left Pain - part of body: Hip     Time: [id number] PT Time Calculation (min) (ACUTE ONLY): 42 min  Charges:  $Therapeutic Exercise: 8-22 mins $Therapeutic Activity: 23-37 mins                    Natalija Orfanos, PTA 07/30/19, 2:57 PM

## 2019-07-30 NOTE — Progress Notes (Signed)
SUBJECTIVE: Patient has Covid and is in isolation.   Vitals:   07/29/19 1500 07/29/19 1600 07/29/19 1700 07/30/19 0800  BP: 131/65 (!) 127/52 (!) 131/55 (!) 150/58  Pulse: 82 85 83 85  Resp: 19 18 19 15   Temp:   97.6 F (36.4 C) (!) 97.2 F (36.2 C)  TempSrc:   Axillary Axillary  SpO2: 99% 96% 96% 96%  Weight:      Height:        Intake/Output Summary (Last 24 hours) at 07/30/2019 0853 Last data filed at 07/30/2019 0819 Gross per 24 hour  Intake 240 ml  Output 700 ml  Net -460 ml    LABS: Basic Metabolic Panel: Recent Labs    07/29/19 0607 07/30/19 0600  NA 141 142  K 3.3* 3.8  CL 108 108  CO2 28 28  GLUCOSE 97 81  BUN 23 26*  CREATININE 0.78 0.75  CALCIUM 8.2* 8.3*   Liver Function Tests: No results for input(s): AST, ALT, ALKPHOS, BILITOT, PROT, ALBUMIN in the last 72 hours. No results for input(s): LIPASE, AMYLASE in the last 72 hours. CBC: Recent Labs    07/28/19 0507 07/29/19 0607  WBC 14.8* 13.7*  HGB 9.0* 8.6*  HCT 26.4* 25.8*  MCV 92.3 94.9  PLT 144* 159   Cardiac Enzymes: No results for input(s): CKTOTAL, CKMB, CKMBINDEX, TROPONINI in the last 72 hours. BNP: Invalid input(s): POCBNP D-Dimer: No results for input(s): DDIMER in the last 72 hours. Hemoglobin A1C: No results for input(s): HGBA1C in the last 72 hours. Fasting Lipid Panel: No results for input(s): CHOL, HDL, LDLCALC, TRIG, CHOLHDL, LDLDIRECT in the last 72 hours. Thyroid Function Tests: No results for input(s): TSH, T4TOTAL, T3FREE, THYROIDAB in the last 72 hours.  Invalid input(s): FREET3 Anemia Panel: No results for input(s): VITAMINB12, FOLATE, FERRITIN, TIBC, IRON, RETICCTPCT in the last 72 hours.   PHYSICAL EXAM General: Well developed, well nourished, in no acute distress HEENT:  Normocephalic and atramatic Neck:  No JVD.  Lungs: Clear bilaterally to auscultation and percussion. Heart: HRRR . Normal S1 and S2 without gallops or murmurs.  Abdomen: Bowel sounds are  positive, abdomen soft and non-tender  Msk:  Back normal, normal gait. Normal strength and tone for age. Extremities: No clubbing, cyanosis or edema.   Neuro: Alert and oriented X 3. Psych:  Good affect, responds appropriately  TELEMETRY: Sinus rhythm  ASSESSMENT AND PLAN: Hip fracture status post surgery and possible inferior wall MI but has recovered and no significant LV dysfunction with normal ejection fraction on echocardiogram.  Advise proceeding with physical therapy if needed. Principal Problem:   Fracture, proximal femur, left, closed, initial encounter ([place]) Active Problems:   CAD (coronary artery disease)   Essential hypertension   Dementia without behavioral disturbance ([place])   Accidental fall   Preoperative clearance   Closed left hip fracture, initial encounter ([place])    Neoma Laming A, MD, [facility] 07/30/2019 8:53 AM    F2

## 2019-07-30 NOTE — NC FL2 (Signed)
[place] LEVEL OF CARE SCREENING TOOL     IDENTIFICATION  Patient Name: [name] [name] Birthdate: 03-30-1922 Sex: female Admission Date (Current Location): 07/25/2019  [place] and [place] Number:  Engineering geologist and Address:  [facility], [address], [place], [place] [address]      Provider Number: [id number]  Attending Physician Name and Address:  Oren Binet*  Relative Name and Phone Number:  [name] [phone]    Current Level of Care: Hospital Recommended Level of Care: [place] Prior Approval Number:    Date Approved/Denied:   PASRR Number: [id number]  Discharge Plan: SNF    Current Diagnoses: Patient Active Problem List   Diagnosis Date Noted  . Fracture, proximal femur, left, closed, initial encounter ([place]) 07/25/2019  . CAD (coronary artery disease) 07/25/2019  . Essential hypertension 07/25/2019  . Dementia without behavioral disturbance ([place]) 07/25/2019  . Accidental fall 07/25/2019  . Preoperative clearance 07/25/2019  . Closed left hip fracture, initial encounter ([place]) 07/25/2019  . Chest pain 10/23/2017    Orientation RESPIRATION BLADDER Height & Weight     Self  Normal Incontinent Weight: 125 lb (56.7 kg) Height:  5\' 6"  (167.6 cm)  BEHAVIORAL SYMPTOMS/MOOD NEUROLOGICAL BOWEL NUTRITION STATUS    (Dementia) Incontinent Diet(Dys 3 nectar thick)  AMBULATORY STATUS COMMUNICATION OF NEEDS Skin   Extensive Assist Verbally Surgical wounds                       Personal Care Assistance Level of Assistance  Bathing, Feeding, Dressing Bathing Assistance: Limited assistance Feeding assistance: Limited assistance Dressing Assistance: Limited assistance     Functional Limitations Info  Sight, Hearing, Speech Sight Info: Adequate Hearing Info: Adequate Speech Info: Adequate    SPECIAL CARE FACTORS FREQUENCY  PT (By licensed PT), OT (By licensed OT)     PT  Frequency: 5x week OT Frequency: 5x week            Contractures Contractures Info: Not present    Additional Factors Info  Code Status, Allergies Code Status Info: DNR Allergies Info: NKDA           Current Medications (07/30/2019):  This is the current hospital active medication list Current Facility-Administered Medications  Medication Dose Route Frequency Provider Last Rate Last Admin  . 0.9 %  sodium chloride infusion   Intravenous Continuous Vashti Hey, MD   Stopped at 07/29/19 [phone]  . aspirin EC tablet 81 mg  81 mg Oral Daily Benita Gutter, RPH   81 mg at 07/29/19 1056  . bisacodyl (DULCOLAX) suppository 10 mg  10 mg Rectal Daily PRN Leim Fabry, MD      . Chlorhexidine Gluconate Cloth 2 % PADS 6 each  6 each Topical Daily Vashti Hey, MD   6 each at 07/26/19 2200  . docusate sodium (COLACE) capsule 100 mg  100 mg Oral BID Leim Fabry, MD   100 mg at 07/29/19 2153  . enalapril (VASOTEC) tablet 2.5 mg  2.5 mg Oral Daily Neoma Laming A, MD   2.5 mg at 07/29/19 1056  . enoxaparin (LOVENOX) injection 40 mg  40 mg Subcutaneous Q24H Leim Fabry, MD   40 mg at 07/29/19 1056  . feeding supplement (ENSURE ENLIVE) (ENSURE ENLIVE) liquid 237 mL  237 mL Oral BID BM Bonnell Public Tublu, MD   237 mL at 07/28/19 1137  . haloperidol (HALDOL) 2 MG/ML solution 1 mg  1 mg  Oral Q8H PRN Bonnell Public Tublu, MD      . HYDROmorphone (DILAUDID) injection 0.25-0.5 mg  0.25-0.5 mg Intravenous Q2H PRN Leim Fabry, MD      . methocarbamol (ROBAXIN) tablet 500 mg  500 mg Oral Q6H PRN Leim Fabry, MD       Or  . methocarbamol (ROBAXIN) 500 mg in dextrose 5 % 50 mL IVPB  500 mg Intravenous Q6H PRN Leim Fabry, MD      . metoCLOPramide (REGLAN) tablet 5-10 mg  5-10 mg Oral Q8H PRN Leim Fabry, MD       Or  . metoCLOPramide (REGLAN) injection 5-10 mg  5-10 mg Intravenous Q8H PRN Leim Fabry, MD      . metoprolol succinate (TOPROL-XL) 24 hr tablet 25 mg   25 mg Oral Daily Neoma Laming A, MD   25 mg at 07/29/19 1056  . multivitamin with minerals tablet 1 tablet  1 tablet Oral Daily Vashti Hey, MD   1 tablet at 07/29/19 1056  . ondansetron (ZOFRAN) tablet 4 mg  4 mg Oral Q6H PRN Leim Fabry, MD       Or  . ondansetron [facility]) injection 4 mg  4 mg Intravenous Q6H PRN Leim Fabry, MD      . oxyCODONE (Oxy IR/ROXICODONE) immediate release tablet 2.5 mg  2.5 mg Oral Q4H PRN Bonnell Public Tublu, MD      . pneumococcal 23 valent vaccine (PNEUMOVAX-23) injection 0.5 mL  0.5 mL Intramuscular Tomorrow-1000 Bonnell Public Tublu, MD      . senna-docusate (Senokot-S) tablet 1 tablet  1 tablet Oral QHS PRN Leim Fabry, MD      . sodium phosphate (FLEET) 7-19 GM/118ML enema 1 enema  1 enema Rectal Once PRN Leim Fabry, MD      . traMADol Veatrice Bourbon) tablet 50 mg  50 mg Oral Q6H PRN Leim Fabry, MD         Discharge Medications: Please see discharge summary for a list of discharge medications.  Relevant Imaging Results:  Relevant Lab Results:   Additional Information SSN: [id number]  Sharene Krikorian, Gardiner Rhyme, LCSW

## 2019-07-31 LAB — CBC
HCT: 23.5 % — ABNORMAL LOW (ref 36.0–46.0)
Hemoglobin: 7.8 g/dL — ABNORMAL LOW (ref 12.0–15.0)
MCH: 31.2 pg (ref 26.0–34.0)
MCHC: 33.2 g/dL (ref 30.0–36.0)
MCV: 94 fL (ref 80.0–100.0)
Platelets: 161 10*3/uL (ref 150–400)
RBC: 2.5 MIL/uL — ABNORMAL LOW (ref 3.87–5.11)
RDW: 13.2 % (ref 11.5–15.5)
WBC: 10.1 10*3/uL (ref 4.0–10.5)
nRBC: 0 % (ref 0.0–0.2)

## 2019-07-31 NOTE — TOC Progression Note (Addendum)
Transition of Care [facility]) - Progression Note    Patient Details  Name: PATRISHA MERTES MRN: [id number] Date of Birth: 04/25/1922  Transition of Care [facility]) CM/SW Contact  Caylen Yardley, Gardiner Rhyme, LCSW Phone Number: 07/31/2019, 2:29 PM  Clinical Narrative:   Have left a message for Daysha-Adm at the [facility] in [place] who is currently taking COVID pt's. Awaiting her return call, so information can be faxed to her.   3:00 Pm have reached out to management to assist with options for this pt to go to a SNF. [facility] may be able to take once 10 days has passed and they will work on [place] now in anticipation of possible transfer 4/9.   3:42 Pm Have spoken with [place] [facility] and will fax information for acceptance.  Expected Discharge Plan: [place]    Expected Discharge Plan and Services Expected Discharge Plan: [place] arrangements for the past 2 months: Single Family Home                                       Social Determinants of Health (SDOH) Interventions    Readmission Risk Interventions No flowsheet data found.

## 2019-07-31 NOTE — Progress Notes (Signed)
Subjective: 5 Days Post-Op Procedure(s) (LRB): INTRAMEDULLARY (IM) NAIL INTERTROCHANTRIC (Left) Patient denies any left hip pain. Patient is well, and has had no acute complaints or problems We will continue therapy today.   Objective: Vital signs in last 24 hours: Temp:  [97.5 F (36.4 C)-98.8 F (37.1 C)] 98.8 F (37.1 C) (04/06 1520) Pulse Rate:  [62-72] 72 (04/06 0040) Resp:  [16-20] 20 (04/06 0040) BP: (114-128)/(40-59) 114/40 (04/06 0807) SpO2:  [96 %] 96 % (04/06 0040)  Intake/Output from previous day: 04/05 0701 - 04/06 0700 In: -  Out: 450 [Urine:450] Intake/Output this shift: Total I/O In: 346.6 [P.O.:240; I.V.:106.6] Out: -   Recent Labs    07/29/19 0607 07/31/19 0628  HGB 8.6* 7.8*   Recent Labs    07/29/19 0607 07/31/19 0628  WBC 13.7* 10.1  RBC 2.72* 2.50*  HCT 25.8* 23.5*  PLT 159 161   Recent Labs    07/29/19 0607 07/30/19 0600  NA 141 142  K 3.3* 3.8  CL 108 108  CO2 28 28  BUN 23 26*  CREATININE 0.78 0.75  GLUCOSE 97 81  CALCIUM 8.2* 8.3*   No results for input(s): LABPT, INR in the last 72 hours.  EXAM General - Patient is Alert and Appropriate Extremity - Sensation intact distally Intact pulses distally No cellulitis present Compartment soft Dressing - dressing C/D/I and no drainage Motor Function - intact, moving foot and toes well on exam.   Past Medical History:  Diagnosis Date  . Cancer ([place])    breast  . Chronic kidney disease   . Coronary artery disease   . GERD (gastroesophageal reflux disease)   . Gout   . HH (hiatus hernia)   . Hypercholesteremia   . Hypertension   . Murmur   . Palpitations     Assessment/Plan:   5 Days Post-Op Procedure(s) (LRB): INTRAMEDULLARY (IM) NAIL INTERTROCHANTRIC (Left) Principal Problem:   Fracture, proximal femur, left, closed, initial encounter ([place]) Active Problems:   CAD (coronary artery disease)   Essential hypertension   Dementia without behavioral disturbance ([place])   Accidental fall   Preoperative clearance   Closed left hip fracture, initial encounter ([place])  Estimated body mass index is 20.18 kg/m as calculated from the following:   Height as of this encounter: 5\' 6"  (1.676 m).   Weight as of this encounter: 56.7 kg. Advance diet Up with therapy, weightbearing as tolerated left lower extremity  Needs bowel movement Labs reviewed this AM, Hg 7.8 this morning, continue to monitor.  Repeat Hg tomorrow morning.  At discharge, skilled nursing facility to remove staples and apply Steri-Strips on 08/10/2019 TED hose bilateral lower extremity x6 weeks Lovenox 40 mg subcu daily x4 weeks  DVT Prophylaxis - Lovenox, TED hose and SCDs Weight-Bearing as tolerated to left leg  J. Cameron Proud, PA-C [place] 07/31/2019, 3:45 PM

## 2019-07-31 NOTE — TOC Progression Note (Addendum)
Transition of Care [facility]) - Progression Note    Patient Details  Name: AUNYE NANNEY MRN: [id number] Date of Birth: [birth year]  Transition of Care [facility]) CM/SW Contact  Shane Badeaux, Gardiner Rhyme, LCSW Phone Number: 07/31/2019, 10:21 AM  Clinical Narrative:  Spoke with Karen-Daughter in-law regarding the possibility of going home with caregivers. She is afraid pt will not do well in a facility not allowing visitors. She was being followed by [facility] and have reached out to [place] with them to discuss additional services and equipment. She will reach out to daughter in-law. Santiago Glad wants to still pursue NH in [place] in case needed. Will work on both options and see which one family chooses.  11:18 AM Spoke with daughter in-law who reports their caregivers have taken other assignments and currently she has no caregivers. She is looking into this and will continue to look into SNF in [place]  Expected Discharge Plan: [place]    Expected Discharge Plan and Services Expected Discharge Plan: [place] arrangements for the past 2 months: Single Family Home                                       Social Determinants of Health (SDOH) Interventions    Readmission Risk Interventions No flowsheet data found.

## 2019-07-31 NOTE — Progress Notes (Signed)
PROGRESS NOTE    [name] [name]  [id number]  DOB: January 05, 1922  DOA: 07/25/2019 PCP: Jodi Marble, MD Outpatient Specialists:   Hospital course:  86 year old independent living female with full-time caregiver who has PMH significant for HTN, CAD and dementia with sundowning is admitted 07/25/2019 with left hip fracture status post mechanical fall.  Preop evaluation revealed positive Covid PCR.  Patient had no oxygen requirement and chest x-ray was without acute process.  Subjective:  Patient states she feels like she does not know why she still here.  She does remember that she has had a hip fracture.  I reminded her that she had Covid infection.  Patient does not seem to understand what that means.  Patient does remember trying to defecate yesterday but notes she was not able to.  States she does not have the urge today.  Objective: Vitals:   07/30/19 1600 07/31/19 0040 07/31/19 0807 07/31/19 1520  BP: (!) 121/40 (!) 128/59 (!) 114/40   Pulse: 62 72    Resp: 16 20    Temp: (!) 97.5 F (36.4 C) 98.4 F (36.9 C) 98.4 F (36.9 C) 98.8 F (37.1 C)  TempSrc: Axillary Oral Axillary Axillary  SpO2: 96% 96%    Weight:      Height:        Intake/Output Summary (Last 24 hours) at 07/31/2019 1650 Last data filed at 07/31/2019 1022 Gross per 24 hour  Intake 346.62 ml  Output --  Net 346.62 ml   Filed Weights   07/25/19 1827  Weight: 56.7 kg     Assessment & Plan:   86 year old independent living female with full-time caregiver and history of HTN, CAD and dementia with sundowning presents with left hip fracture after mechanical fall.  She is found to be Covid positive with negative chest x-ray no hypoxia.  Lethargy Patient's energy waxes and wanes over the course of several days, likely because of her age. Appreciate dietary consult  Constipation Patient declined Dulcolax suppository yesterday, will continue to work with nurses to treat constipation if  warranted.  Leukocytosis Resolving possibly secondary to constipation Patient remains afebrile UA is negative Patient has no oxygen requirement and chest x-ray remains negative Abdomen is soft  Anemia Patient had a progressive decline in her H&H since her surgery Will repeat CBC again tomorrow. Patient remains hemodynamically stable..  Left hip fracture, proximal femur fracture POD #5 status post pinning PT OT are working with patient. Pain management and DVT prophylaxis per orthopedics Awaiting discharge to a Covid positive rehab bed.  COVID-19 infection Chest x-ray is without acute abnormalities, patient is not hypoxic. We will not start remdesivir as patient does not really have severe disease as per CDC/WHO guidelines. No indication for steroids given no hypoxia.  CAD (coronary artery disease) Does not appear to be on any CAD medications at home. Does not appear to be on any antiplatelets at home.  HTN She is relatively normotensive, does not appear to be on any BP meds at home.  Dementia Reorder Haldol per home regimen however will make it every 8 as needed rather than twice daily standing.   DVT prophylaxis: SCD Code Status: DNR Family Communication: Spoke with son Estelene [place], he is very realistic about patient's age and prognosis. Disposition Plan:   Patient is from: Home  Anticipated Discharge Location: Rehab  Barriers to Discharge: Looking for Covid rehab bed  Is patient medically stable for Discharge: Yes   Consultants:  Orthopedics  Cardiology for preoperative  evaluation.  Procedures:  Left intertrochanteric/intramedullary nail  Antimicrobials:  None   Exam:  General: Thin pleasant female lying in bed in no acute distress, smiling, conversant. Eyes: sclera anicteric, conjuctiva mild injection bilaterally CVS: S1-S2, regular  Respiratory:  decreased air entry bilaterally secondary to decreased inspiratory effort, rales at bases  GI:  Emaciated, scaphoid, positive bowel sounds.  Nontender. LE: No edema.  Neuro: A/O x 3, Moving all extremities equally with normal strength, CN 3-12 intact, grossly nonfocal.    Data Reviewed: Basic Metabolic Panel: Recent Labs  Lab 07/26/19 0505 07/27/19 0503 07/28/19 0507 07/29/19 0607 07/30/19 0600  NA 142 143 141 141 142  K 4.0 3.8 3.6 3.3* 3.8  CL 102 108 109 108 108  CO2 28 24 29 28 28   GLUCOSE 136* 116* 92 97 81  BUN 19 25* 27* 23 26*  CREATININE 1.03* 0.96 0.93 0.78 0.75  CALCIUM 9.2 8.6* 8.3* 8.2* 8.3*   Liver Function Tests: Recent Labs  Lab 07/25/19 1818 07/27/19 0503  AST 19 16  ALT 11 11  ALKPHOS 69 57  BILITOT 1.3* 1.2  PROT 6.3* 5.2*  ALBUMIN 2.8* 2.4*   No results for input(s): LIPASE, AMYLASE in the last 168 hours. No results for input(s): AMMONIA in the last 168 hours. CBC: Recent Labs  Lab 07/25/19 1818 07/25/19 1818 07/26/19 0505 07/27/19 0503 07/28/19 0507 07/29/19 0607 07/31/19 0628  WBC 7.2   < > 15.6* 17.6* 14.8* 13.7* 10.1  NEUTROABS 5.2  --   --   --   --   --   --   HGB 13.4   < > 12.5 10.8* 9.0* 8.6* 7.8*  HCT 40.1   < > 37.4 33.0* 26.4* 25.8* 23.5*  MCV 94.1   < > 93.5 95.4 92.3 94.9 94.0  PLT 195   < > 194 145* 144* 159 161   < > = values in this interval not displayed.   Cardiac Enzymes: No results for input(s): CKTOTAL, CKMB, CKMBINDEX, TROPONINI in the last 168 hours. BNP (last 3 results) No results for input(s): PROBNP in the last 8760 hours. CBG: No results for input(s): GLUCAP in the last 168 hours.  Recent Results (from the past 240 hour(s))  Respiratory Panel by RT PCR (Flu A&B, Covid) - Nasopharyngeal Swab     Status: Abnormal   Collection Time: 07/25/19  7:28 PM   Specimen: Nasopharyngeal Swab  Result Value Ref Range Status   SARS Coronavirus 2 by RT PCR POSITIVE (A) NEGATIVE Final    Comment: RESULT CALLED TO, READ BACK BY AND VERIFIED WITH: KATIE FERGUSON @ 2120 ON 07/25/2019 BY CAF (NOTE) SARS-CoV-2 target  nucleic acids are DETECTED. SARS-CoV-2 RNA is generally detectable in upper respiratory specimens  during the acute phase of infection. Positive results are indicative of the presence of the identified virus, but do not rule out bacterial infection or co-infection with other pathogens not detected by the test. Clinical correlation with patient history and other diagnostic information is necessary to determine patient infection status. The expected result is Negative. Fact Sheet for Patients:  PinkCheek.be Fact Sheet for Healthcare Providers: GravelBags.it This test is not yet approved or cleared by the [place] FDA and  has been authorized for detection and/or diagnosis of SARS-CoV-2 by FDA under an Emergency Use Authorization (EUA).  This EUA will remain in effect (meaning this test can be u sed) for the duration of  the COVID-19 declaration under Section 564(b)(1) of the Act, 21 U.S.C. section  360bbb-3(b)(1), unless the authorization is terminated or revoked sooner.    Influenza A by PCR NEGATIVE NEGATIVE Final   Influenza B by PCR NEGATIVE NEGATIVE Final    Comment: (NOTE) The Xpert Xpress SARS-CoV-2/FLU/RSV assay is intended as an aid in  the diagnosis of influenza from Nasopharyngeal swab specimens and  should not be used as a sole basis for treatment. Nasal washings and  aspirates are unacceptable for Xpert Xpress SARS-CoV-2/FLU/RSV  testing. Fact Sheet for Patients: PinkCheek.be Fact Sheet for Healthcare Providers: GravelBags.it This test is not yet approved or cleared by the [place] FDA and  has been authorized for detection and/or diagnosis of SARS-CoV-2 by  FDA under an Emergency Use Authorization (EUA). This EUA will remain  in effect (meaning this test can be used) for the duration of the  Covid-19 declaration under Section 564(b)(1) of the Act,  21  U.S.C. section 360bbb-3(b)(1), unless the authorization is  terminated or revoked. Performed at [facility], [address], [place], [place] [address]       Studies: No results found.   Scheduled Meds: . aspirin EC  81 mg Oral Daily  . Chlorhexidine Gluconate Cloth  6 each Topical Daily  . docusate sodium  100 mg Oral BID  . enalapril  2.5 mg Oral Daily  . enoxaparin (LOVENOX) injection  40 mg Subcutaneous Q24H  . feeding supplement (ENSURE ENLIVE)  237 mL Oral BID BM  . metoprolol succinate  25 mg Oral Daily  . multivitamin with minerals  1 tablet Oral Daily  . pneumococcal 23 valent vaccine  0.5 mL Intramuscular Tomorrow-1000   Continuous Infusions: . sodium chloride 100 mL/hr at 07/31/19 1016  . methocarbamol (ROBAXIN) IV      Principal Problem:   Fracture, proximal femur, left, closed, initial encounter ([place]) Active Problems:   CAD (coronary artery disease)   Essential hypertension   Dementia without behavioral disturbance ([place])   Accidental fall   Preoperative clearance   Closed left hip fracture, initial encounter ([place])     Shanty Ginty Derek Jack, [facility]  If 7PM-7AM, please contact night-coverage [email or web address] Password [email or web address] 07/31/2019, 4:50 PM    LOS: 6 days

## 2019-07-31 NOTE — Progress Notes (Signed)
Physical Therapy Treatment Patient Details Name: [name] [name] MRN: [id number] DOB: 1922/04/14 Today's Date: 07/31/2019    History of Present Illness 86 year old independent living female with full-time caregiver who has PMH significant for HTN, CAD and dementia with sundowning is admitted 07/25/2019 with left hip fracture status post mechanical fall, s/p IM nailing (07/26/19), WBAT.  Preop evaluation revealed positive Covid PCR.  Patient had no oxygen requirement and chest x-ray was without acute process.    PT Comments    Pt was long sitting in bed upon arriving. She greets therapist," I've been waiting on you to come see me." This is first encounter with therapist and pt. She is however A and O x 3. Knows she is in hospital and able to correctly describe injuries. She agrees to OOB activity. Pt required Max assist to exit R side of bed with increased time and vcs for sequencing and technique. She was able to stand 3 x EOB with max assist at first progressing to mod assist from elevated bed height. Vcs for technique and safety throughout with use of gait belt and RW for equipment. She has poor standing posture at first but with cueing is able to correct. She was able to clear BLEs off floor with mod assist. Took 3 steps to [facility] from FOB with increased time and vcs. Pt was repositioned in bed post session with call bell in reach, bed alarm in place, and pt drinking thickened liquids. PT continues to recommend DC to SNF when medically stable to address strength, balance, and safe functional mobility deficits.     Follow Up Recommendations  SNF     Equipment Recommendations  Rolling walker with 5" wheels;3in1 (PT)    Recommendations for Other Services       Precautions / Restrictions Precautions Precautions: Fall Restrictions Weight Bearing Restrictions: Yes LLE Weight Bearing: Weight bearing as tolerated    Mobility  Bed Mobility Overal bed mobility: Needs Assistance Bed Mobility:  Supine to Sit;Sit to Supine     Supine to sit: HOB elevated;Max assist Sit to supine: Max assist;HOB elevated   General bed mobility comments: Pt was able to exit R side of bed with max assist + increased time. therapist assist BLEs + assisted with trunk support. Pt tolerated well without c/o increased pain  Transfers Overall transfer level: Needs assistance Equipment used: Rolling walker (2 wheeled);None Transfers: Sit to/from Stand Sit to Stand: From elevated surface;Mod assist;Max assist         General transfer comment: Pt was able to stand 3 x EOB with max assist on first trial but progressed to mod assist on 2nd/3rd trial. Vcs for hand placement and technique. Bed was elevated.   Ambulation/Gait Ambulation/Gait assistance: Mod assist Gait Distance (Feet): 3 Feet Assistive device: Rolling walker (2 wheeled) Gait Pattern/deviations: Step-to pattern Gait velocity: decreased   General Gait Details: Pt was able to march in place 10 x LLE and 5 x with RLE however slight knee buckling on LLE. she took 3 steps from FOB to [facility]. vcs throughout for posture correction and improved safety.   Stairs             Wheelchair Mobility    Modified Rankin (Stroke Patients Only)       Balance Overall balance assessment: Needs assistance Sitting-balance support: No upper extremity supported;Feet supported Sitting balance-Leahy Scale: Fair     Standing balance support: Bilateral upper extremity supported Standing balance-Leahy Scale: Poor Standing balance comment: pt was able to stand with  BUE support 3 x with unsteadiness noted throughout.                             Cognition Arousal/Alertness: Awake/alert Behavior During Therapy: WFL for tasks assessed/performed Overall Cognitive Status: Difficult to assess                                 General Comments: Pt was Alert and oriented x 3. She was able to follow commands consistently and was very  motivated to improve      Exercises Total Joint Exercises Ankle Circles/Pumps: AROM;20 reps;Supine Quad Sets: AROM;10 reps;Supine Gluteal Sets: AROM;10 reps Heel Slides: AAROM;Left;15 reps;Supine Hip ABduction/ADduction: AAROM;Left;15 reps;Supine    General Comments        Pertinent Vitals/Pain Pain Assessment: 0-10 Pain Score: 4  Faces Pain Scale: Hurts a little bit Pain Location: L hip Pain Descriptors / Indicators: Grimacing;Guarding Pain Intervention(s): Limited activity within patient's tolerance;Monitored during session;Repositioned    Home Living                      Prior Function            PT Goals (current goals can now be found in the care plan section) Acute Rehab PT Goals Patient Stated Goal: To go home Progress towards PT goals: Progressing toward goals    Frequency    7X/week      PT Plan Current plan remains appropriate    Co-evaluation     PT goals addressed during session: Mobility/safety with mobility        AM-PAC PT "6 Clicks" Mobility   Outcome Measure  Help needed turning from your back to your side while in a flat bed without using bedrails?: A Lot Help needed moving from lying on your back to sitting on the side of a flat bed without using bedrails?: A Lot Help needed moving to and from a bed to a chair (including a wheelchair)?: A Lot Help needed standing up from a chair using your arms (e.g., wheelchair or bedside chair)?: A Lot Help needed to walk in hospital room?: A Lot Help needed climbing 3-5 steps with a railing? : Total 6 Click Score: 11    End of Session Equipment Utilized During Treatment: Gait belt Activity Tolerance: Patient tolerated treatment well Patient left: in bed;with call bell/phone within reach;with bed alarm set;Other (comment) Nurse Communication: Mobility status PT Visit Diagnosis: Muscle weakness (generalized) (M62.81);Difficulty in walking, not elsewhere classified (R26.2);Pain Pain -  Right/Left: Left Pain - part of body: Hip     Time: 1041-1105 PT Time Calculation (min) (ACUTE ONLY): 24 min  Charges:  $Therapeutic Exercise: 8-22 mins $Therapeutic Activity: 8-22 mins                     Julaine Fusi PTA 07/31/19, 11:23 AM

## 2019-07-31 NOTE — TOC Progression Note (Addendum)
Transition of Care [facility]) - Progression Note    Patient Details  Name: STEPHANNY HOCTOR MRN: [id number] Date of Birth: 04-16-1922  Transition of Care [facility]) CM/SW Contact  Olukemi Panchal, Gardiner Rhyme, LCSW Phone Number: 07/31/2019, 8:08 AM  Clinical Narrative:   Have left messages for [facility] and adm coordinator at [facility] along with [facility] in [place] trying to pursue bed offer. Son aware needed to expand search and will touch base with him today once hear back from two facilities.  8:34 am [facility] has closed their COVID unit. Message left at [facility] again  9:26 am Have left messages for [facility] along with [place] regarding SNF-COVID units. Son aware pt will be further away due to [place] and insurance issues.  Expected Discharge Plan: [place]    Expected Discharge Plan and Services Expected Discharge Plan: [place] arrangements for the past 2 months: Single Family Home                                       Social Determinants of Health (SDOH) Interventions    Readmission Risk Interventions No flowsheet data found.

## 2019-07-31 NOTE — Progress Notes (Signed)
SUBJECTIVE: Patient is comfortable   Vitals:   07/30/19 0800 07/30/19 1600 07/31/19 0040 07/31/19 0807  BP: (!) 150/58 (!) 121/40 (!) 128/59 (!) 114/40  Pulse: 85 62 72   Resp: 15 16 20    Temp: (!) 97.2 F (36.2 C) (!) 97.5 F (36.4 C) 98.4 F (36.9 C) 98.4 F (36.9 C)  TempSrc: Axillary Axillary Oral Axillary  SpO2: 96% 96% 96%   Weight:      Height:       No intake or output data in the 24 hours ending 07/31/19 0941  LABS: Basic Metabolic Panel: Recent Labs    07/29/19 0607 07/30/19 0600  NA 141 142  K 3.3* 3.8  CL 108 108  CO2 28 28  GLUCOSE 97 81  BUN 23 26*  CREATININE 0.78 0.75  CALCIUM 8.2* 8.3*   Liver Function Tests: No results for input(s): AST, ALT, ALKPHOS, BILITOT, PROT, ALBUMIN in the last 72 hours. No results for input(s): LIPASE, AMYLASE in the last 72 hours. CBC: Recent Labs    07/29/19 0607 07/31/19 0628  WBC 13.7* 10.1  HGB 8.6* 7.8*  HCT 25.8* 23.5*  MCV 94.9 94.0  PLT 159 161   Cardiac Enzymes: No results for input(s): CKTOTAL, CKMB, CKMBINDEX, TROPONINI in the last 72 hours. BNP: Invalid input(s): POCBNP D-Dimer: No results for input(s): DDIMER in the last 72 hours. Hemoglobin A1C: No results for input(s): HGBA1C in the last 72 hours. Fasting Lipid Panel: No results for input(s): CHOL, HDL, LDLCALC, TRIG, CHOLHDL, LDLDIRECT in the last 72 hours. Thyroid Function Tests: No results for input(s): TSH, T4TOTAL, T3FREE, THYROIDAB in the last 72 hours.  Invalid input(s): FREET3 Anemia Panel: No results for input(s): VITAMINB12, FOLATE, FERRITIN, TIBC, IRON, RETICCTPCT in the last 72 hours.   PHYSICAL EXAM General: Well developed, well nourished, in no acute distress HEENT:  Normocephalic and atramatic Neck:  No JVD.  Lungs: Clear bilaterally to auscultation and percussion. Heart: HRRR . Normal S1 and S2 without gallops or murmurs.  Abdomen: Bowel sounds are positive, abdomen soft and non-tender  Msk:  Back normal, normal gait.  Normal strength and tone for age. Extremities: No clubbing, cyanosis or edema.   Neuro: Alert and oriented X 3. Psych:  Good affect, responds appropriately  TELEMETRY: Sinus rhythm  ASSESSMENT AND PLAN: Patient probably had inferior wall STEMI with Covid infection and being 86 years of age and patient's wishes it was decided to treat the patient medically.  Patient is improving significantly and is having normal ejection fraction on echocardiogram with no significant wall motion abnormalities.  Thus medical treatment will be started with beta-blocker aspirin and is clinically doing fine.  Principal Problem:   Fracture, proximal femur, left, closed, initial encounter ([place]) Active Problems:   CAD (coronary artery disease)   Essential hypertension   Dementia without behavioral disturbance ([place])   Accidental fall   Preoperative clearance   Closed left hip fracture, initial encounter ([facility])    Neoma Laming A, MD, [facility] 07/31/2019 9:41 AM

## 2019-08-01 LAB — BASIC METABOLIC PANEL
Anion gap: 5 (ref 5–15)
BUN: 19 mg/dL (ref 8–23)
CO2: 27 mmol/L (ref 22–32)
Calcium: 7.7 mg/dL — ABNORMAL LOW (ref 8.9–10.3)
Chloride: 106 mmol/L (ref 98–111)
Creatinine, Ser: 0.56 mg/dL (ref 0.44–1.00)
GFR calc Af Amer: >60 mL/min (ref >60)
GFR calc non Af Amer: >60 mL/min (ref >60)
Glucose, Bld: 86 mg/dL (ref 70–99)
Potassium: 3.4 mmol/L — ABNORMAL LOW (ref 3.5–5.1)
Sodium: 138 mmol/L (ref 135–145)

## 2019-08-01 LAB — CBC
HCT: 25.7 % — ABNORMAL LOW (ref 36.0–46.0)
Hemoglobin: 8.5 g/dL — ABNORMAL LOW (ref 12.0–15.0)
MCH: 31.1 pg (ref 26.0–34.0)
MCHC: 33.1 g/dL (ref 30.0–36.0)
MCV: 94.1 fL (ref 80.0–100.0)
Platelets: 187 10*3/uL (ref 150–400)
RBC: 2.73 MIL/uL — ABNORMAL LOW (ref 3.87–5.11)
RDW: 13.3 % (ref 11.5–15.5)
WBC: 10.1 10*3/uL (ref 4.0–10.5)
nRBC: 0 % (ref 0.0–0.2)

## 2019-08-01 MED ORDER — POTASSIUM CHLORIDE 20 MEQ PO PACK
40.0000 meq | PACK | Freq: Two times a day (BID) | ORAL | Status: AC
  Administered 2019-08-01 – 2019-08-02 (×2): 40 meq via ORAL
  Filled 2019-08-01 (×2): qty 2

## 2019-08-01 NOTE — Progress Notes (Signed)
PROGRESS NOTE    [name] [name]  [id number]  DOB: 10-31-1921  DOA: 07/25/2019 PCP: Jodi Marble, MD Outpatient Specialists:   Hospital course:  86 year old independent living female with full-time caregiver who has PMH significant for HTN, CAD and dementia with sundowning is admitted 07/25/2019 with left hip fracture status post mechanical fall.  Preop evaluation revealed positive Covid PCR.  Patient had no oxygen requirement and chest x-ray was without acute process.  Subjective:  Patient is sitting up in chair when I enter and seems happy to see me.  She smiles and tells that her hand.  She states "I am lonely".  Then she says "I am afraid".  She does understand why she is here.  However she says she would like to go home.  She holds my hands and says "can you stay with me a little while".  Objective: Vitals:   07/31/19 0807 07/31/19 1520 08/01/19 0000 08/01/19 0931  BP: (!) 114/40  (!) 177/53 (!) 135/57  Pulse:   66 70  Resp:   18 16  Temp: 98.4 F (36.9 C) 98.8 F (37.1 C) 98.4 F (36.9 C)   TempSrc: Axillary Axillary Oral   SpO2:   95% 96%  Weight:      Height:        Intake/Output Summary (Last 24 hours) at 08/01/2019 1533 Last data filed at 07/31/2019 1800 Gross per 24 hour  Intake 763.33 ml  Output -  Net 763.33 ml   Filed Weights   07/25/19 1827  Weight: 56.7 kg     Assessment & Plan:   86 year old independent living female with full-time caregiver and history of HTN, CAD and dementia with sundowning presents with left hip fracture after mechanical fall.  She is found to be Covid positive with negative chest x-ray no hypoxia.  Lethargy Patient looks much better with much more energy today.  Constipation Patient declined Dulcolax suppository yesterday, will continue to work with nurses to treat constipation if warranted.  Leukocytosis Resolving possibly secondary to constipation Patient remains afebrile UA is negative Patient has no oxygen  requirement and chest x-ray remains negative Abdomen is soft  Anemia H&H have stabilized.  Left hip fracture, proximal femur fracture POD #6 status post pinning PT OT are working with patient. Pain management and DVT prophylaxis per orthopedics Awaiting discharge to a Covid positive rehab bed, although as noted above it would be very nice for patient if she could go home with full-time caregivers.  She really is very lonely.  COVID-19 infection Chest x-ray is without acute abnormalities, patient is not hypoxic. We will not start remdesivir as patient does not really have severe disease as per CDC/WHO guidelines. No indication for steroids given no hypoxia.  CAD (coronary artery disease) Does not appear to be on any CAD medications at home. Does not appear to be on any antiplatelets at home.  HTN She is relatively normotensive, does not appear to be on any BP meds at home.  Dementia Reorder Haldol per home regimen however will make it every 8 as needed rather than twice daily standing.   DVT prophylaxis: SCD Code Status: DNR Family Communication: Called and spoke with son Arriona Andersson, noted that she was lonely but seemed brighter than she had been before.  She used to have full-time caregivers however they have found other employment.  He and his sister-in-law are potentially looking for other full-time caregivers for her so that he might take her home if possible. Disposition  Plan:   Patient is from: Home  Anticipated Discharge Location: Rehab  Barriers to Discharge: Looking for Covid rehab bed, son and daughter-in-law also looking for full-time caregivers to take her home.  Is patient medically stable for Discharge: Yes   Consultants:  Orthopedics  Cardiology for preoperative evaluation.  Procedures:  Left intertrochanteric/intramedullary nail  Antimicrobials:  None   Exam:  General: Patient is sitting up in chair, is much brighter than I have seen her look.   She has a twinkle in her eyes, is much more communicative.   Eyes: sclera anicteric, conjuctiva mild injection bilaterally CVS: S1-S2, regular  Respiratory:  decreased air entry bilaterally secondary to decreased inspiratory effort, rales at bases  GI: Emaciated, scaphoid, positive bowel sounds.  Nontender. LE: No edema.   Data Reviewed: Basic Metabolic Panel: Recent Labs  Lab 07/27/19 0503 07/28/19 0507 07/29/19 0607 07/30/19 0600 08/01/19 0604  NA 143 141 141 142 138  K 3.8 3.6 3.3* 3.8 3.4*  CL 108 109 108 108 106  CO2 24 29 28 28 27   GLUCOSE 116* 92 97 81 86  BUN 25* 27* 23 26* 19  CREATININE 0.96 0.93 0.78 0.75 0.56  CALCIUM 8.6* 8.3* 8.2* 8.3* 7.7*   Liver Function Tests: Recent Labs  Lab 07/25/19 1818 07/27/19 0503  AST 19 16  ALT 11 11  ALKPHOS 69 57  BILITOT 1.3* 1.2  PROT 6.3* 5.2*  ALBUMIN 2.8* 2.4*   No results for input(s): LIPASE, AMYLASE in the last 168 hours. No results for input(s): AMMONIA in the last 168 hours. CBC: Recent Labs  Lab 07/25/19 1818 07/26/19 0505 07/27/19 0503 07/28/19 0507 07/29/19 [id number] 07/31/19 0628 08/01/19 0604  WBC 7.2   < > 17.6* 14.8* 13.7* 10.1 10.1  NEUTROABS 5.2  --   --   --   --   --   --   HGB 13.4   < > 10.8* 9.0* 8.6* 7.8* 8.5*  HCT 40.1   < > 33.0* 26.4* 25.8* 23.5* 25.7*  MCV 94.1   < > 95.4 92.3 94.9 94.0 94.1  PLT 195   < > 145* 144* 159 161 187   < > = values in this interval not displayed.   Cardiac Enzymes: No results for input(s): CKTOTAL, CKMB, CKMBINDEX, TROPONINI in the last 168 hours. BNP (last 3 results) No results for input(s): PROBNP in the last 8760 hours. CBG: No results for input(s): GLUCAP in the last 168 hours.  Recent Results (from the past 240 hour(s))  Respiratory Panel by RT PCR (Flu A&B, Covid) - Nasopharyngeal Swab     Status: Abnormal   Collection Time: 07/25/19  7:28 PM   Specimen: Nasopharyngeal Swab  Result Value Ref Range Status   SARS Coronavirus 2 by RT PCR POSITIVE  (A) NEGATIVE Final    Comment: RESULT CALLED TO, READ BACK BY AND VERIFIED WITH: KATIE FERGUSON @ 2120 ON 07/25/2019 BY [facility] (NOTE) SARS-CoV-2 target nucleic acids are DETECTED. SARS-CoV-2 RNA is generally detectable in upper respiratory specimens  during the acute phase of infection. Positive results are indicative of the presence of the identified virus, but do not rule out bacterial infection or co-infection with other pathogens not detected by the test. Clinical correlation with patient history and other diagnostic information is necessary to determine patient infection status. The expected result is Negative. Fact Sheet for Patients:  PinkCheek.be Fact Sheet for Healthcare Providers: GravelBags.it This test is not yet approved or cleared by the [place] and  has been authorized for detection and/or diagnosis of SARS-CoV-2 by FDA under an Emergency Use Authorization (EUA).  This EUA will remain in effect (meaning this test can be u sed) for the duration of  the COVID-19 declaration under Section 564(b)(1) of the Act, 21 U.S.C. section 360bbb-3(b)(1), unless the authorization is terminated or revoked sooner.    Influenza A by PCR NEGATIVE NEGATIVE Final   Influenza B by PCR NEGATIVE NEGATIVE Final    Comment: (NOTE) The Xpert Xpress SARS-CoV-2/FLU/RSV assay is intended as an aid in  the diagnosis of influenza from Nasopharyngeal swab specimens and  should not be used as a sole basis for treatment. Nasal washings and  aspirates are unacceptable for Xpert Xpress SARS-CoV-2/FLU/RSV  testing. Fact Sheet for Patients: PinkCheek.be Fact Sheet for Healthcare Providers: GravelBags.it This test is not yet approved or cleared by the [place] FDA and  has been authorized for detection and/or diagnosis of SARS-CoV-2 by  FDA under an Emergency Use Authorization  (EUA). This EUA will remain  in effect (meaning this test can be used) for the duration of the  Covid-19 declaration under Section 564(b)(1) of the Act, 21  U.S.C. section 360bbb-3(b)(1), unless the authorization is  terminated or revoked. Performed at [facility], [address], [place], [place] [address]       Studies: No results found.   Scheduled Meds: . aspirin EC  81 mg Oral Daily  . Chlorhexidine Gluconate Cloth  6 each Topical Daily  . docusate sodium  100 mg Oral BID  . enalapril  2.5 mg Oral Daily  . enoxaparin (LOVENOX) injection  40 mg Subcutaneous Q24H  . feeding supplement (ENSURE ENLIVE)  237 mL Oral BID BM  . metoprolol succinate  25 mg Oral Daily  . multivitamin with minerals  1 tablet Oral Daily  . pneumococcal 23 valent vaccine  0.5 mL Intramuscular Tomorrow-1000   Continuous Infusions: . sodium chloride 100 mL/hr at 07/31/19 2352  . methocarbamol (ROBAXIN) IV      Principal Problem:   Fracture, proximal femur, left, closed, initial encounter ([place]) Active Problems:   CAD (coronary artery disease)   Essential hypertension   Dementia without behavioral disturbance ([place])   Accidental fall   Preoperative clearance   Closed left hip fracture, initial encounter ([place])     Marrianne Sica Derek Jack, [facility]  If 7PM-7AM, please contact night-coverage [email or web address] Password [facility] 08/01/2019, 3:33 PM    LOS: 7 days

## 2019-08-01 NOTE — Progress Notes (Signed)
SLP F/U Note  Patient Details Name: [name] [name] MRN: [id number] DOB: [birth year]   Cancelled treatment:       Reason Eval/Treat Not Completed: (chart reviewed; consulted NSG re: pt's status). NSG reported pt is tolerating her current diet well, ate "most" of her breakfast this morning w/ NA. No overt s/s of aspiration noted w/ oral intake of puree, Nectar liquids.  Recommend continue w/ current diet at this time for ease and safety of intake; trials of thin liquids tomorrow. NSG agreed.      [name], MS, CCC-SLP Zaron Zwiefelhofer 08/01/2019, 1:14 PM

## 2019-08-01 NOTE — Progress Notes (Signed)
Physical Therapy Treatment Patient Details Name: [name] [name] MRN: [id number] DOB: [birth year] Today's Date: 08/01/2019    History of Present Illness 86 year old independent living female with full-time caregiver who has PMH significant for HTN, CAD and dementia with sundowning is admitted 07/25/2019 with left hip fracture status post mechanical fall, s/p IM nailing (07/26/19), WBAT.  Preop evaluation revealed positive Covid PCR.  Patient had no oxygen requirement and chest x-ray was without acute process.    PT Comments    Pt was long sitting in bed with RN in room upon therapist arriving. She agrees to PT session and is cooperative and pleasant throughout. Pt has baseline cognition deficits however was able to follow commands throughout session with increased time to process. She was able to perform and tolerate ther ex that are listed below. Pt demonstrated improved safe functional mobility this date and was able to exit R side of bed with mod assist + increased time. She was cued throughout for proper sequencing and overall safety. Once seated EOB, pt performed standing 3 x prior to ambulating ~ 10 ft in room. She fatigued quickly however vitals remained stable throughout. RW used + gait belt for safety. Pt ambulated with slow step to gait pattern without LOB or unsteadiness noted. She was repositioned in recliner post session with call bell in reach, chair alarm in place, and RN aware of pt's abilities. PT continues to recommend DC to SNF to address deficits with strength, balance, and overall safe functional mobility.      Follow Up Recommendations  SNF     Equipment Recommendations  Rolling walker with 5" wheels;3in1 (PT)    Recommendations for Other Services       Precautions / Restrictions Precautions Precautions: Fall Restrictions Weight Bearing Restrictions: Yes LLE Weight Bearing: Weight bearing as tolerated    Mobility  Bed Mobility Overal bed mobility: Needs  Assistance Bed Mobility: Supine to Sit     Supine to sit: Mod assist;HOB elevated     General bed mobility comments: Pt required less assistance this date to exit R side of bed. Increased time to perform with vcs for sequencing + mod assist to acheive EOB short sitting  Transfers Overall transfer level: Needs assistance Equipment used: Rolling walker (2 wheeled);None Transfers: Sit to/from Stand Sit to Stand: Min assist;Mod assist;From elevated surface         General transfer comment: pt was able to stand from EOB 3 x with mod assist at first progressing to min assist by the end of session. Vcs used RW and was cued for proper hand placement and technique. she demonstartes good eccentric control with stand to sit.  Ambulation/Gait Ambulation/Gait assistance: Min assist Gait Distance (Feet): 10 Feet Assistive device: Rolling walker (2 wheeled) Gait Pattern/deviations: Step-to pattern Gait velocity: decreased   General Gait Details: Pt ambulated with very slow cadence however no LOB or unsteadiness. She does fatigue quickly but was able to ambulate ~ 10 ft prior to requesting seated rest. Vitals stable throughout session.    Stairs             Wheelchair Mobility    Modified Rankin (Stroke Patients Only)       Balance Overall balance assessment: Needs assistance Sitting-balance support: No upper extremity supported;Feet supported Sitting balance-Leahy Scale: Fair     Standing balance support: Bilateral upper extremity supported Standing balance-Leahy Scale: Fair Standing balance comment: No LOB this date with BUE support.  Cognition Arousal/Alertness: Awake/alert Behavior During Therapy: WFL for tasks assessed/performed Overall Cognitive Status: History of cognitive impairments - at baseline                                 General Comments: Pt is alert throughout session but pleasantly confused. She was  able to follow commands consistently throughout session with increased time to process      Exercises Total Joint Exercises Ankle Circles/Pumps: Both;20 reps Quad Sets: AROM;10 reps;Supine Gluteal Sets: AROM;10 reps Heel Slides: AROM;Left;10 reps Hip ABduction/ADduction: AAROM;Left;10 reps Straight Leg Raises: AAROM;Left;10 reps    General Comments        Pertinent Vitals/Pain Pain Assessment: Faces Faces Pain Scale: Hurts little more Pain Location: L hip Pain Descriptors / Indicators: Grimacing;Guarding Pain Intervention(s): Limited activity within patient's tolerance;Monitored during session;Premedicated before session;Repositioned    Home Living                      Prior Function            PT Goals (current goals can now be found in the care plan section) Acute Rehab PT Goals Patient Stated Goal: To go home Progress towards PT goals: Progressing toward goals    Frequency    7X/week      PT Plan Current plan remains appropriate    Co-evaluation     PT goals addressed during session: Mobility/safety with mobility;Strengthening/ROM        AM-PAC PT "6 Clicks" Mobility   Outcome Measure  Help needed turning from your back to your side while in a flat bed without using bedrails?: A Lot Help needed moving from lying on your back to sitting on the side of a flat bed without using bedrails?: A Lot Help needed moving to and from a bed to a chair (including a wheelchair)?: A Lot Help needed standing up from a chair using your arms (e.g., wheelchair or bedside chair)?: A Lot Help needed to walk in hospital room?: A Lot Help needed climbing 3-5 steps with a railing? : A Lot 6 Click Score: 12    End of Session Equipment Utilized During Treatment: Gait belt Activity Tolerance: Patient tolerated treatment well Patient left: in chair;with chair alarm set;with call bell/phone within reach;with nursing/sitter in room Nurse Communication: Mobility  status PT Visit Diagnosis: Muscle weakness (generalized) (M62.81);Difficulty in walking, not elsewhere classified (R26.2);Pain Pain - Right/Left: Left Pain - part of body: Hip     Time: 0935-1000 PT Time Calculation (min) (ACUTE ONLY): 25 min  Charges:  $Therapeutic Exercise: 8-22 mins $Therapeutic Activity: 8-22 mins                     Julaine Fusi PTA 08/01/19, 10:14 AM

## 2019-08-01 NOTE — TOC Progression Note (Signed)
Transition of Care [facility]) - Progression Note    Patient Details  Name: [name] [name] MRN: [id number] Date of Birth: 1921/10/13  [facility]) CM/SW Contact  Tangelia Sanson, Gardiner Rhyme, LCSW Phone Number: 08/01/2019, 4:10 PM  Clinical Narrative:  Family is still looking for caregivers for pt so could go home from hospital instead of a SNF. Daughter in-law can find some that can begin Monday due to pt's COVID. Made aware only MD can decide this. But will go ahead and work on equipment for home along with insurance auth for SNF. [facility] is involved and will work with them on this.     Expected Discharge Plan: [place]    Expected Discharge Plan and Services Expected Discharge Plan: [place] arrangements for the past 2 months: Single Family Home                                       Social Determinants of Health (SDOH) Interventions    Readmission Risk Interventions No flowsheet data found.

## 2019-08-01 NOTE — TOC Progression Note (Addendum)
Transition of Care [facility]) - Progression Note    Patient Details  Name: GENYSIS BALLAS MRN: [id number] Date of Birth: 05-Dec-1921  Transition of Care [facility]) CM/SW Contact  Ceana Fiala, Gardiner Rhyme, LCSW Phone Number: 08/01/2019, 12:18 PM  Clinical Narrative:   Spoke with son who wants to pursue [facility], have sent information and also contacted [facility]. Will await call back regarding bed offer  2:21 Spoke with [facility] they can not take COVID positve pt's. Still working on [facility]    Expected Discharge Plan: [place]    Expected Discharge Plan and Services Expected Discharge Plan: [place] arrangements for the past 2 months: Single Family Home                                       Social Determinants of Health (SDOH) Interventions    Readmission Risk Interventions No flowsheet data found.

## 2019-08-01 NOTE — TOC Progression Note (Signed)
Transition of Care [facility]) - Progression Note    Patient Details  Name: [name] [name] MRN: [id number] Date of Birth: [birth year]  Transition [facility]) CM/SW Contact  Kinlie Janice, Gardiner Rhyme, LCSW Phone Number: 08/01/2019, 11:23 AM  Clinical Narrative:  [facility] in [place] can take pt 4/9 due to 10 days since COVID diagnosis. They are currently working on insurance auth with [employer] may take 2-3 days. Family is aware of this plan and agreeable. Pt will need to have a bowel movement before she is discharged to SNF. Continue to work on discharge. Daughter in-law aware she will still need 24 hr care once discharges from SNF and is working on caregivers.    Expected Discharge Plan: [place]    Expected Discharge Plan and Services Expected Discharge Plan: [place] arrangements for the past 2 months: Single Family Home                                       Social Determinants of Health (SDOH) Interventions    Readmission Risk Interventions No flowsheet data found.

## 2019-08-01 NOTE — Progress Notes (Signed)
Patient has not had a documented bowel movement during admission (Admitted 4/1). Bisacodyl supp was ordered by MD 4/5 but it was never given. Per MD note on 4/5 patient was to be given Dulcolax suppository and if not effective, try Fleet enema. Patient has orders for PRN  bisacodyl and Fleet enema. Messaged RN 4/7 @ 1015 about patient needing BM and recommending administration of one of her PRN options.   Pernell Dupre, PharmD, BCPS Clinical Pharmacist 08/01/2019 10:17 AM

## 2019-08-01 NOTE — Progress Notes (Signed)
Occupational Therapy Treatment Patient Details Name: [name] [name] MRN: [id number] DOB: 1922-01-15 Today's Date: 08/01/2019    History of present illness 86 year old independent living female with full-time caregiver who has PMH significant for HTN, CAD and dementia with sundowning is admitted 07/25/2019 with left hip fracture status post mechanical fall, s/p IM nailing (07/26/19), WBAT.  Preop evaluation revealed positive Covid PCR.  Patient had no oxygen requirement and chest x-ray was without acute process.   OT comments  Patient supine in bed upon arrival, agreeable to therapy.  Patient remains confused but is pleasant and cooperative.  Performed bed mobility to EOB with MAX A this date.  Tolerated sitting at EOB with feet on floor for < 4 minutes with close SBA/CGA.  Patient reports too much pain and lethargy and assisted back to supine.  Participated in hygiene and self feeding tasks at bed level with MIN A.  Required cues from therapist for thoroughness, sequencing and safety.  Based on today's performance, follow up recommendation remains appropriate.     Follow Up Recommendations  SNF    Equipment Recommendations  Other (comment)(defer to next level of care)    Recommendations for Other Services      Precautions / Restrictions Precautions Precautions: Fall;Other (comment) Precaution Comments: COVID-19: airborne/isolation precautions Restrictions Weight Bearing Restrictions: Yes LLE Weight Bearing: Weight bearing as tolerated       Mobility Bed Mobility Overal bed mobility: Needs Assistance Bed Mobility: Supine to Sit;Sit to Supine     Supine to sit: HOB elevated;Max assist Sit to supine: Max assist;HOB elevated   General bed mobility comments: Increased time with education on body mechanics and use of bed railing.  Tolerated sitting for >4 minutes with close SBA/CGA and B UEs supporting.  Transfers                 General transfer comment: Not performed this  date    Balance                                           ADL either performed or assessed with clinical judgement   ADL Overall ADL's : Needs assistance/impaired Eating/Feeding: Minimal assistance;Bed level;Cueing for safety Eating/Feeding Details (indicate cue type and reason): Patient with limited motivation to complete task independently. Grooming: Wash/dry hands;Wash/dry face;Minimal assistance;Bed level Grooming Details (indicate cue type and reason): Requires assistance for thoroughness                                     Vision Patient Visual Report: No change from baseline     Perception     Praxis      Cognition Arousal/Alertness: Awake/alert Behavior During Therapy: WFL for tasks assessed/performed Overall Cognitive Status: History of cognitive impairments - at baseline                                 General Comments: Able to follow one step commands consistently.  Pleasantly confused orientation.        Exercises Other Exercises Other Exercises: Attempted to perform grooming at EOB.  Patient very lethargic and in pain.  Tolerated sitting EOB <4 minutes with CGa/SBA.  MAX A needed for bed mobility. Other Exercises: Patient participated in self-feeding and hygiene at bed  level with MIN A this date for thoroughness. Therapist provided education on sequencing and safety.   Shoulder Instructions       General Comments      Pertinent Vitals/ Pain       Pain Assessment: Faces Faces Pain Scale: Hurts little more Pain Location: L hip with movement Pain Descriptors / Indicators: Grimacing;Guarding;Discomfort Pain Intervention(s): Limited activity within patient's tolerance;Monitored during session;Repositioned  Home Living                                          Prior Functioning/Environment              Frequency  Min 2X/week        Progress Toward Goals  OT Goals(current goals  can now be found in the care plan section)  Progress towards OT goals: Progressing toward goals     Plan Discharge plan remains appropriate;Frequency remains appropriate    Co-evaluation                 AM-PAC OT "6 Clicks" Daily Activity     Outcome Measure   Help from another person eating meals?: A Little Help from another person taking care of personal grooming?: A Little Help from another person toileting, which includes using toliet, bedpan, or urinal?: A Lot Help from another person bathing (including washing, rinsing, drying)?: A Lot Help from another person to put on and taking off regular upper body clothing?: A Little Help from another person to put on and taking off regular lower body clothing?: A Lot 6 Click Score: 15    End of Session    OT Visit Diagnosis: Other abnormalities of gait and mobility (R26.89);Unsteadiness on feet (R26.81)   Activity Tolerance Patient tolerated treatment well   Patient Left in bed;with call bell/phone within reach;with bed alarm set   Nurse Communication          Time: [id number] OT Time Calculation (min): 24 min  Charges: OT General Charges $OT Visit: 1 Visit OT Treatments $Self Care/Home Management : 8-22 mins $Therapeutic Activity: 8-22 mins  Baldomero Lamy, MS, OTR/L 08/01/19, 5:06 PM

## 2019-08-02 LAB — BASIC METABOLIC PANEL
Anion gap: 6 (ref 5–15)
BUN: 20 mg/dL (ref 8–23)
CO2: 27 mmol/L (ref 22–32)
Calcium: 8 mg/dL — ABNORMAL LOW (ref 8.9–10.3)
Chloride: 106 mmol/L (ref 98–111)
Creatinine, Ser: 0.48 mg/dL (ref 0.44–1.00)
GFR calc Af Amer: >60 mL/min (ref >60)
GFR calc non Af Amer: >60 mL/min (ref >60)
Glucose, Bld: 83 mg/dL (ref 70–99)
Potassium: 3.9 mmol/L (ref 3.5–5.1)
Sodium: 139 mmol/L (ref 135–145)

## 2019-08-02 MED ORDER — NEPRO/CARBSTEADY PO LIQD
237.0000 mL | Freq: Three times a day (TID) | ORAL | Status: DC
  Administered 2019-08-02 – 2019-08-06 (×7): 237 mL via ORAL

## 2019-08-02 NOTE — Progress Notes (Signed)
PT Cancellation Note  Patient Details Name: [name] [name] MRN: [id number] DOB: 1921/12/24   Cancelled Treatment:     PT attempt. Pt politely refused stating she would like to sleep. Pt requetsing therapist return in morning. PT will continue to follow per POC    Willette Pa 08/02/2019, 4:28 PM

## 2019-08-02 NOTE — TOC Progression Note (Addendum)
Transition of Care [facility]) - Progression Note    Patient Details  Name: [name] [name] MRN: [id number] Date of Birth: 02/02/1922  Transition of Care [facility]) CM/SW Contact  Israella Hubert, Gardiner Rhyme, LCSW Phone Number: 08/02/2019, 3:44 PM  Clinical Narrative:  Family has caregivers in place for Monday and MD has agreed for discharge then. Spoke with [facility] still do not have insurance auth for admission. Will work on equipment and follow up, [employer] is involved and will be providing follow up. Will work on discharge needs.  4:00 pm Equipment to be delivered to home this weekend in preparation for DC Monday  Expected Discharge Plan: [place]    Expected Discharge Plan and Services Expected Discharge Plan: [place] arrangements for the past 2 months: Single Family Home                                       Social Determinants of Health (SDOH) Interventions    Readmission Risk Interventions No flowsheet data found.

## 2019-08-02 NOTE — Care Management Important Message (Addendum)
Important Message  Patient Details  Name: [name] [name] MRN: [id number] Date of Birth: [birth year]   Medicare Important Message Given:  Yes  Called son via telephone to explain form   Neale Burly 08/02/2019, 10:42 AM

## 2019-08-02 NOTE — Progress Notes (Signed)
SUBJECTIVE: Patient resting comfortably in bed. Denies shortness of breath. Does state she is having some chest tightness.    Vitals:   08/01/19 0931 08/01/19 1532 08/02/19 0000 08/02/19 0851  BP: (!) 135/57 (!) 153/86 (!) 128/48 (!) 156/62  Pulse: 70 76 67 73  Resp: 16 (!) 22 20 19   Temp:  98 F (36.7 C) 97.7 F (36.5 C) 98.4 F (36.9 C)  TempSrc:  Oral Oral Oral  SpO2: 96% 95% 97% 96%  Weight:      Height:        Intake/Output Summary (Last 24 hours) at 08/02/2019 0911 Last data filed at 08/02/2019 0600 Gross per 24 hour  Intake --  Output 750 ml  Net -750 ml    LABS: Basic Metabolic Panel: Recent Labs    08/01/19 0604  NA 138  K 3.4*  CL 106  CO2 27  GLUCOSE 86  BUN 19  CREATININE 0.56  CALCIUM 7.7*   Liver Function Tests: No results for input(s): AST, ALT, ALKPHOS, BILITOT, PROT, ALBUMIN in the last 72 hours. No results for input(s): LIPASE, AMYLASE in the last 72 hours. CBC: Recent Labs    07/31/19 0628 08/01/19 0604  WBC 10.1 10.1  HGB 7.8* 8.5*  HCT 23.5* 25.7*  MCV 94.0 94.1  PLT 161 187   Cardiac Enzymes: No results for input(s): CKTOTAL, CKMB, CKMBINDEX, TROPONINI in the last 72 hours. BNP: Invalid input(s): POCBNP D-Dimer: No results for input(s): DDIMER in the last 72 hours. Hemoglobin A1C: No results for input(s): HGBA1C in the last 72 hours. Fasting Lipid Panel: No results for input(s): CHOL, HDL, LDLCALC, TRIG, CHOLHDL, LDLDIRECT in the last 72 hours. Thyroid Function Tests: No results for input(s): TSH, T4TOTAL, T3FREE, THYROIDAB in the last 72 hours.  Invalid input(s): FREET3 Anemia Panel: No results for input(s): VITAMINB12, FOLATE, FERRITIN, TIBC, IRON, RETICCTPCT in the last 72 hours.   PHYSICAL EXAM General: Well developed, well nourished, in no acute distress HEENT:  Normocephalic and atramatic Neck:  No JVD.  Lungs: Clear bilaterally to auscultation and percussion. Heart: HRRR . Normal S1 and S2 without gallops or  murmurs.  Abdomen: Bowel sounds are positive, abdomen soft and non-tender  Msk:  Back normal, normal gait. Normal strength and tone for age. Extremities: No clubbing, cyanosis or edema.   Neuro: Pleasantly confused. Psych:  Good affect, responds appropriately  TELEMETRY: SB. 57/bpm  ASSESSMENT AND PLAN:  Patient most likely had inferior wall STEMI with Covid infection. Mild chest tightness this morning, will repeat EKG. Patient with normal ejection fraction on echocardiogram with no significant wall motion abnormalities.  Continue medical treatment with beta-blocker and aspirin. Patient seems to be becoming delirious. As long as repeat EKG is unremarkable, patient stable for discharge from a cardiac point of view.      Principal Problem:   Fracture, proximal femur, left, closed, [facility]) Active Problems:   CAD (coronary artery disease)   Essential hypertension   Dementia without behavioral disturbance ([place])   Accidental fall   Preoperative clearance   Closed left hip fracture, initial encounter ([place])    Adaline Sill, NP-C 08/02/2019 9:11 AM

## 2019-08-02 NOTE — Progress Notes (Signed)
Nutrition Follow Up Note   DOCUMENTATION CODES:   Not applicable  INTERVENTION:   Nepro Shake po TID, each supplement provides 425 kcal and 19 grams protein  Magic cup TID with meals, each supplement provides 290 kcal and 9 grams of protein  MVI daily   Bowel regimen as needed per MD  NUTRITION DIAGNOSIS:   Increased nutrient needs related to hip fracture, other (see comment)(COVID 19) as evidenced by increased estimated needs.  GOAL:   Patient will meet greater than or equal to 90% of their needs  -progressing   MONITOR:   PO intake, Supplement acceptance, Labs, Weight trends, Skin, I & O's  ASSESSMENT:   86 y.o. female with a history of breast cancer, chronic kidney disease, GERD, hyperlipidemia, hypertension, hiatal hernia, dementia who presents with L hip fracture s/p fall and subsequently found to have COVID 19  Pt with poor appetite and oral intake in hospital; pt eating 25-50% of meals but is drinking some Ensure. Pt seen by SLP and placed on a dysphagia 3/nectar thick. RD will switch Ensure to Nepro as this is nectar thick. Recommend continue supplements and MVI after discharge. No new weight since admit; will request weekly weights. No BM noted since admit; recommend bowel regimen as needed per MD.   Medications reviewed and include: aspirin, colace, lovenox, MVI  Labs reviewed: Hgb 8.5(L), Hct 25.7(L)  Diet Order:   Diet Order            DIET DYS 3 Room service appropriate? Yes with Assist; Fluid consistency: Nectar Thick  Diet effective now             EDUCATION NEEDS:   Not appropriate for education at this time  Skin:   not assessed   Last BM:  unknown  Height:   Ht Readings from Last 1 Encounters:  07/25/19 5\' 6"  (1.676 m)    Weight:   Wt Readings from Last 1 Encounters:  07/25/19 56.7 kg    Ideal Body Weight:  59 kg  BMI:  Body mass index is 20.18 kg/m.  Estimated Nutritional Needs:   Kcal:  1400-1600kcal/day  Protein:   70-80g/day  Fluid:  >1.4L/day  Knox Saliva MS, RD, LDN Please refer to AMION for RD and/or RD on-call/weekend/after hours pager

## 2019-08-02 NOTE — Progress Notes (Signed)
PROGRESS NOTE    [name] [name]  [id number]  DOB: [birth year]  DOA: 07/25/2019 PCP: Jodi Marble, MD Outpatient Specialists:   Hospital course:  86 year old independent living female with full-time caregiver who has PMH significant for HTN, CAD and dementia with sundowning is admitted 07/25/2019 with left hip fracture status post mechanical fall.  Preop evaluation revealed positive Covid PCR.  Patient had no oxygen requirement and chest x-ray was without acute process.  Subjective:  I told patient she would be going home on Monday and she said to me "are you for real?".  Then she said "is that legitimate?".  She seemed relieved to hear that she was going to go home.  She said "why am I so scared".  She states she does want to go home.  She said she will count the 3 days before she can go home.  Objective: Vitals:   08/01/19 1532 08/02/19 0000 08/02/19 0851 08/02/19 1740  BP: (!) 153/86 (!) 128/48 (!) 156/62 (!) 144/52  Pulse: 76 67 73 87  Resp: (!) 22 20 19 17   Temp: 98 F (36.7 C) 97.7 F (36.5 C) 98.4 F (36.9 C) 98.2 F (36.8 C)  TempSrc: Oral Oral Oral Oral  SpO2: 95% 97% 96% 95%  Weight:      Height:        Intake/Output Summary (Last 24 hours) at 08/02/2019 1802 Last data filed at 08/02/2019 0600 Gross per 24 hour  Intake --  Output 750 ml  Net -750 ml   Filed Weights   07/25/19 1827  Weight: 56.7 kg     Assessment & Plan:   86 year old independent living female with full-time caregiver and history of HTN, CAD and dementia with sundowning presents with left hip fracture after mechanical fall.  She is found to be Covid positive with negative chest x-ray no hypoxia.   Left hip fracture, proximal femur fracture POD #7 status post pinning PT OT are working with patient. Pain management and DVT prophylaxis per orthopedics Patient's family has arranged for full-time caregivers to take care of her at home, the are going to be tested for Covid and they  will be able to accept her at home on Monday.  Patient is very pleased to be able to go home.  Lethargy Patient continues to look well today, awake alert and focused.  Constipation Patient declined Dulcolax suppository yesterday, will continue to work with nurses to treat constipation if warranted.  Leukocytosis Resolving possibly secondary to constipation Patient remains afebrile UA is negative Patient has no oxygen requirement and chest x-ray remains negative Abdomen is soft  Anemia H&H have stabilized.  COVID-19 infection Chest x-ray is without acute abnormalities, patient is not hypoxic. We will not start remdesivir as patient does not really have severe disease as per CDC/WHO guidelines. No indication for steroids given no hypoxia.  CAD (coronary artery disease) Does not appear to be on any CAD medications at home. Does not appear to be on any antiplatelets at home.  HTN She is relatively normotensive, does not appear to be on any BP meds at home.  Dementia Reorder Haldol per home regimen however will make it every 8 as needed rather than twice daily standing.   DVT prophylaxis: SCD Code Status: DNR Family Communication: I have been in communication with patient's son [name] on a regular basis.  Disposition Plan:   Patient is from: Home  Anticipated Discharge Location: Home with full-time care  Barriers to Discharge: Caregivers are getting  Covid tested, results will be available on Monday and patient can be discharged to home on Monday.   Is patient medically stable for Discharge: Yes   Consultants:  Orthopedics  Cardiology for preoperative evaluation.  Procedures:  Left intertrochanteric/intramedullary nail  Antimicrobials:  None   Exam:  General: Patient is sitting up in bed, awake and alert.  She brightens when she sees me.   Eyes: sclera anicteric, conjuctiva mild injection bilaterally CVS: S1-S2, regular  Respiratory:  decreased air entry  bilaterally secondary to decreased inspiratory effort, rales at bases  GI: Emaciated, scaphoid, positive bowel sounds.  Nontender. LE: No edema.   Data Reviewed: Basic Metabolic Panel: Recent Labs  Lab 07/28/19 0507 07/29/19 0607 07/30/19 0600 08/01/19 0604 08/02/19 0938  NA 141 141 142 138 139  K 3.6 3.3* 3.8 3.4* 3.9  CL 109 108 108 106 106  CO2 29 28 28 27 27   GLUCOSE 92 97 81 86 83  BUN 27* 23 26* 19 20  CREATININE 0.93 0.78 0.75 0.56 0.48  CALCIUM 8.3* 8.2* 8.3* 7.7* 8.0*   Liver Function Tests: Recent Labs  Lab 07/27/19 0503  AST 16  ALT 11  ALKPHOS 57  BILITOT 1.2  PROT 5.2*  ALBUMIN 2.4*   No results for input(s): LIPASE, AMYLASE in the last 168 hours. No results for input(s): AMMONIA in the last 168 hours. CBC: Recent Labs  Lab 07/27/19 0503 07/28/19 0507 07/29/19 0607 07/31/19 0628 08/01/19 0604  WBC 17.6* 14.8* 13.7* 10.1 10.1  HGB 10.8* 9.0* 8.6* 7.8* 8.5*  HCT 33.0* 26.4* 25.8* 23.5* 25.7*  MCV 95.4 92.3 94.9 94.0 94.1  PLT 145* 144* 159 161 187   Cardiac Enzymes: No results for input(s): CKTOTAL, CKMB, CKMBINDEX, TROPONINI in the last 168 hours. BNP (last 3 results) No results for input(s): PROBNP in the last 8760 hours. CBG: No results for input(s): GLUCAP in the last 168 hours.  Recent Results (from the past 240 hour(s))  Respiratory Panel by RT PCR (Flu A&B, Covid) - Nasopharyngeal Swab     Status: Abnormal   Collection Time: 07/25/19  7:28 PM   Specimen: Nasopharyngeal Swab  Result Value Ref Range Status   SARS Coronavirus 2 by RT PCR POSITIVE (A) NEGATIVE Final    Comment: RESULT CALLED TO, READ BACK BY AND VERIFIED WITH: KATIE FERGUSON @ 2120 ON 07/25/2019 BY CAF (NOTE) SARS-CoV-2 target nucleic acids are DETECTED. SARS-CoV-2 RNA is generally detectable in upper respiratory specimens  during the acute phase of infection. Positive results are indicative of the presence of the identified virus, but do not rule out bacterial  infection or co-infection with other pathogens not detected by the test. Clinical correlation with patient history and other diagnostic information is necessary to determine patient infection status. The expected result is Negative. Fact Sheet for Patients:  PinkCheek.be Fact Sheet for Healthcare Providers: GravelBags.it This test is not yet approved or cleared by the [place] FDA and  has been authorized for detection and/or diagnosis of SARS-CoV-2 by FDA under an Emergency Use Authorization (EUA).  This EUA will remain in effect (meaning this test can be u sed) for the duration of  the COVID-19 declaration under Section 564(b)(1) of the Act, 21 U.S.C. section 360bbb-3(b)(1), unless the authorization is terminated or revoked sooner.    Influenza A by PCR NEGATIVE NEGATIVE Final   Influenza B by PCR NEGATIVE NEGATIVE Final    Comment: (NOTE) The Xpert Xpress SARS-CoV-2/FLU/RSV assay is intended as an aid in  the  diagnosis of influenza from Nasopharyngeal swab specimens and  should not be used as a sole basis for treatment. Nasal washings and  aspirates are unacceptable for Xpert Xpress SARS-CoV-2/FLU/RSV  testing. Fact Sheet for Patients: PinkCheek.be Fact Sheet for Healthcare Providers: GravelBags.it This test is not yet approved or cleared by the [place] FDA and  has been authorized for detection and/or diagnosis of SARS-CoV-2 by  FDA under an Emergency Use Authorization (EUA). This EUA will remain  in effect (meaning this test can be used) for the duration of the  Covid-19 declaration under Section 564(b)(1) of the Act, 21  U.S.C. section 360bbb-3(b)(1), unless the authorization is  terminated or revoked. Performed at [facility], [address] [place], [place] [address]       Studies: No results found.   Scheduled Meds: . aspirin  EC  81 mg Oral Daily  . Chlorhexidine Gluconate Cloth  6 each Topical Daily  . docusate sodium  100 mg Oral BID  . enalapril  2.5 mg Oral Daily  . enoxaparin (LOVENOX) injection  40 mg Subcutaneous Q24H  . feeding supplement (NEPRO CARB STEADY)  237 mL Oral TID BM  . metoprolol succinate  25 mg Oral Daily  . multivitamin with minerals  1 tablet Oral Daily  . pneumococcal 23 valent vaccine  0.5 mL Intramuscular Tomorrow-1000   Continuous Infusions: . sodium chloride 100 mL/hr at 07/31/19 2352  . methocarbamol (ROBAXIN) IV      Principal Problem:   Fracture, proximal femur, left, closed, initial encounter ([place]) Active Problems:   CAD (coronary artery disease)   Essential hypertension   Dementia without behavioral disturbance ([place])   Accidental fall   Preoperative clearance   Closed left hip fracture, initial encounter ([place])     Kimiyah Blick Derek Jack, [facility]  If 7PM-7AM, please contact night-coverage [email or web address] Password [email or web address] 08/02/2019, 6:02 PM    LOS: 8 days

## 2019-08-03 NOTE — Progress Notes (Addendum)
Physical Therapy Treatment Patient Details Name: [name] [name] MRN: [id number] DOB: 1921/05/08 Today's Date: 08/03/2019    History of Present Illness 86 year old independent living female with full-time caregiver who has PMH significant for HTN, CAD and dementia with sundowning is admitted 07/25/2019 with left hip fracture status post mechanical fall, s/p IM nailing (07/26/19), WBAT.  Preop evaluation revealed positive Covid PCR.  Patient had no oxygen requirement and chest x-ray was without acute process.    PT Comments    Pt agreed to session with encouragement.  She initially declined "I want to lay right here" but as session progressed and she had BM on bed while standing, she agrees to chair.  Participated in exercises as described below.  To EOB where she does put good effort into getting to edge today.  Generally steady in sitting but needs supervision for safety.  Stands at bedside with encouragement, RW and mod a x 1.  She is able to sidestep along bed to right with min/mod a x 1.  During steps she incontinent of watery loose BM.  Cleaned but she continues to go.  Sat down due to her fatigue on clean pad.  Encouraged her to go to commode but she kept shaking her head no.  On second stand, care was continued and she stopped going.  She agreed to transfer to recliner as bed was soiled.  Transferred with mod a x 1 and did not turn fully before sitting in recliner.  Remained up after session.  RN updated.  Pt with minimal verbal interactions today and seems more quiet and withdrawn compared to previous encounters with patient.   Patient suffers from L hip fracture which impairs his/her ability to perform daily activities like toileting, feeding, dressing, grooming, bathing in the home. A cane, walker, crutch will not resolve the patient's issue with performing activities of daily living. A lightweight wheelchair is required/recommended and will allow patient to safely perform daily activities.    Patient can safely propel the wheelchair in the home or has a caregiver who can provide assistance.   SNF would still be beneficial but pt will be returning home with family per SWS notes.  Equipment recommendations updated.     Follow Up Recommendations  SNF;Supervision for mobility/OOB     Equipment Recommendations  Rolling walker with 5" wheels;3in1 (PT);Hospital bed;Wheelchair (measurements PT);Wheelchair cushion (measurements PT)    Recommendations for Other Services       Precautions / Restrictions Precautions Precautions: Fall;Other (comment) Precaution Comments: COVID-19: airborne/isolation precautions Restrictions Weight Bearing Restrictions: Yes LLE Weight Bearing: Weight bearing as tolerated    Mobility  Bed Mobility Overal bed mobility: Needs Assistance Bed Mobility: Supine to Sit     Supine to sit: Min assist     General bed mobility comments: good effort getting to EOB today  Transfers Overall transfer level: Needs assistance Equipment used: Rolling walker (2 wheeled) Transfers: Sit to/from Stand   Stand pivot transfers: Mod assist          Ambulation/Gait Ambulation/Gait assistance: Mod assist Gait Distance (Feet): 3 Feet Assistive device: Rolling walker (2 wheeled) Gait Pattern/deviations: Step-to pattern;Trunk flexed Gait velocity: decreased   General Gait Details: able to sidestep 3 steps along bed, then transfer to recliner but does not turn fully before sitting.   Stairs             Wheelchair Mobility    Modified Rankin (Stroke Patients Only)       Balance Overall balance assessment: Needs  assistance Sitting-balance support: No upper extremity supported;Feet supported Sitting balance-Leahy Scale: Fair     Standing balance support: Bilateral upper extremity supported Standing balance-Leahy Scale: Poor Standing balance comment: external support needed to remain standing today.                             Cognition Arousal/Alertness: Awake/alert Behavior During Therapy: Flat affect Overall Cognitive Status: History of cognitive impairments - at baseline                                 General Comments: very quiet today, withdrawn and needed encouragement to participate.      Exercises Total Joint Exercises Ankle Circles/Pumps: Both;20 reps Quad Sets: AROM;10 reps;Supine Gluteal Sets: AROM;10 reps Heel Slides: AROM;Left;10 reps Hip ABduction/ADduction: AAROM;Left;10 reps Straight Leg Raises: AAROM;Left;10 reps    General Comments        Pertinent Vitals/Pain Pain Assessment: No/denies pain    Home Living                      Prior Function            PT Goals (current goals can now be found in the care plan section) Progress towards PT goals: Progressing toward goals    Frequency    7X/week      PT Plan Current plan remains appropriate;Other (comment)    Co-evaluation              AM-PAC PT "6 Clicks" Mobility   Outcome Measure  Help needed turning from your back to your side while in a flat bed without using bedrails?: A Lot   Help needed moving to and from a bed to a chair (including a wheelchair)?: A Lot Help needed standing up from a chair using your arms (e.g., wheelchair or bedside chair)?: A Lot Help needed to walk in hospital room?: A Lot Help needed climbing 3-5 steps with a railing? : Total 6 Click Score: 9    End of Session Equipment Utilized During Treatment: Gait belt Activity Tolerance: Patient tolerated treatment well Patient left: in chair;with chair alarm set;with call bell/phone within reach Nurse Communication: Mobility status Pain - Right/Left: Left Pain - part of body: Hip     Time: [id number] PT Time Calculation (min) (ACUTE ONLY): 31 min  Charges:  $Therapeutic Exercise: 8-22 mins $Therapeutic Activity: 8-22 mins                    Chesley Noon, PTA 08/03/19, 1:46 PM

## 2019-08-03 NOTE — Progress Notes (Addendum)
PROGRESS NOTE    [name] [name]  [id number]  DOB: 12/28/1921  DOA: 07/25/2019 PCP: Jodi Marble, MD Outpatient Specialists:   Hospital course:  86 year old independent living female with full-time caregiver who has PMH significant for HTN, CAD and dementia with sundowning is admitted 07/25/2019 with left hip fracture status post mechanical fall.  Preop evaluation revealed positive Covid PCR.  Patient had no oxygen requirement and chest x-ray was without acute process.  Subjective:  Patient does not seem to recognize me today.  When I told her she was going home on Monday, she said she did not know but did not seem particularly happy as she did yesterday.  She states she feels unwell this morning but is unable to state how she feels unwell.  She says she is thirsty.  She says she is hungry but does not like the food here.  Objective: Vitals:   08/02/19 0000 08/02/19 0851 08/02/19 1740 08/03/19 0938  BP: (!) 128/48 (!) 156/62 (!) 144/52 (!) 131/55  Pulse: 67 73 87 85  Resp: 20 19 17 19   Temp: 97.7 F (36.5 C) 98.4 F (36.9 C) 98.2 F (36.8 C) 98.1 F (36.7 C)  TempSrc: Oral Oral Oral Oral  SpO2: 97% 96% 95% 97%  Weight:      Height:       No intake or output data in the 24 hours ending 08/03/19 1152 Filed Weights   07/25/19 1827  Weight: 56.7 kg     Assessment & Plan:   86 year old independent living female with full-time caregiver and history of HTN, CAD and dementia with sundowning presents with left hip fracture after mechanical fall.  She is found to be Covid positive with negative chest x-ray no hypoxia.   Left hip fracture, proximal femur fracture POD #8 status post pinning PT OT are working with patient. Pain management and DVT prophylaxis per orthopedics Patient's family has arranged for full-time caregivers to take care of her at home, the are going to be tested for Covid and they will be able to accept her at home on Monday 08/06/2019. Lethargy  Patient's energy level waxes and wanes day by day She had a great day yesterday, is somewhat less energetic today.  Constipation Nursing to continue work with the patient and constipation, multiple meds and enemas are available as warranted  Leukocytosis Resolving possibly secondary to constipation Patient remains afebrile UA is negative Patient has no oxygen requirement and chest x-ray remains negative Abdomen is soft  Anemia H&H have stabilized.  COVID-19 infection Chest x-ray is without acute abnormalities, patient is not hypoxic. We will not start remdesivir as patient does not really have severe disease as per CDC/WHO guidelines. No indication for steroids given no hypoxia.  CAD (coronary artery disease) Does not appear to be on any CAD medications at home. Does not appear to be on any antiplatelets at home.  HTN She is relatively normotensive, does not appear to be on any BP meds at home.  Dementia Reorder Haldol per home regimen however will make it every 8 as needed rather than twice daily standing.   DVT prophylaxis: Lovenox Code Status: DNR Family Communication: I have been in communication with patient's son [name] on a regular basis.  Disposition Plan:   Patient is from: Home  Anticipated Discharge Location: Home with full-time care  Barriers to Discharge: Caregivers are getting Covid tested, results will be available on Monday and patient can be discharged to home on Monday.   Is  patient medically stable for Discharge: Yes   Consultants:  Orthopedics  Cardiology for preoperative evaluation.  Procedures:  Left intertrochanteric/intramedullary nail  Antimicrobials:  None   Exam:  General: Patient is sitting up in bed, awake and alert.  She brightens when she sees me.   Eyes: sclera anicteric, conjuctiva mild injection bilaterally CVS: S1-S2, regular  Respiratory:  decreased air entry bilaterally secondary to decreased inspiratory effort, rales at  bases  GI: Emaciated, scaphoid, positive bowel sounds.  Nontender. LE: No edema.   Data Reviewed: Basic Metabolic Panel: Recent Labs  Lab 07/28/19 0507 07/29/19 0607 07/30/19 0600 08/01/19 0604 08/02/19 0938  NA 141 141 142 138 139  K 3.6 3.3* 3.8 3.4* 3.9  CL 109 108 108 106 106  CO2 29 28 28 27 27   GLUCOSE 92 97 81 86 83  BUN 27* 23 26* 19 20  CREATININE 0.93 0.78 0.75 0.56 0.48  CALCIUM 8.3* 8.2* 8.3* 7.7* 8.0*   Liver Function Tests: No results for input(s): AST, ALT, ALKPHOS, BILITOT, PROT, ALBUMIN in the last 168 hours. No results for input(s): LIPASE, AMYLASE in the last 168 hours. No results for input(s): AMMONIA in the last 168 hours. CBC: Recent Labs  Lab 07/28/19 0507 07/29/19 0607 07/31/19 0628 08/01/19 0604  WBC 14.8* 13.7* 10.1 10.1  HGB 9.0* 8.6* 7.8* 8.5*  HCT 26.4* 25.8* 23.5* 25.7*  MCV 92.3 94.9 94.0 94.1  PLT 144* 159 161 187   Cardiac Enzymes: No results for input(s): CKTOTAL, CKMB, CKMBINDEX, TROPONINI in the last 168 hours. BNP (last 3 results) No results for input(s): PROBNP in the last 8760 hours. CBG: No results for input(s): GLUCAP in the last 168 hours.  Recent Results (from the past 240 hour(s))  Respiratory Panel by RT PCR (Flu A&B, Covid) - Nasopharyngeal Swab     Status: Abnormal   Collection Time: 07/25/19  7:28 PM   Specimen: Nasopharyngeal Swab  Result Value Ref Range Status   SARS Coronavirus 2 by RT PCR POSITIVE (A) NEGATIVE Final    Comment: RESULT CALLED TO, READ BACK BY AND VERIFIED WITH: KATIE FERGUSON @ 2120 ON 07/25/2019 BY CAF (NOTE) SARS-CoV-2 target nucleic acids are DETECTED. SARS-CoV-2 RNA is generally detectable in upper respiratory specimens  during the acute phase of infection. Positive results are indicative of the presence of the identified virus, but do not rule out bacterial infection or co-infection with other pathogens not detected by the test. Clinical correlation with patient history and other  diagnostic information is necessary to determine patient infection status. The expected result is Negative. Fact Sheet for Patients:  PinkCheek.be Fact Sheet for Healthcare Providers: GravelBags.it This test is not yet approved or cleared by the [place] FDA and  has been authorized for detection and/or diagnosis of SARS-CoV-2 by FDA under an Emergency Use Authorization (EUA).  This EUA will remain in effect (meaning this test can be u sed) for the duration of  the COVID-19 declaration under Section 564(b)(1) of the Act, 21 U.S.C. section 360bbb-3(b)(1), unless the authorization is terminated or revoked sooner.    Influenza A by PCR NEGATIVE NEGATIVE Final   Influenza B by PCR NEGATIVE NEGATIVE Final    Comment: (NOTE) The Xpert Xpress SARS-CoV-2/FLU/RSV assay is intended as an aid in  the diagnosis of influenza from Nasopharyngeal swab specimens and  should not be used as a sole basis for treatment. Nasal washings and  aspirates are unacceptable for Xpert Xpress SARS-CoV-2/FLU/RSV  testing. Fact Sheet for Patients: PinkCheek.be Fact  Sheet for Healthcare Providers: GravelBags.it This test is not yet approved or cleared by the [place] and  has been authorized for detection and/or diagnosis of SARS-CoV-2 by  FDA under an Emergency Use Authorization (EUA). This EUA will remain  in effect (meaning this test can be used) for the duration of the  Covid-19 declaration under Section 564(b)(1) of the Act, 21  U.S.C. section 360bbb-3(b)(1), unless the authorization is  terminated or revoked. Performed at [facility], [address], [place], [place] [address]       Studies: No results found.   Scheduled Meds: . aspirin EC  81 mg Oral Daily  . Chlorhexidine Gluconate Cloth  6 each Topical Daily  . docusate sodium  100 mg Oral BID  . enalapril   2.5 mg Oral Daily  . enoxaparin (LOVENOX) injection  40 mg Subcutaneous Q24H  . feeding supplement (NEPRO CARB STEADY)  237 mL Oral TID BM  . metoprolol succinate  25 mg Oral Daily  . multivitamin with minerals  1 tablet Oral Daily  . pneumococcal 23 valent vaccine  0.5 mL Intramuscular Tomorrow-1000   Continuous Infusions: . sodium chloride 100 mL/hr at 07/31/19 2352  . methocarbamol (ROBAXIN) IV      Principal Problem:   Fracture, proximal femur, left, closed, initial encounter ([place]) Active Problems:   CAD (coronary artery disease)   Essential hypertension   Dementia without behavioral disturbance ([place])   Accidental fall   Preoperative clearance   Closed left hip fracture, initial encounter ([place])     Murl Golladay Derek Jack, [facility]  If 7PM-7AM, please contact night-coverage [email or web address] Password [email or web address] 08/03/2019, 11:52 AM    LOS: 9 days

## 2019-08-03 NOTE — TOC Progression Note (Addendum)
Transition of Care [facility]) - Progression Note    Patient Details  Name: [name] [name] MRN: [id number] Date of Birth: 1922/01/22  [facility]) CM/SW Contact  Rance Smithson, Gardiner Rhyme, LCSW Phone Number: 08/03/2019, 12:03 PM  Clinical Narrative:  Spoke with Karen-daughter in-law to confirm plan for Monday. The caregivers are is place for Monday and the equipment is being delivered to home over the weekend. Continue to work on needs. Pt is aware and very appreciative and grateful she can go home. MD aware of plans. Will go home EMS    Expected Discharge Plan: [place]    Expected Discharge Plan and Services Expected Discharge Plan: [place] arrangements for the past 2 months: Single Family Home                                       Social Determinants of Health (SDOH) Interventions    Readmission Risk Interventions No flowsheet data found.

## 2019-08-03 NOTE — Progress Notes (Addendum)
Speech Language Pathology Treatment: Dysphagia  Patient Details Name: [name] [name] MRN: [id number] DOB: 1921-08-08 Today's Date: 08/03/2019 Time: [id number] SLP Time Calculation (min) (ACUTE ONLY): 45 min  Assessment / Plan / Recommendation Clinical Impression  Pt seen for ongoing assessment of swallowing; toleration of diet and trials to upgrade diet if appropriate and safe. Pt continues to present w/ declined medical status slowly recovering from left hip fracture status post mechanical fall; preop evaluation revealed positive Covid PCR. Respiratory status has remained stable w/out need for O2 support. Pt has Baseline Cognitive status/Dementia w/ Sundowning. Pt is of Advanced Age also.  Pt appears to present w/ Mild-Mod oropharyngeal phase dysphagia in the setting of Baseline Cognitive decline/Dementia, weakness/confusion, and recent traumatic fall w/ hip fx. Mild+ overt, clinical s/s of aspiration noted po trials of thin liquids during attempt again to upgrade diet consistency(to thin liquids). Pt was awake/alert and calm; verbally engaged w/ SLP then helped to hold Cup to feed self his drink. Verbal cues given intermittently for follow through w/ tasks. Pt consumed trials of thin(via Cup) w/ overt coughing noted 4/6 trials -- mild cough. NO overt coughing or s/s of aspiration noted w/ trials of Nectar liquids via cup/straw, purees and mech soft/solid food trials. No decline in vocal quality or respiratory status during/post trials of the foods/Nectar liquids. Oral phase appeared grossly [facility] for bolus management of liquids and purees and well-broken down soft solids -- min+ increased time needed full mastication of the increased texture trials d/t Missing Dentition for mastication but given Time, she cleared. Pt only accepted a few po trials total stating she didn't want it, then saying she had a "sour stomach". MD updated.  Recommend continue a mech soft diet (w/ Minced meats, gravy to moisten for  ease of chewing) w/ Nectar liquids. Recommend aspiration precautions; pills in a Puree for safer swallowing (vs w/ liquids). Per MD ok, consider SINGLE ice chips in Between meals for Pleasure post thorough oral care - aspiration precautions. Tray setup at meals; assist w/ feeding and monitor at meals d/t weakness and Cognitive decline/Dementia and advanced age. Pt can continue w/ f/u w/ skilled ST services at her next venue of care in hopes to upgrade to thin liquids again in her diet once medical status has improved -- reduced acuity status. Recommend pt and family f/u w/ Palliative Care for [place] addressing advanced aging, swallowing, Dementia in case this is pt's new Baseline. NSG/MD updated. Handouts and Nectar drinks to be provided for d/c.     HPI HPI: Pt is a 86 y.o. female with medical history significant for Dementia, with sundowning, coronary artery disease status post LAD stent, followed by Dr. Humphrey Rolls, hyperlipidemia, Hiatal Hernia, GERD, and hypertension who was brought in for evaluation after she suffered what appears to be a mechanical fall.  Patient attempted to sit down and missed and fell onto her left hip and was unable to bear weight thereafter.  She had no loss of consciousness did not hit her head.  Chest x-ray showed no acute findings.  Urinalysis clear.  Tested positive for Covid after admission.  Pt is post op Day 2 of INTRAMEDULLARY (IM) NAIL INTERTROCHANTRIC (Left).  Pt is extremely agitated and combative but calmer and participated well w/ this SLP on day of this evaluation.       SLP Plan  All goals met       Recommendations  Diet recommendations: Dysphagia 3 (mechanical soft);Nectar-thick liquid(meats Minced w/ gravies) Liquids provided via: Cup;Straw(monitor) Medication  Administration: Whole meds with puree(for safer swallowing) Supervision: Patient able to self feed;Full supervision/cueing for compensatory strategies;Staff to assist with self feeding(support and  monitor) Compensations: Minimize environmental distractions;Slow rate;Small sips/bites;Lingual sweep for clearance of pocketing;Follow solids with liquid Postural Changes and/or Swallow Maneuvers: Seated upright 90 degrees;Upright 30-60 min after meal                General recommendations: (Dietician f/u; Palliative Care f/u for [place]) Oral Care Recommendations: Oral care BID;Oral care before and after PO;Staff/trained caregiver to provide oral care Follow up Recommendations: Skilled Nursing facility SLP Visit Diagnosis: Dysphagia, oropharyngeal phase (R13.12)(Cognitive decline Baseline) Plan: All goals met       GO                 [name] Kenner, MS, CCC-SLP [place] 08/03/2019, 1:22 PM

## 2019-08-04 DIAGNOSIS — F0280 Dementia in other diseases classified elsewhere without behavioral disturbance: Secondary | ICD-10-CM

## 2019-08-04 DIAGNOSIS — G309 Alzheimer's disease, unspecified: Secondary | ICD-10-CM

## 2019-08-04 DIAGNOSIS — W19XXXD Unspecified fall, subsequent encounter: Secondary | ICD-10-CM

## 2019-08-04 DIAGNOSIS — I10 Essential (primary) hypertension: Secondary | ICD-10-CM

## 2019-08-04 LAB — BASIC METABOLIC PANEL
Anion gap: 4 — ABNORMAL LOW (ref 5–15)
BUN: 26 mg/dL — ABNORMAL HIGH (ref 8–23)
CO2: 27 mmol/L (ref 22–32)
Calcium: 8.1 mg/dL — ABNORMAL LOW (ref 8.9–10.3)
Chloride: 105 mmol/L (ref 98–111)
Creatinine, Ser: 0.65 mg/dL (ref 0.44–1.00)
GFR calc Af Amer: >60 mL/min (ref >60)
GFR calc non Af Amer: >60 mL/min (ref >60)
Glucose, Bld: 91 mg/dL (ref 70–99)
Potassium: 4.2 mmol/L (ref 3.5–5.1)
Sodium: 136 mmol/L (ref 135–145)

## 2019-08-04 LAB — CBC
HCT: 27.6 % — ABNORMAL LOW (ref 36.0–46.0)
Hemoglobin: 8.8 g/dL — ABNORMAL LOW (ref 12.0–15.0)
MCH: 31.1 pg (ref 26.0–34.0)
MCHC: 31.9 g/dL (ref 30.0–36.0)
MCV: 97.5 fL (ref 80.0–100.0)
Platelets: 218 10*3/uL (ref 150–400)
RBC: 2.83 MIL/uL — ABNORMAL LOW (ref 3.87–5.11)
RDW: 14.4 % (ref 11.5–15.5)
WBC: 15.8 10*3/uL — ABNORMAL HIGH (ref 4.0–10.5)
nRBC: 0 % (ref 0.0–0.2)

## 2019-08-04 NOTE — Progress Notes (Signed)
PROGRESS NOTE    MARYETTE WOLBERT  [id number]  DOB: 05/31/1921  DOA: 07/25/2019 PCP: Jodi Marble, MD Outpatient Specialists:   Hospital course:  86 year old independent living female with full-time caregiver who has PMH significant for HTN, CAD and dementia with sundowning is admitted 07/25/2019 with left hip fracture status post mechanical fall.  Preop evaluation revealed positive Covid PCR.  Patient had no oxygen requirement and chest x-ray was without acute process.  Subjective:  No new complaints.  Would like to go home.  Objective: Vitals:   08/04/19 0500 08/04/19 0600 08/04/19 0700 08/04/19 0800  BP:    (!) 106/39  Pulse: (!) 58 67 72 (!) 54  Resp: 15 17 15    Temp:      TempSrc:      SpO2: 95% 97% 97%   Weight:      Height:       No intake or output data in the 24 hours ending 08/04/19 1418 Filed Weights   07/25/19 1827  Weight: 56.7 kg     Assessment & Plan:   86 year old independent living female with full-time caregiver and history of HTN, CAD and dementia with sundowning presents with left hip fracture after mechanical fall.  She is found to be Covid positive with negative chest x-ray no hypoxia.  Left hip fracture, proximal femur fracture POD #9 status post pinning PT OT are working with patient. Pain management and DVT prophylaxis per orthopedics Patient's family has arranged for full-time caregivers to take care of her at home, the are going to be tested for Covid and they will be able to accept her at home on Monday 08/06/2019.  Lethargy Patient's energy level waxes and wanes day by day Much more alert today  Constipation Nursing to continue work with the patient and constipation, multiple meds and enemas are available as warranted  Leukocytosis Resolving possibly secondary to constipation Patient remains afebrile UA is negative Patient has no oxygen requirement and chest x-ray remains negative Abdomen is soft  Anemia H&H have  stabilized.  COVID-19 infection Chest x-ray is without acute abnormalities, patient is not hypoxic. We will not start remdesivir as patient does not really have severe disease as per CDC/WHO guidelines. No indication for steroids given no hypoxia.  CAD (coronary artery disease) Does not appear to be on any CAD medications at home. Does not appear to be on any antiplatelets at home.  HTN She is relatively normotensive, does not appear to be on any BP meds at home.  Dementia Reorder Haldol per home regimen however will make it every 8 as needed rather than twice daily standing.   DVT prophylaxis: Lovenox Code Status: DNR Family Communication: Updated patient's son [name] over phone follow-up 4/10  disposition Plan:   Patient is from: Home  Anticipated Discharge Location: Home with full-time care and hospice/[facility]  Barriers to Discharge: Caregivers are getting Covid tested, results will be available on Monday and patient can be discharged to home on Monday.   Is patient medically stable for Discharge: Yes   Consultants:  Orthopedics  Cardiology for preoperative evaluation.  Procedures:  Left intertrochanteric/intramedullary nail  Antimicrobials:  None   Exam:  General: Patient is sitting up in bed, awake and alert.  She brightens when she sees me.   Eyes: sclera anicteric, conjuctiva mild injection bilaterally CVS: S1-S2, regular  Respiratory:  decreased air entry bilaterally secondary to decreased inspiratory effort, rales at bases  GI: Emaciated, scaphoid, positive bowel sounds.  Nontender. LE: No  edema.   Data Reviewed: Basic Metabolic Panel: Recent Labs  Lab 07/29/19 0607 07/30/19 0600 08/01/19 0604 08/02/19 0938 08/04/19 0650  NA 141 142 138 139 136  K 3.3* 3.8 3.4* 3.9 4.2  CL 108 108 106 106 105  CO2 28 28 27 27 27   GLUCOSE 97 81 86 83 91  BUN 23 26* 19 20 26*  CREATININE 0.78 0.75 0.56 0.48 0.65  CALCIUM 8.2* 8.3* 7.7* 8.0* 8.1*   Liver  Function Tests: No results for input(s): AST, ALT, ALKPHOS, BILITOT, PROT, ALBUMIN in the last 168 hours. No results for input(s): LIPASE, AMYLASE in the last 168 hours. No results for input(s): AMMONIA in the last 168 hours. CBC: Recent Labs  Lab 07/29/19 0607 07/31/19 0628 08/01/19 0604 08/04/19 0650  WBC 13.7* 10.1 10.1 15.8*  HGB 8.6* 7.8* 8.5* 8.8*  HCT 25.8* 23.5* 25.7* 27.6*  MCV 94.9 94.0 94.1 97.5  PLT 159 161 187 218   Cardiac Enzymes: No results for input(s): CKTOTAL, CKMB, CKMBINDEX, TROPONINI in the last 168 hours. BNP (last 3 results) No results for input(s): PROBNP in the last 8760 hours. CBG: No results for input(s): GLUCAP in the last 168 hours.  Recent Results (from the past 240 hour(s))  Respiratory Panel by RT PCR (Flu A&B, Covid) - Nasopharyngeal Swab     Status: Abnormal   Collection Time: 07/25/19  7:28 PM   Specimen: Nasopharyngeal Swab  Result Value Ref Range Status   SARS Coronavirus 2 by RT PCR POSITIVE (A) NEGATIVE Final    Comment: RESULT CALLED TO, READ BACK BY AND VERIFIED WITH: KATIE FERGUSON @ 2120 ON 07/25/2019 BY CAF (NOTE) SARS-CoV-2 target nucleic acids are DETECTED. SARS-CoV-2 RNA is generally detectable in upper respiratory specimens  during the acute phase of infection. Positive results are indicative of the presence of the identified virus, but do not rule out bacterial infection or co-infection with other pathogens not detected by the test. Clinical correlation with patient history and other diagnostic information is necessary to determine patient infection status. The expected result is Negative. Fact Sheet for Patients:  PinkCheek.be Fact Sheet for Healthcare Providers: GravelBags.it This test is not yet approved or cleared by the [place] FDA and  has been authorized for detection and/or diagnosis of SARS-CoV-2 by FDA under an Emergency Use Authorization (EUA).   This EUA will remain in effect (meaning this test can be u sed) for the duration of  the COVID-19 declaration under Section 564(b)(1) of the Act, 21 U.S.C. section 360bbb-3(b)(1), unless the authorization is terminated or revoked sooner.    Influenza A by PCR NEGATIVE NEGATIVE Final   Influenza B by PCR NEGATIVE NEGATIVE Final    Comment: (NOTE) The Xpert Xpress SARS-CoV-2/FLU/RSV assay is intended as an aid in  the diagnosis of influenza from Nasopharyngeal swab specimens and  should not be used as a sole basis for treatment. Nasal washings and  aspirates are unacceptable for Xpert Xpress SARS-CoV-2/FLU/RSV  testing. Fact Sheet for Patients: PinkCheek.be Fact Sheet for Healthcare Providers: GravelBags.it This test is not yet approved or cleared by the [place] FDA and  has been authorized for detection and/or diagnosis of SARS-CoV-2 by  FDA under an Emergency Use Authorization (EUA). This EUA will remain  in effect (meaning this test can be used) for the duration of the  Covid-19 declaration under Section 564(b)(1) of the Act, 21  U.S.C. section 360bbb-3(b)(1), unless the authorization is  terminated or revoked. Performed at [facility], [address]  [place] [place],  [address]       Studies: No results found.   Scheduled Meds: . aspirin EC  81 mg Oral Daily  . Chlorhexidine Gluconate Cloth  6 each Topical Daily  . docusate sodium  100 mg Oral BID  . enalapril  2.5 mg Oral Daily  . enoxaparin (LOVENOX) injection  40 mg Subcutaneous Q24H  . feeding supplement (NEPRO CARB STEADY)  237 mL Oral TID BM  . metoprolol succinate  25 mg Oral Daily  . multivitamin with minerals  1 tablet Oral Daily  . pneumococcal 23 valent vaccine  0.5 mL Intramuscular Tomorrow-1000   Continuous Infusions: . sodium chloride 100 mL/hr at 07/31/19 2352  . methocarbamol (ROBAXIN) IV      Principal Problem:   Fracture,  proximal femur, left, closed, initial encounter ([place]) Active Problems:   CAD (coronary artery disease)   Essential hypertension   Dementia without behavioral disturbance ([place])   Accidental fall   Preoperative clearance   Closed left hip fracture, initial encounter ([place])     Ricky Doan Manuella Ghazi, [facility]  If 7PM-7AM, please contact night-coverage [email or web address] Password [email or web address] 08/04/2019, 2:18 PM    LOS: 10 days

## 2019-08-04 NOTE — Progress Notes (Signed)
Physical Therapy Treatment Patient Details Name: [name] [name] MRN: [id number] DOB: [birth year] Today's Date: 08/04/2019    History of Present Illness 86 year old independent living female with full-time caregiver who has PMH significant for HTN, CAD and dementia with sundowning is admitted 07/25/2019 with left hip fracture status post mechanical fall, s/p IM nailing (07/26/19), WBAT.  Preop evaluation revealed positive Covid PCR.  Patient had no oxygen requirement and chest x-ray was without acute process.    PT Comments    Pt was long sitting in bed and very excited to have someone in her room. She reports feeling lonely and asked 4 x throughout session when she will be going home. She was seated in her own BM and requesting to get OOB to [facility] to have another BM. She was able to exit R side of bed with increased time and min assist. She stood and took 3 steps to [facility] with RW + mod assist. Had small BM prior to standing and returning to bed. She was repositioned in bed prior to performing there ex. See exercises performed below. Pt tolerated session well and is progressing well with PT however was limited by fatigue. Therapist recommends DC to SNF however pt/pt's family is planning to take pt home with 24 hour supervision. Therapist recommend pt have hospital bed, WC, 3 in1, and RW in place. Acute PT will continue to follow per POC    Follow Up Recommendations  SNF;Supervision for mobility/OOB;Supervision/Assistance - 24 hour     Equipment Recommendations  Rolling walker with 5" wheels;3in1 (PT);Hospital bed;Wheelchair (measurements PT);Wheelchair cushion (measurements PT)    Recommendations for Other Services       Precautions / Restrictions Precautions Precautions: Fall Precaution Comments: [id number]: airborne/isolation precautions Restrictions Weight Bearing Restrictions: Yes LLE Weight Bearing: Weight bearing as tolerated    Mobility  Bed Mobility Overal bed mobility: Needs  Assistance Bed Mobility: Supine to Sit     Supine to sit: Min assist Sit to supine: Max assist;HOB elevated   General bed mobility comments: pt requires increased time to exit bed. only min assist for trunk support to achieve EOB sitting. Pt has BM in bed and requested to use BSC. max assist returning to supine with HOBelevated from EOB sitting  Transfers Overall transfer level: Needs assistance Equipment used: Rolling walker (2 wheeled) Transfers: Stand Pivot Transfers   Stand pivot transfers: Mod assist;From elevated surface       General transfer comment: pt performed stand pivot on/off BSC with mod assist + vcs. she was able to stand 1 x from [facility] mainting standing long enough for therapist to wipe prior to pivoting to EOB. Overall pt demonstartes improved overall strength. slightly SOB after but vitals stable. BP once returned to bed 119/56  Ambulation/Gait Ambulation/Gait assistance: Mod assist Gait Distance (Feet): 3 Feet Assistive device: Rolling walker (2 wheeled) Gait Pattern/deviations: Step-to pattern;Trunk flexed Gait velocity: decreased    General Gait Details: pt was able to take ~ 3 steps to pivot on/off BSC   Stairs             Wheelchair Mobility    Modified Rankin (Stroke Patients Only)       Balance Overall balance assessment: Needs assistance Sitting-balance support: No upper extremity supported;Feet supported Sitting balance-Leahy Scale: Fair     Standing balance support: Bilateral upper extremity supported Standing balance-Leahy Scale: Poor Standing balance comment: heavy reliance on RW for support  Cognition Arousal/Alertness: Awake/alert Behavior During Therapy: WFL for tasks assessed/performed Overall Cognitive Status: History of cognitive impairments - at baseline                                 General Comments: Pt was very excited to have someone in her room. She voices several  times that she is lonely and wants to go home. asked 4 x throughout session when she could go home. Pt is alert however pleasnatly confused.       Exercises Total Joint Exercises Ankle Circles/Pumps: Both;20 reps Quad Sets: AROM;10 reps;Supine Gluteal Sets: AROM;10 reps Heel Slides: AROM;Left;10 reps Hip ABduction/ADduction: AROM;10 reps;Supine Straight Leg Raises: AAROM;Left;10 reps    General Comments        Pertinent Vitals/Pain Pain Assessment: Faces Faces Pain Scale: Hurts a little bit Pain Location: L hip with movement Pain Descriptors / Indicators: Grimacing;Guarding;Discomfort Pain Intervention(s): Limited activity within patient's tolerance;Monitored during session;Repositioned    Home Living                      Prior Function            PT Goals (current goals can now be found in the care plan section) Acute Rehab PT Goals Patient Stated Goal: To go home Progress towards PT goals: Progressing toward goals    Frequency    7X/week      PT Plan Current plan remains appropriate;Other (comment)(pt is planning to DC to home with 24 hour assistance)    Co-evaluation     PT goals addressed during session: Mobility/safety with mobility        AM-PAC PT "6 Clicks" Mobility   Outcome Measure  Help needed turning from your back to your side while in a flat bed without using bedrails?: A Lot Help needed moving from lying on your back to sitting on the side of a flat bed without using bedrails?: A Lot Help needed moving to and from a bed to a chair (including a wheelchair)?: A Lot Help needed standing up from a chair using your arms (e.g., wheelchair or bedside chair)?: A Lot Help needed to walk in hospital room?: A Lot Help needed climbing 3-5 steps with a railing? : Total 6 Click Score: 11    End of Session Equipment Utilized During Treatment: Gait belt Activity Tolerance: Patient tolerated treatment well Patient left: in bed;with call  bell/phone within reach;with bed alarm set Nurse Communication: Mobility status PT Visit Diagnosis: Muscle weakness (generalized) (M62.81);Difficulty in walking, not elsewhere classified (R26.2);Pain Pain - Right/Left: Left Pain - part of body: Hip     Time: [id number] PT Time Calculation (min) (ACUTE ONLY): 25 min  Charges:  $Therapeutic Exercise: 8-22 mins $Therapeutic Activity: 8-22 mins                     Julaine Fusi PTA 08/04/19, 4:55 PM

## 2019-08-05 NOTE — Progress Notes (Signed)
PROGRESS NOTE    [name] [name]  [id number]  DOB: 1921-05-04  DOA: 07/25/2019 PCP: Jodi Marble, MD Outpatient Specialists:   Hospital course:  86 year old independent living female with full-time caregiver who has PMH significant for HTN, CAD and dementia with sundowning is admitted 07/25/2019 with left hip fracture status post mechanical fall.  Preop evaluation revealed positive Covid PCR.  Patient had no oxygen requirement and chest x-ray was without acute process.  Subjective:  No new complaints, comfortable  Objective: Vitals:   08/04/19 1653 08/04/19 1654 08/04/19 1655 08/05/19 0800  BP:   (!) 119/44 (!) 154/50  Pulse: 83 83 81 74  Resp: 19 20 19 17   Temp:   98.7 F (37.1 C) 97.8 F (36.6 C)  TempSrc:   Axillary Axillary  SpO2: 94% 94% 93% 97%  Weight:      Height:        Intake/Output Summary (Last 24 hours) at 08/05/2019 1156 Last data filed at 08/04/2019 2025 Gross per 24 hour  Intake 120 ml  Output -  Net 120 ml   Filed Weights   07/25/19 1827  Weight: 56.7 kg     Assessment & Plan:   86 year old independent living female with full-time caregiver and history of HTN, CAD and dementia with sundowning presents with left hip fracture after mechanical fall.  She is found to be Covid positive with negative chest x-ray no hypoxia.  Left hip fracture, proximal femur fracture POD #10 status post pinning PT OT are working with patient. Pain management and DVT prophylaxis per orthopedics Patient's family has arranged for full-time caregivers to take care of her at home, the are going to be tested for Covid and they will be able to accept her at home on Monday 08/06/2019.  Lethargy Patient's energy level waxes and wanes day by day Much more alert today  Constipation Nursing to continue work with the patient and constipation, multiple meds and enemas are available as warranted  Leukocytosis Resolving possibly secondary to constipation Patient  remains afebrile UA is negative Patient has no oxygen requirement and chest x-ray remains negative Abdomen is soft  Anemia H&H have stabilized.  COVID-19 infection Chest x-ray is without acute abnormalities, patient is not hypoxic. We will not start remdesivir as patient does not really have severe disease as per CDC/WHO guidelines. No indication for steroids given no hypoxia.  CAD (coronary artery disease) Does not appear to be on any CAD medications at home. Does not appear to be on any antiplatelets at home.  HTN She is relatively normotensive, does not appear to be on any BP meds at home.  Dementia Reorder Haldol per home regimen however will make it every 8 as needed rather than twice daily standing.   DVT prophylaxis: Lovenox Code Status: DNR Family Communication: Updated patient's son [name] over phone follow-up 4/10  disposition Plan:   Patient is from: Home  Anticipated Discharge Location: Home with full-time care and hospice/[facility]  Barriers to Discharge: Caregivers are getting Covid tested, results will be available on Monday and patient can be discharged to home on Monday/tomorrow   Is patient medically stable for Discharge: Yes   Consultants:  Orthopedics  Cardiology for preoperative evaluation.  Procedures:  Left intertrochanteric/intramedullary nail  Antimicrobials:  None   Exam:  General: Patient is sitting up in bed, awake and alert.  She brightens when she sees me.   Eyes: sclera anicteric, conjuctiva mild injection bilaterally CVS: S1-S2, regular  Respiratory:  decreased air entry  bilaterally secondary to decreased inspiratory effort, rales at bases  GI: Emaciated, scaphoid, positive bowel sounds.  Nontender. LE: No edema.   Data Reviewed: Basic Metabolic Panel: Recent Labs  Lab 07/30/19 0600 08/01/19 0604 08/02/19 0938 08/04/19 0650  NA 142 138 139 136  K 3.8 3.4* 3.9 4.2  CL 108 106 106 105  CO2 28 27 27 27   GLUCOSE 81 86 83  91  BUN 26* 19 20 26*  CREATININE 0.75 0.56 0.48 0.65  CALCIUM 8.3* 7.7* 8.0* 8.1*   Liver Function Tests: No results for input(s): AST, ALT, ALKPHOS, BILITOT, PROT, ALBUMIN in the last 168 hours. No results for input(s): LIPASE, AMYLASE in the last 168 hours. No results for input(s): AMMONIA in the last 168 hours. CBC: Recent Labs  Lab 07/31/19 0628 08/01/19 0604 08/04/19 0650  WBC 10.1 10.1 15.8*  HGB 7.8* 8.5* 8.8*  HCT 23.5* 25.7* 27.6*  MCV 94.0 94.1 97.5  PLT 161 187 218   Cardiac Enzymes: No results for input(s): CKTOTAL, CKMB, CKMBINDEX, TROPONINI in the last 168 hours. BNP (last 3 results) No results for input(s): PROBNP in the last 8760 hours. CBG: No results for input(s): GLUCAP in the last 168 hours.  No results found for this or any previous visit (from the past 240 hour(s)).    Studies: No results found.   Scheduled Meds: . aspirin EC  81 mg Oral Daily  . Chlorhexidine Gluconate Cloth  6 each Topical Daily  . docusate sodium  100 mg Oral BID  . enalapril  2.5 mg Oral Daily  . enoxaparin (LOVENOX) injection  40 mg Subcutaneous Q24H  . feeding supplement (NEPRO CARB STEADY)  237 mL Oral TID BM  . metoprolol succinate  25 mg Oral Daily  . multivitamin with minerals  1 tablet Oral Daily  . pneumococcal 23 valent vaccine  0.5 mL Intramuscular Tomorrow-1000   Continuous Infusions: . sodium chloride 100 mL/hr at 07/31/19 2352  . methocarbamol (ROBAXIN) IV      Principal Problem:   Fracture, proximal femur, left, closed, initial encounter ([place]) Active Problems:   CAD (coronary artery disease)   Essential hypertension   Dementia without behavioral disturbance ([place])   Accidental fall   Preoperative clearance   Closed left hip fracture, initial encounter ([place])     Shekinah Pitones Manuella Ghazi, [facility]  If 7PM-7AM, please contact night-coverage [email or web address] Password [facility] 08/05/2019, 11:56 AM    LOS: 11 days

## 2019-08-05 NOTE — Progress Notes (Signed)
Physical Therapy Treatment Patient Details Name: SANJA OSSMAN MRN: [id number] DOB: [birth year] Today's Date: 08/05/2019    History of Present Illness 86 year old independent living female with full-time caregiver who has PMH significant for HTN, CAD and dementia with sundowning is admitted 07/25/2019 with left hip fracture status post mechanical fall, s/p IM nailing (07/26/19), WBAT.  Preop evaluation revealed positive Covid PCR.  Patient had no oxygen requirement and chest x-ray was without acute process.    PT Comments    Pt in bed, agrees to session but stating she is hungry.  Lunch in room but untouched.  Assisted with 1 container of yogurt, 1 1/2 cups thickened liquids a few bits of apple sauce and 1/2 pudding.  Alternating bits and sips per SLP recommendations.  After eating she agrees to supine ex.  Pt asking when she is going home and perks up a lot and is more engaged when she is told plan is to go home tomorrow "Are you for real?"  Reviewed discharge plan is to go home with caregivers if no problems interfere according to SWS notes.  Pt excited.  She becomes more talkative and engaged.  She stated she is lonely in her room and gets scared at night with no lights on.  Will relay to RN to see if they can leave bathroom door open at night tonight with light on.     Follow Up Recommendations  SNF;Supervision for mobility/OOB;Supervision/Assistance - 24 hour     Equipment Recommendations  Rolling walker with 5" wheels;3in1 (PT);Hospital bed;Wheelchair (measurements PT);Wheelchair cushion (measurements PT)    Recommendations for Other Services       Precautions / Restrictions Precautions Precautions: Fall Precaution Comments: [id number]: airborne/isolation precautions Restrictions Weight Bearing Restrictions: Yes LLE Weight Bearing: Weight bearing as tolerated    Mobility  Bed Mobility                  Transfers                    Ambulation/Gait                 Stairs             Wheelchair Mobility    Modified Rankin (Stroke Patients Only)       Balance                                            Cognition Arousal/Alertness: Awake/alert Behavior During Therapy: WFL for tasks assessed/performed Overall Cognitive Status: History of cognitive impairments - at baseline                                        Exercises Total Joint Exercises Ankle Circles/Pumps: Both;20 reps Quad Sets: AROM;10 reps;Supine Gluteal Sets: AROM;10 reps Heel Slides: AROM;Left;10 reps Hip ABduction/ADduction: AROM;10 reps;Supine Straight Leg Raises: AAROM;Left;10 reps    General Comments        Pertinent Vitals/Pain Pain Assessment: No/denies pain    Home Living                      Prior Function            PT Goals (current goals can now be found in the care plan section) Progress towards PT  goals: Progressing toward goals    Frequency    7X/week      PT Plan Current plan remains appropriate;Other (comment)    Co-evaluation              AM-PAC PT "6 Clicks" Mobility   Outcome Measure  Help needed turning from your back to your side while in a flat bed without using bedrails?: A Lot Help needed moving from lying on your back to sitting on the side of a flat bed without using bedrails?: A Lot Help needed moving to and from a bed to a chair (including a wheelchair)?: A Lot Help needed standing up from a chair using your arms (e.g., wheelchair or bedside chair)?: A Lot Help needed to walk in hospital room?: A Lot Help needed climbing 3-5 steps with a railing? : Total 6 Click Score: 11    End of Session   Activity Tolerance: Patient tolerated treatment well Patient left: in bed;with call bell/phone within reach;with bed alarm set Nurse Communication: Mobility status Pain - Right/Left: Left Pain - part of body: Hip     Time: [id number] PT Time Calculation (min)  (ACUTE ONLY): 43 min  Charges:  $Therapeutic Exercise: 8-22 mins $Therapeutic Activity: 23-37 mins                    Salia Abundiz, PTA 08/05/19, 3:28 PM

## 2019-08-06 DIAGNOSIS — Z515 Encounter for palliative care: Secondary | ICD-10-CM

## 2019-08-06 MED ORDER — MORPHINE SULFATE (CONCENTRATE) 10 MG /0.5 ML PO SOLN: 5.0000 mg | ORAL | 0 refills | Status: AC | PRN

## 2019-08-06 NOTE — Plan of Care (Signed)
Discharged to home via ambulance. All belongings with patient. No IV access to remove

## 2019-08-06 NOTE — Discharge Summary (Signed)
[name] [name] at [place] NAME: [name] [name]    MR#:  [id number]  DATE OF BIRTH:  [birth year]  DATE OF ADMISSION:  07/25/2019   ADMITTING PHYSICIAN: Athena Masse, MD  DATE OF DISCHARGE: 08/06/2019  PRIMARY CARE PHYSICIAN: Jodi Marble, MD   ADMISSION DIAGNOSIS:  Surgery, elective [Z41.9] Fall, initial encounter [name].[name].XXXA] Closed left hip fracture, initial encounter ([name] [name]) [S72.002A] DISCHARGE DIAGNOSIS:  Principal Problem:   Fracture, proximal femur, left, closed, initial encounter ([name] [name]) Active Problems:   CAD (coronary artery disease)   Essential hypertension   Dementia without behavioral disturbance ([name] [name])   Accidental fall   Preoperative clearance   Closed left hip fracture, initial encounter [name])   Hospice care patient  SECONDARY DIAGNOSIS:   Past Medical History:  Diagnosis Date  . Cancer ([name] [name])    breast  . Chronic kidney disease   . Coronary artery disease   . GERD (gastroesophageal reflux disease)   . Gout   . HH (hiatus hernia)   . Hypercholesteremia   . Hypertension   . Murmur   . Palpitations    HOSPITAL COURSE:  86 year old independent living female with full-time caregiver and history of HTN, CAD and dementia with sundowning presents with left hip fracture after mechanical fall.  She is found to be Covid positive with negative chest x-ray no hypoxia.  Left hip fracture, proximal femur fracture POD #11 status post pinning  Lethargy Patient's energy level waxes and wanes day by day  Constipation resolved  Leukocytosis Resolving possibly secondary to constipation Patient remains afebrile UA is negative Patient has no oxygen requirement and chest x-ray remains negative Abdomen is soft  Anemia H&H have stabilized.  COVID-19 infection Chest x-ray is without acute abnormalities, patient is not hypoxic. no remdesivir as patient does not really have severe disease as per CDC/WHO guidelines. No indication for  steroids given no hypoxia.  CAD (coronary artery disease) Does not appear to be on any CAD medications at home. Does not appear to be on any antiplatelets at home.  HTN She is relatively normotensive, does not appear to be on any BP meds at home.  Dementia Haldol prn   DISCHARGE CONDITIONS:  fair CONSULTS OBTAINED:  Treatment Team:  Leim Fabry, MD Dionisio David, MD DRUG ALLERGIES:  No Known Allergies DISCHARGE MEDICATIONS:   Allergies as of 08/06/2019   No Known Allergies     Medication List    TAKE these medications   enoxaparin 40 MG/0.4ML injection Commonly known as: LOVENOX Inject 0.4 mLs (40 mg total) into the skin daily for 27 days. DC 08/24/2019   haloperidol 2 MG/ML solution Commonly known as: HALDOL Take 1 mg by mouth 2 (two) times daily.   LORazepam 0.5 MG tablet Commonly known as: ATIVAN Take 0.5 mg by mouth every 4 (four) hours as needed for anxiety or sleep.   morphine CONCENTRATE 10 mg / 0.5 ml concentrated solution Take 0.25 mLs (5 mg total) by mouth every 2 (two) hours as needed for severe pain.            Durable Medical Equipment  (From admission, onward)         Start     Ordered   08/02/19 1556  For home use only DME standard manual wheelchair with seat cushion  Once    Comments: Patient suffers from dementia which impairs their ability to perform daily activities like bathing, dressing, grooming in the home.  A walker will not resolve issue  with performing activities of daily living. A wheelchair will allow patient to safely perform daily activities. Patient can safely propel the wheelchair in the home or has a caregiver who can provide assistance. Length of need Lifetime. Accessories: elevating leg rests (ELRs), wheel locks, extensions and anti-tippers.   08/02/19 1556   08/02/19 1555  For home use only DME Bedside commode  Once    Question:  Patient needs a bedside commode to treat with the following condition  Answer:  Hip  fracture ([name] [name])   08/02/19 1556   08/02/19 1555  For home use only DME 4 wheeled rolling walker with seat  Once    Question:  Patient needs a walker to treat with the following condition  Answer:  Gait instability   08/02/19 1556   08/02/19 1554  For home use only DME Hospital bed  Once    Question Answer Comment  Length of Need Lifetime   The above medical condition requires: Patient requires the ability to reposition frequently   Bed type Semi-electric   Support Surface: Gel Overlay      08/02/19 1556         DISCHARGE INSTRUCTIONS:  Mech Soft diet (w/ Minced meats, gravy to moisten for ease of chewing) w/ Nectar liquids. Recommend aspiration precautions; pills in a Puree for safer swallowing (vs w/ liquids). Per her MD ok, consider SINGLE ice chips in Between meals for Pleasure post thorough oral care - aspiration precautions. Tray setup at meals; assist w/ feeding and monitor at meals d/t weakness, Cognitive decline/Dementia, and advanced age. DIET:  Regular diet DISCHARGE CONDITION:  Fair ACTIVITY:  Activity as tolerated OXYGEN:  Home Oxygen: No.  Oxygen Delivery: room air DISCHARGE LOCATION:  Home with Hospice   If you experience worsening of your admission symptoms, develop shortness of breath, life threatening emergency, suicidal or homicidal thoughts you must seek medical attention immediately by calling 911 or calling your MD immediately  if symptoms less severe.  You Must read complete instructions/literature along with all the possible adverse reactions/side effects for all the Medicines you take and that have been prescribed to you. Take any new Medicines after you have completely understood and accpet all the possible adverse reactions/side effects.   Please note  You were cared for by a hospitalist during your hospital stay. If you have any questions about your discharge medications or the care you received while you were in the hospital after you are discharged,  you can call the unit and asked to speak with the hospitalist on call if the hospitalist that took care of you is not available. Once you are discharged, your primary care physician will handle any further medical issues. Please note that NO REFILLS for any discharge medications will be authorized once you are discharged, as it is imperative that you return to your primary care physician (or establish a relationship with a primary care physician if you do not have one) for your aftercare needs so that they can reassess your need for medications and monitor your lab values.    On the day of Discharge:  VITAL SIGNS:  Blood pressure (!) 122/37, pulse (!) 59, temperature 98.5 F (36.9 C), temperature source Oral, resp. rate 16, height 5\' 6"  (1.676 m), weight 56.7 kg, SpO2 97 %. PHYSICAL EXAMINATION:  GENERAL:  86 y.o.-year-old patient lying in the bed with no acute distress.  EYES: Pupils equal, round, reactive to light and accommodation. No scleral icterus. Extraocular muscles intact.  HEENT: Head atraumatic,  normocephalic. Oropharynx and nasopharynx clear.  NECK:  Supple, no jugular venous distention. No thyroid enlargement, no tenderness.  LUNGS: Normal breath sounds bilaterally, no wheezing, rales,rhonchi or crepitation. No use of accessory muscles of respiration.  CARDIOVASCULAR: S1, S2 normal. No murmurs, rubs, or gallops.  ABDOMEN: Soft, non-tender, non-distended. Bowel sounds present. No organomegaly or mass.  EXTREMITIES: No pedal edema, cyanosis, or clubbing.  NEUROLOGIC: Cranial nerves II through XII are intact. Muscle strength 5/5 in all extremities. Sensation intact. Gait not checked.  PSYCHIATRIC: The patient is alert and oriented x 3.  SKIN: No obvious rash, lesion, or ulcer.  DATA REVIEW:   CBC Recent Labs  Lab 08/04/19 0650  WBC 15.8*  HGB 8.8*  HCT 27.6*  PLT 218    Chemistries  Recent Labs  Lab 08/04/19 0650  NA 136  K 4.2  CL 105  CO2 27  GLUCOSE 91  BUN 26*    CREATININE 0.65  CALCIUM 8.1*     Outpatient follow-up  Contact information for follow-up providers    Leim Fabry, MD Follow up in 6 week(s).   Specialty: Orthopedic Surgery Contact information: [place] [address] [phone]        Jodi Marble, MD. Schedule an appointment as soon as possible for a visit in 1 week(s).   Specialty: Internal Medicine Contact information: [place] [address] [phone]            Contact information for after-discharge care    Destination    [facility] Preferred SNF .   Service: Skilled Nursing Contact information: [address] [place] [address] [phone]                   Management plans discussed with the patient, family and they are in agreement.  CODE STATUS: DNR   TOTAL TIME TAKING CARE OF THIS PATIENT: 45 minutes.    Max Sane M.D on 08/06/2019 at 9:08 AM  [facility]   CC: Primary care physician; Jodi Marble, MD   Note: This dictation was prepared with Dragon dictation along with smaller phrase technology. Any transcriptional errors that result from this process are unintentional.

## 2019-08-06 NOTE — Discharge Instructions (Signed)
Hospice Hospice is a service that is designed to provide people who are terminally ill and their families with medical, spiritual, and psychological support. Its aim is to improve your quality of life by keeping you as comfortable as possible in the final stages of life. Who will be my providers when I begin hospice care? Hospice teams often include:  A nurse.  A doctor. The hospice doctor will be available for your care, but you can include your regular doctor or nurse practitioner.  A social worker.  A counselor.  A religious leader (such as a chaplain).  A dietitian.  Therapists.  Trained volunteers who can help with care. What services does hospice provide? Hospice services can vary depending on the center or organization. Generally, they include:  Ways to keep you comfortable, such as: ? Providing care in your home or in a home-like setting. ? Working with your family and friends to help meet your needs. ? Allowing you to enjoy the support of loved ones by receiving much of your basic care from family and friends.  Pain relief and symptom management. The staff will supply all necessary medicines and equipment so that you can stay comfortable and alert enough to enjoy the company of your friends and family.  Visits or care from a nurse and doctor. This may include 24-hour on-call services.  Companionship when you are alone.  Allowing you and your family to rest. Hospice staff may do light housekeeping, prepare meals, and run errands.  Counseling. They will make sure your emotional, spiritual, and social needs are being met, as well as those needs of your family members.  Spiritual care. This will be individualized to meet your needs and your family's needs. It may involve: ? Helping you and your family understand the dying process. ? Helping you say goodbye to your family and friends. ? Performing a specific religious ceremony or ritual.  Massage.  Nutrition  therapy.  Physical and occupational therapy.  Short-term inpatient care, if something cannot be managed in the home.  Art or music therapy.  Bereavement support for grieving family members. When should hospice care begin? Most people who use hospice are believed to have less than 6 months to live.  Your family and health care providers can help you decide when hospice services should begin.  If you live longer than 6 months but your condition does not improve, your doctor may be able to approve you for continued hospice care.  If your condition improves, you may discontinue the program. What should I consider before selecting a program? Most hospice programs are run by nonprofit, independent organizations. Some are affiliated with hospitals, nursing homes, or home health care agencies. Hospice programs can take place in your home or at a hospice center, hospital, or skilled nursing facility. When choosing a hospice program, ask the following questions:  What services are available to me?  What services will be offered to my loved ones?  How involved will my loved ones be?  How involved will my health care provider be?  Who makes up the hospice care team? How are they trained or screened?  How will my pain and symptoms be managed?  If my circumstances change, can the services be provided in a different setting, such as my home or in the hospital?  Is the program reviewed and licensed by the state or certified in some other way?  What does it cost? Is it covered by insurance?  If I choose a hospice   center or nursing home, where is the hospice center located? Is it convenient for family and friends?  If I choose a hospice center or nursing home, can my family and friends visit any time?  Will you provide emotional and spiritual support?  Who can my family call with questions? Where can I learn more about hospice? You can learn about existing hospice programs in your area  from your health care providers. You can also read more about hospice online. The websites of the following organizations have helpful information:  [facility] and Palliative Care Organization [facility]): http://www.brown-buchanan.com/  National Association for [facility]): http://massey-hart.com/  Hospice Foundation of [place] ([place]): www.hospicefoundation.org  American Cancer Society (ACS): www.cancer.org  Hospice Net: www.hospicenet.org  Visiting Nurse Associations of [place] (VNAA): www.vnaa.org You may also find more information by contacting the following agencies:  A local agency on aging.  Your local [employer] chapter.  Your state's department of health or social services. Summary  Hospice is a service that is designed to provide people who are terminally ill and their families with medical, spiritual, and psychological support.  Hospice aims to improve your quality of life by keeping you as comfortable as possible in the final stages of life.  Hospice teams often include a doctor, nurse, social worker, counselor, religious leader,dietitian, therapists, and volunteers.  Hospice care generally includes medicine for symptom management, visits from doctors and nurses, physical and occupational therapy, nutrition counseling, spiritual and emotional counseling, caregiver support, and bereavement support for grieving family members.  Hospice programs can take place in your home or at a hospice center, hospital, or skilled nursing facility. This information is not intended to replace advice given to you by your health care provider. Make sure you discuss any questions you have with your health care provider. Document Revised: 01/03/2019 Document Reviewed: 05/04/2016 Elsevier Patient Education  [place].

## 2019-08-06 NOTE — TOC Transition Note (Signed)
Transition of Care [facility]) - CM/SW Discharge Note   Patient Details  Name: JAMEY REMER MRN: [id number] Date of Birth: 1922/03/18  Transition of Care [facility]) CM/SW Contact:  Elease Hashimoto, LCSW Phone Number: 08/06/2019, 9:06 AM   Clinical Narrative:   Pt's  24 hr caregivers arranged and pt is set up with [facility] to go home today. Her equipment was delivered to home over the weekend and she is ready for DC home. Bedside RN aware of time set up for EMS 1:00 pm today.    Final next level of care: Home w Hospice Care Barriers to Discharge: Barriers Resolved   Patient Goals and CMS Choice Patient states their goals for this hospitalization and ongoing recovery are:: Get settled into ALF or memory care after rehab      Discharge Placement                Patient to be transferred to facility by: EMS Name of family member notified: karen-daughter in-law Patient and family notified of of transfer: 08/06/19  Discharge Plan and Services                DME Arranged: 3-N-1, Gel overlay, Hospital bed, Lightweight manual wheelchair with seat cushion DME Agency: Other - Comment([facility])       [place] Arranged: RN, PT, Nurse's Aide [place] Agency: Other - See comment([facility]) Date HH Agency Contacted: 08/01/19 Time [place]: 1000 Representative spoke with at [place]: [place] ([place]) Interventions     Readmission Risk Interventions No flowsheet data found.

## 2019-11-25 DEATH — deceased
